# Patient Record
Sex: Male | Born: 1960 | Race: White | Hispanic: No | Marital: Single | Smoking: Former smoker
Health system: Southern US, Community
[De-identification: ages and names within clinical notes are randomized; demographics above are authoritative.]

## PROBLEM LIST (undated history)

## (undated) DIAGNOSIS — J45909 Unspecified asthma, uncomplicated: Secondary | ICD-10-CM

## (undated) DIAGNOSIS — J449 Chronic obstructive pulmonary disease, unspecified: Secondary | ICD-10-CM

---

## 2010-09-05 ENCOUNTER — Emergency Department: Payer: Self-pay | Admitting: Emergency Medicine

## 2011-08-06 DIAGNOSIS — M25579 Pain in unspecified ankle and joints of unspecified foot: Secondary | ICD-10-CM | POA: Diagnosis not present

## 2011-08-06 DIAGNOSIS — Z79899 Other long term (current) drug therapy: Secondary | ICD-10-CM | POA: Diagnosis not present

## 2011-08-06 DIAGNOSIS — J449 Chronic obstructive pulmonary disease, unspecified: Secondary | ICD-10-CM | POA: Diagnosis not present

## 2011-08-06 DIAGNOSIS — J984 Other disorders of lung: Secondary | ICD-10-CM | POA: Diagnosis not present

## 2011-08-06 DIAGNOSIS — J4489 Other specified chronic obstructive pulmonary disease: Secondary | ICD-10-CM | POA: Diagnosis not present

## 2011-08-06 DIAGNOSIS — R911 Solitary pulmonary nodule: Secondary | ICD-10-CM | POA: Diagnosis not present

## 2011-08-08 DIAGNOSIS — R918 Other nonspecific abnormal finding of lung field: Secondary | ICD-10-CM | POA: Diagnosis not present

## 2011-08-08 DIAGNOSIS — Z77090 Contact with and (suspected) exposure to asbestos: Secondary | ICD-10-CM | POA: Diagnosis not present

## 2011-08-08 DIAGNOSIS — J984 Other disorders of lung: Secondary | ICD-10-CM | POA: Diagnosis not present

## 2011-08-08 DIAGNOSIS — J449 Chronic obstructive pulmonary disease, unspecified: Secondary | ICD-10-CM | POA: Diagnosis not present

## 2011-08-08 DIAGNOSIS — F17200 Nicotine dependence, unspecified, uncomplicated: Secondary | ICD-10-CM | POA: Diagnosis not present

## 2011-08-08 DIAGNOSIS — R0989 Other specified symptoms and signs involving the circulatory and respiratory systems: Secondary | ICD-10-CM | POA: Diagnosis not present

## 2014-01-19 ENCOUNTER — Ambulatory Visit: Payer: Self-pay | Admitting: Family Medicine

## 2014-01-19 DIAGNOSIS — K5289 Other specified noninfective gastroenteritis and colitis: Secondary | ICD-10-CM | POA: Diagnosis not present

## 2014-01-19 LAB — CBC WITH DIFFERENTIAL/PLATELET
Basophil #: 0.1 10*3/uL (ref 0.0–0.1)
Basophil %: 1.3 %
EOS ABS: 0.2 10*3/uL (ref 0.0–0.7)
Eosinophil %: 2.8 %
HCT: 42.7 % (ref 40.0–52.0)
HGB: 14.1 g/dL (ref 13.0–18.0)
LYMPHS PCT: 41.2 %
Lymphocyte #: 2.3 10*3/uL (ref 1.0–3.6)
MCH: 30 pg (ref 26.0–34.0)
MCHC: 33 g/dL (ref 32.0–36.0)
MCV: 91 fL (ref 80–100)
MONOS PCT: 8.4 %
Monocyte #: 0.5 x10 3/mm (ref 0.2–1.0)
NEUTROS ABS: 2.6 10*3/uL (ref 1.4–6.5)
NEUTROS PCT: 46.3 %
Platelet: 268 10*3/uL (ref 150–440)
RBC: 4.7 10*6/uL (ref 4.40–5.90)
RDW: 13.7 % (ref 11.5–14.5)
WBC: 5.6 10*3/uL (ref 3.8–10.6)

## 2014-01-19 LAB — BASIC METABOLIC PANEL
ANION GAP: 7 (ref 7–16)
BUN: 11 mg/dL (ref 7–18)
CALCIUM: 8.7 mg/dL (ref 8.5–10.1)
CREATININE: 0.94 mg/dL (ref 0.60–1.30)
Chloride: 106 mmol/L (ref 98–107)
Co2: 31 mmol/L (ref 21–32)
EGFR (African American): >60
EGFR (Non-African Amer.): >60
GLUCOSE: 96 mg/dL (ref 65–99)
Osmolality: 286 (ref 275–301)
Potassium: 4.1 mmol/L (ref 3.5–5.1)
SODIUM: 144 mmol/L (ref 136–145)

## 2014-01-19 LAB — OCCULT BLOOD X 1 CARD TO LAB, STOOL: Occult Blood, Feces: NEGATIVE

## 2014-07-19 ENCOUNTER — Ambulatory Visit: Payer: Self-pay

## 2014-07-19 DIAGNOSIS — M5430 Sciatica, unspecified side: Secondary | ICD-10-CM | POA: Diagnosis not present

## 2017-04-27 DIAGNOSIS — Z23 Encounter for immunization: Secondary | ICD-10-CM | POA: Diagnosis not present

## 2017-04-27 DIAGNOSIS — F17210 Nicotine dependence, cigarettes, uncomplicated: Secondary | ICD-10-CM | POA: Diagnosis not present

## 2017-04-27 DIAGNOSIS — S66321A Laceration of extensor muscle, fascia and tendon of left index finger at wrist and hand level, initial encounter: Secondary | ICD-10-CM | POA: Diagnosis not present

## 2017-04-27 DIAGNOSIS — T797XXA Traumatic subcutaneous emphysema, initial encounter: Secondary | ICD-10-CM | POA: Diagnosis not present

## 2017-04-27 DIAGNOSIS — Z96669 Presence of unspecified artificial ankle joint: Secondary | ICD-10-CM | POA: Diagnosis not present

## 2017-04-27 DIAGNOSIS — S61211A Laceration without foreign body of left index finger without damage to nail, initial encounter: Secondary | ICD-10-CM | POA: Diagnosis not present

## 2017-04-27 DIAGNOSIS — S61419A Laceration without foreign body of unspecified hand, initial encounter: Secondary | ICD-10-CM | POA: Diagnosis not present

## 2017-05-09 DIAGNOSIS — S61218A Laceration without foreign body of other finger without damage to nail, initial encounter: Secondary | ICD-10-CM | POA: Diagnosis not present

## 2017-05-09 DIAGNOSIS — S66321A Laceration of extensor muscle, fascia and tendon of left index finger at wrist and hand level, initial encounter: Secondary | ICD-10-CM | POA: Diagnosis not present

## 2017-05-09 DIAGNOSIS — F17210 Nicotine dependence, cigarettes, uncomplicated: Secondary | ICD-10-CM | POA: Diagnosis not present

## 2017-05-09 DIAGNOSIS — M6789 Other specified disorders of synovium and tendon, multiple sites: Secondary | ICD-10-CM | POA: Diagnosis not present

## 2017-05-09 DIAGNOSIS — Z96669 Presence of unspecified artificial ankle joint: Secondary | ICD-10-CM | POA: Diagnosis not present

## 2017-05-09 DIAGNOSIS — S56422A Laceration of extensor muscle, fascia and tendon of left index finger at forearm level, initial encounter: Secondary | ICD-10-CM | POA: Diagnosis not present

## 2017-06-18 DIAGNOSIS — M67843 Other specified disorders of tendon, right hand: Secondary | ICD-10-CM | POA: Diagnosis not present

## 2017-06-18 DIAGNOSIS — Z4789 Encounter for other orthopedic aftercare: Secondary | ICD-10-CM | POA: Diagnosis not present

## 2017-06-18 DIAGNOSIS — M6789 Other specified disorders of synovium and tendon, multiple sites: Secondary | ICD-10-CM | POA: Diagnosis not present

## 2017-09-04 ENCOUNTER — Encounter: Payer: Self-pay | Admitting: Emergency Medicine

## 2017-09-04 ENCOUNTER — Emergency Department
Admission: EM | Admit: 2017-09-04 | Discharge: 2017-09-04 | Disposition: A | Discharge status: Home / Self Care | Payer: Medicare Other | Attending: Emergency Medicine | Admitting: Emergency Medicine

## 2017-09-04 DIAGNOSIS — Z209 Contact with and (suspected) exposure to unspecified communicable disease: Secondary | ICD-10-CM | POA: Insufficient documentation

## 2017-09-04 DIAGNOSIS — A084 Viral intestinal infection, unspecified: Secondary | ICD-10-CM | POA: Diagnosis not present

## 2017-09-04 DIAGNOSIS — R1084 Generalized abdominal pain: Secondary | ICD-10-CM | POA: Insufficient documentation

## 2017-09-04 DIAGNOSIS — R197 Diarrhea, unspecified: Secondary | ICD-10-CM | POA: Diagnosis not present

## 2017-09-04 DIAGNOSIS — R6883 Chills (without fever): Secondary | ICD-10-CM | POA: Insufficient documentation

## 2017-09-04 DIAGNOSIS — M7918 Myalgia, other site: Secondary | ICD-10-CM | POA: Diagnosis not present

## 2017-09-04 DIAGNOSIS — R112 Nausea with vomiting, unspecified: Secondary | ICD-10-CM | POA: Diagnosis present

## 2017-09-04 LAB — COMPREHENSIVE METABOLIC PANEL
ALBUMIN: 3.7 g/dL (ref 3.5–5.0)
ALT: 23 U/L (ref 17–63)
ANION GAP: 14 (ref 5–15)
AST: 33 U/L (ref 15–41)
Alkaline Phosphatase: 136 U/L — ABNORMAL HIGH (ref 38–126)
BUN: 19 mg/dL (ref 6–20)
CO2: 22 mmol/L (ref 22–32)
Calcium: 9.1 mg/dL (ref 8.9–10.3)
Chloride: 105 mmol/L (ref 101–111)
Creatinine, Ser: 1.16 mg/dL (ref 0.61–1.24)
GFR calc Af Amer: >60 mL/min (ref >60)
GFR calc non Af Amer: >60 mL/min (ref >60)
GLUCOSE: 192 mg/dL — AB (ref 65–99)
POTASSIUM: 4.1 mmol/L (ref 3.5–5.1)
SODIUM: 141 mmol/L (ref 135–145)
TOTAL PROTEIN: 8.2 g/dL — AB (ref 6.5–8.1)
Total Bilirubin: 0.6 mg/dL (ref 0.3–1.2)

## 2017-09-04 LAB — CBC
HEMATOCRIT: 43 % (ref 40.0–52.0)
Hemoglobin: 14.4 g/dL (ref 13.0–18.0)
MCH: 30.7 pg (ref 26.0–34.0)
MCHC: 33.6 g/dL (ref 32.0–36.0)
MCV: 91.5 fL (ref 80.0–100.0)
Platelets: 465 10*3/uL — ABNORMAL HIGH (ref 150–440)
RBC: 4.7 MIL/uL (ref 4.40–5.90)
RDW: 14.1 % (ref 11.5–14.5)
WBC: 15.7 10*3/uL — AB (ref 3.8–10.6)

## 2017-09-04 LAB — URINALYSIS, COMPLETE (UACMP) WITH MICROSCOPIC
BACTERIA UA: NONE SEEN
BILIRUBIN URINE: NEGATIVE
Glucose, UA: NEGATIVE mg/dL
Hgb urine dipstick: NEGATIVE
KETONES UR: NEGATIVE mg/dL
LEUKOCYTES UA: NEGATIVE
Nitrite: NEGATIVE
PROTEIN: 30 mg/dL — AB
Specific Gravity, Urine: 1.026 (ref 1.005–1.030)
pH: 7 (ref 5.0–8.0)

## 2017-09-04 LAB — LIPASE, BLOOD: LIPASE: 23 U/L (ref 11–51)

## 2017-09-04 MED ORDER — ONDANSETRON 4 MG PO TBDP
4.0000 mg | ORAL_TABLET | Freq: Once | ORAL | Status: AC | PRN
  Administered 2017-09-04: 4 mg via ORAL
  Filled 2017-09-04 (×2): qty 1

## 2017-09-04 MED ORDER — LOPERAMIDE HCL 2 MG PO TABS: 4.0000 mg | ORAL_TABLET | Freq: Four times a day (QID) | ORAL | 0 refills | Status: DC | PRN

## 2017-09-04 MED ORDER — ONDANSETRON 4 MG PO TBDP: 4.0000 mg | ORAL_TABLET | Freq: Three times a day (TID) | ORAL | 0 refills | Status: DC | PRN

## 2017-09-04 MED ORDER — FAMOTIDINE 20 MG PO TABS: 20.0000 mg | ORAL_TABLET | Freq: Two times a day (BID) | ORAL | 0 refills | Status: AC

## 2017-09-04 MED ORDER — FAMOTIDINE 20 MG PO TABS
40.0000 mg | ORAL_TABLET | Freq: Once | ORAL | Status: AC
  Administered 2017-09-04: 40 mg via ORAL
  Filled 2017-09-04: qty 2

## 2017-09-04 NOTE — ED Provider Notes (Addendum)
[facility] Emergency Department Provider Note  ____________________________________________  Time seen: Approximately 12:34 PM  I have reviewed the triage vital signs and the nursing notes.   HISTORY  Chief Complaint Abdominal Pain; Emesis; and Diarrhea    HPI Peter Page is a 57 y.o. male who complains of nausea vomiting and diarrhea that started at 3 AM this morning. Generalized cramping discomfort but denies pain. Nonradiating, no aggravating or alleviating factors at home. Does have sick contacts at home with children having vomiting and diarrhea 2 days ago as well. Also complains of chills and body aches. Denies any significant past medical history. Doesn't take any medicines, no allergies. Symptoms rated as moderate intensity.     History reviewed. No pertinent past medical history. GERD  There are no active problems to display for this patient.    History reviewed. No pertinent surgical history.   Prior to Admission medications   Medication Sig Start Date End Date Taking? Authorizing Provider  famotidine (PEPCID) 20 MG tablet Take 1 tablet (20 mg total) by mouth 2 (two) times daily. 09/04/17   Sharman CheekStafford, Charnell Peplinski, MD  loperamide (IMODIUM A-D) 2 MG tablet Take 2 tablets (4 mg total) by mouth 4 (four) times daily as needed for diarrhea or loose stools. 09/04/17   Sharman CheekStafford, Oshay Stranahan, MD  ondansetron (ZOFRAN ODT) 4 MG disintegrating tablet Take 1 tablet (4 mg total) by mouth every 8 (eight) hours as needed for nausea or vomiting. 09/04/17   Sharman CheekStafford, Johnni Wunschel, MD     Allergies Patient has no allergy information on record.   No family history on file.  Social History Social History   Tobacco Use  . Smoking status: Not on file  Substance Use Topics  . Alcohol use: Not on file  . Drug use: Not on file  Smokes, denies alcohol. No drug use.  Review of Systems  Constitutional:   No fever positive chills.  ENT:   No sore throat. No  rhinorrhea. Cardiovascular:   No chest pain or syncope. Respiratory:   No dyspnea or cough. Gastrointestinal:   Positive abdominal cramping, vomiting, and diarrhea  Musculoskeletal:   Negative for focal pain or swelling. Positive body aches All other systems reviewed and are negative except as documented above in ROS and HPI.  ____________________________________________   PHYSICAL EXAM:  VITAL SIGNS: ED Triage Vitals [09/04/17 0835]  Enc Vitals Group     BP (!) 156/101     Pulse Rate 65     Resp (!) 22     Temp      Temp src      SpO2 100 %     Weight 125 lb (56.7 kg)     Height 5\' 4"  (1.626 m)     Head Circumference      Peak Flow      Pain Score 7     Pain Loc      Pain Edu?      Excl. in GC?     Vital signs reviewed, nursing assessments reviewed.   Constitutional:   Alert and oriented. Well appearing and in no distress. Eyes:   No scleral icterus.  EOMI. No nystagmus. No conjunctival pallor. PERRL. ENT   Head:   Normocephalic and atraumatic.   Nose:   No congestion/rhinnorhea.    Mouth/Throat:   MMM, no pharyngeal erythema. No peritonsillar mass.    Neck:   No meningismus. Full ROM. Hematological/Lymphatic/Immunilogical:   No cervical lymphadenopathy. Cardiovascular:   RRR. Symmetric bilateral radial  and DP pulses.  No murmurs.  Respiratory:   Normal respiratory effort without tachypnea/retractions. Breath sounds are clear and equal bilaterally. No wheezes/rales/rhonchi. Gastrointestinal:   Soft without focal tenderness. Non distended. There is no CVA tenderness.  No rebound, rigidity, or guarding. Genitourinary:   deferred Musculoskeletal:   Normal range of motion in all extremities. No joint effusions.  No lower extremity tenderness.  No edema. Neurologic:   Normal speech and language.  Motor grossly intact. No acute focal neurologic deficits are appreciated.  Skin:    Skin is warm, dry and intact. No rash noted.  No petechiae, purpura, or  bullae.  ____________________________________________    LABS (pertinent positives/negatives) (all labs ordered are listed, but only abnormal results are displayed) Labs Reviewed  COMPREHENSIVE METABOLIC PANEL - Abnormal; Notable for the following components:      Result Value   Glucose, Bld 192 (*)    Total Protein 8.2 (*)    Alkaline Phosphatase 136 (*)    All other components within normal limits  CBC - Abnormal; Notable for the following components:   WBC 15.7 (*)    Platelets 465 (*)    All other components within normal limits  LIPASE, BLOOD  URINALYSIS, COMPLETE (UACMP) WITH MICROSCOPIC   ____________________________________________   EKG    ____________________________________________    RADIOLOGY  No results found.  ____________________________________________   PROCEDURES Procedures  ____________________________________________    CLINICAL IMPRESSION / ASSESSMENT AND PLAN / ED COURSE  Pertinent labs & imaging results that were available during my care of the patient were reviewed by me and considered in my medical decision making (see chart for details).   Is well-appearing no acute distress, presents with nausea vomiting diarrhea, similar to symptoms other household members have over the past few days. Consistent with acute viral gastroenteritis.Considering the patient's symptoms, medical history, and physical examination today, I have low suspicion for cholecystitis or biliary pathology, pancreatitis, perforation or bowel obstruction, hernia, intra-abdominal abscess, AAA or dissection, volvulus or intussusception, mesenteric ischemia, or appendicitis.  Patient was given Phenergan at triage, now nausea feels resolved. Feels much better. We'll by mouth trial, suitable for outpatient follow-up.  Clinical Course as of Sep 05 1455  Wed Sep 04, 2017  1244 UA ketones negative. Somewhat concentrated. No signs of significant dehydration at this time.  Suitable for DC if tolerating PO.  Specific Gravity, Urine: 1.026 [PS]    Clinical Course User Index [PS] Sharman Cheek, MD     ____________________________________________   FINAL CLINICAL IMPRESSION(S) / ED DIAGNOSES    Final diagnoses:  Viral gastroenteritis     ED Discharge Orders        Ordered    ondansetron (ZOFRAN ODT) 4 MG disintegrating tablet  Every 8 hours PRN     09/04/17 1233    famotidine (PEPCID) 20 MG tablet  2 times daily     09/04/17 1233    loperamide (IMODIUM A-D) 2 MG tablet  4 times daily PRN     09/04/17 1233      Portions of this note were generated with dragon dictation software. Dictation errors may occur despite best attempts at proofreading.    Sharman Cheek, MD 09/04/17 1237    Sharman Cheek, MD 09/04/17 [phone]

## 2017-09-04 NOTE — ED Notes (Signed)
Pt unable to urinate at this moment, given specimen cup and sent to [facility]. Pt in triage stating "I need something to drink bad." Pt advised not to drink anything but does so after being told multiple times not to.

## 2017-09-04 NOTE — ED Notes (Signed)

## 2017-09-04 NOTE — ED Triage Notes (Signed)
Pt reports NVD and abdominal cramping since this morning. Pt unable to sit still in triage. Pt asked to not drink mountain dew, states he has to.

## 2020-05-15 ENCOUNTER — Emergency Department: Payer: Medicare Other

## 2020-05-15 ENCOUNTER — Inpatient Hospital Stay
Admission: EM | Admit: 2020-05-15 | Discharge: 2020-05-19 | DRG: 065 | Disposition: A | Discharge status: Home / Self Care | Payer: Medicare Other | Attending: Internal Medicine | Admitting: Internal Medicine

## 2020-05-15 ENCOUNTER — Other Ambulatory Visit: Payer: Self-pay

## 2020-05-15 DIAGNOSIS — J449 Chronic obstructive pulmonary disease, unspecified: Secondary | ICD-10-CM | POA: Diagnosis present

## 2020-05-15 DIAGNOSIS — R519 Headache, unspecified: Secondary | ICD-10-CM

## 2020-05-15 DIAGNOSIS — I63549 Cerebral infarction due to unspecified occlusion or stenosis of unspecified cerebellar artery: Secondary | ICD-10-CM | POA: Diagnosis not present

## 2020-05-15 DIAGNOSIS — I639 Cerebral infarction, unspecified: Secondary | ICD-10-CM

## 2020-05-15 DIAGNOSIS — R29700 NIHSS score 0: Secondary | ICD-10-CM | POA: Diagnosis present

## 2020-05-15 DIAGNOSIS — R278 Other lack of coordination: Secondary | ICD-10-CM | POA: Diagnosis present

## 2020-05-15 DIAGNOSIS — E440 Moderate protein-calorie malnutrition: Secondary | ICD-10-CM | POA: Insufficient documentation

## 2020-05-15 DIAGNOSIS — I77810 Thoracic aortic ectasia: Secondary | ICD-10-CM | POA: Diagnosis present

## 2020-05-15 DIAGNOSIS — Z87891 Personal history of nicotine dependence: Secondary | ICD-10-CM

## 2020-05-15 DIAGNOSIS — E876 Hypokalemia: Secondary | ICD-10-CM | POA: Diagnosis present

## 2020-05-15 DIAGNOSIS — Z6821 Body mass index (BMI) 21.0-21.9, adult: Secondary | ICD-10-CM

## 2020-05-15 DIAGNOSIS — R110 Nausea: Secondary | ICD-10-CM

## 2020-05-15 DIAGNOSIS — R42 Dizziness and giddiness: Secondary | ICD-10-CM

## 2020-05-15 DIAGNOSIS — I771 Stricture of artery: Secondary | ICD-10-CM

## 2020-05-15 DIAGNOSIS — I959 Hypotension, unspecified: Secondary | ICD-10-CM | POA: Diagnosis present

## 2020-05-15 DIAGNOSIS — I10 Essential (primary) hypertension: Secondary | ICD-10-CM | POA: Diagnosis present

## 2020-05-15 DIAGNOSIS — Z20822 Contact with and (suspected) exposure to covid-19: Secondary | ICD-10-CM | POA: Diagnosis present

## 2020-05-15 DIAGNOSIS — Z716 Tobacco abuse counseling: Secondary | ICD-10-CM

## 2020-05-15 DIAGNOSIS — I708 Atherosclerosis of other arteries: Secondary | ICD-10-CM | POA: Diagnosis present

## 2020-05-15 DIAGNOSIS — R7301 Impaired fasting glucose: Secondary | ICD-10-CM | POA: Diagnosis present

## 2020-05-15 DIAGNOSIS — F17290 Nicotine dependence, other tobacco product, uncomplicated: Secondary | ICD-10-CM | POA: Diagnosis present

## 2020-05-15 DIAGNOSIS — R930 Abnormal findings on diagnostic imaging of skull and head, not elsewhere classified: Secondary | ICD-10-CM

## 2020-05-15 DIAGNOSIS — Z79899 Other long term (current) drug therapy: Secondary | ICD-10-CM

## 2020-05-15 HISTORY — DX: Chronic obstructive pulmonary disease, unspecified: J44.9

## 2020-05-15 HISTORY — DX: Unspecified asthma, uncomplicated: J45.909

## 2020-05-15 LAB — COMPREHENSIVE METABOLIC PANEL
ALT: 20 U/L (ref 0–44)
AST: 31 U/L (ref 15–41)
Albumin: 3.6 g/dL (ref 3.5–5.0)
Alkaline Phosphatase: 72 U/L (ref 38–126)
Anion gap: 12 (ref 5–15)
BUN: 12 mg/dL (ref 6–20)
CO2: 21 mmol/L — ABNORMAL LOW (ref 22–32)
Calcium: 8.2 mg/dL — ABNORMAL LOW (ref 8.9–10.3)
Chloride: 107 mmol/L (ref 98–111)
Creatinine, Ser: 0.91 mg/dL (ref 0.61–1.24)
GFR, Estimated: >60 mL/min (ref >60)
Glucose, Bld: 145 mg/dL — ABNORMAL HIGH (ref 70–99)
Potassium: 3.7 mmol/L (ref 3.5–5.1)
Sodium: 140 mmol/L (ref 135–145)
Total Bilirubin: 0.8 mg/dL (ref 0.3–1.2)
Total Protein: 6.6 g/dL (ref 6.5–8.1)

## 2020-05-15 LAB — RESPIRATORY PANEL BY RT PCR (FLU A&B, COVID)
Influenza A by PCR: NEGATIVE
Influenza B by PCR: NEGATIVE
SARS Coronavirus 2 by RT PCR: NEGATIVE

## 2020-05-15 LAB — CBC WITH DIFFERENTIAL/PLATELET
Abs Immature Granulocytes: 0.02 10*3/uL (ref 0.00–0.07)
Basophils Absolute: 0.1 10*3/uL (ref 0.0–0.1)
Basophils Relative: 1 %
Eosinophils Absolute: 0 10*3/uL (ref 0.0–0.5)
Eosinophils Relative: 0 %
HCT: 41.3 % (ref 39.0–52.0)
Hemoglobin: 14 g/dL (ref 13.0–17.0)
Immature Granulocytes: 0 %
Lymphocytes Relative: 22 %
Lymphs Abs: 1.9 10*3/uL (ref 0.7–4.0)
MCH: 31.3 pg (ref 26.0–34.0)
MCHC: 33.9 g/dL (ref 30.0–36.0)
MCV: 92.4 fL (ref 80.0–100.0)
Monocytes Absolute: 0.5 10*3/uL (ref 0.1–1.0)
Monocytes Relative: 5 %
Neutro Abs: 6.2 10*3/uL (ref 1.7–7.7)
Neutrophils Relative %: 72 %
Platelets: 304 10*3/uL (ref 150–400)
RBC: 4.47 MIL/uL (ref 4.22–5.81)
RDW: 13.5 % (ref 11.5–15.5)
WBC: 8.6 10*3/uL (ref 4.0–10.5)
nRBC: 0 % (ref 0.0–0.2)

## 2020-05-15 LAB — ETHANOL: Alcohol, Ethyl (B): <10 mg/dL (ref <10)

## 2020-05-15 LAB — LIPASE, BLOOD: Lipase: 21 U/L (ref 11–51)

## 2020-05-15 LAB — TROPONIN I (HIGH SENSITIVITY)
Troponin I (High Sensitivity): 5 ng/L (ref <18)
Troponin I (High Sensitivity): 6 ng/L (ref <18)

## 2020-05-15 MED ORDER — PROCHLORPERAZINE EDISYLATE 10 MG/2ML IJ SOLN
10.0000 mg | Freq: Once | INTRAMUSCULAR | Status: AC
  Administered 2020-05-15: 10 mg via INTRAVENOUS

## 2020-05-15 MED ORDER — POTASSIUM CHLORIDE 20 MEQ PO PACK
40.0000 meq | PACK | Freq: Once | ORAL | Status: AC
  Administered 2020-05-15: 40 meq via ORAL
  Filled 2020-05-15: qty 2

## 2020-05-15 MED ORDER — LORAZEPAM 2 MG/ML IJ SOLN
INTRAMUSCULAR | Status: AC
  Filled 2020-05-15: qty 1

## 2020-05-15 MED ORDER — LORAZEPAM 2 MG/ML IJ SOLN
2.0000 mg | Freq: Once | INTRAMUSCULAR | Status: AC
  Administered 2020-05-15: 2 mg via INTRAVENOUS

## 2020-05-15 MED ORDER — MECLIZINE HCL 25 MG PO TABS
25.0000 mg | ORAL_TABLET | Freq: Once | ORAL | Status: AC
  Administered 2020-05-15: 25 mg via ORAL

## 2020-05-15 MED ORDER — SODIUM CHLORIDE 0.9 % IV BOLUS
1000.0000 mL | Freq: Once | INTRAVENOUS | Status: AC
  Administered 2020-05-15: 1000 mL via INTRAVENOUS

## 2020-05-15 MED ORDER — THIAMINE HCL 100 MG/ML IJ SOLN
Freq: Once | INTRAVENOUS | Status: AC
  Filled 2020-05-15: qty 1000

## 2020-05-15 NOTE — ED Triage Notes (Addendum)
Pt from home via [facility]. C/o syncopal episode whitnessed by family. Pt on porch upon EMS arrival. Pt c/i dizziness upon standing. Pt able to move self to stretcher.  Pt states " I drank all night and wish I hadn't", pt denies Etoh use today.   Pt c/o N/V, given 4mg  zofran on scene and NS.

## 2020-05-15 NOTE — ED Notes (Signed)
Patient refusing temp at this time.

## 2020-05-15 NOTE — ED Notes (Signed)
Patient transported to MRI 

## 2020-05-15 NOTE — ED Provider Notes (Addendum)
[facility] Emergency Department Provider Note   ____________________________________________   First MD Initiated Contact with Patient 05/15/20 1540     (approximate)  I have reviewed the triage vital signs and the nursing notes.   HISTORY  Chief Complaint Loss of Consciousness    HPI Peter Page is a 59 y.o. male patient passed out today.  He tells me that he was very vertiginous when he stood up and passed out for that reason.  He had been drinking a lot but stopped 2 days ago.  He had some vertigo 2 days ago as well but it got better and then came back on today has been constant since this morning he is nauseated as well.  No other complaints just that everything is spinning and this makes him very nauseated.   pt says slater the symptoms started at 8 or 9 am.      Past Medical History:  Diagnosis Date  . Asthma   . COPD (chronic obstructive pulmonary disease) (HCC)     There are no problems to display for this patient.   History reviewed. No pertinent surgical history.  Prior to Admission medications   Medication Sig Start Date End Date Taking? Authorizing Provider  famotidine (PEPCID) 20 MG tablet Take 1 tablet (20 mg total) by mouth 2 (two) times daily. 09/04/17   Sharman CheekStafford, Phillip, MD  loperamide (IMODIUM A-D) 2 MG tablet Take 2 tablets (4 mg total) by mouth 4 (four) times daily as needed for diarrhea or loose stools. 09/04/17   Sharman CheekStafford, Phillip, MD  ondansetron (ZOFRAN ODT) 4 MG disintegrating tablet Take 1 tablet (4 mg total) by mouth every 8 (eight) hours as needed for nausea or vomiting. 09/04/17   Sharman CheekStafford, Phillip, MD    Allergies Patient has no known allergies.  History reviewed. No pertinent family history.  Social History Social History   Tobacco Use  . Smoking status: Former Smoker    Types: Cigarettes    Quit date: 2020    Years since quitting: 1.8  . Smokeless tobacco: Never Used  Vaping Use  . Vaping Use: Former   Substance Use Topics  . Alcohol use: Yes    Comment: "i did all night I wish I hadn't"  . Drug use: Yes    Types: Marijuana    Comment: daily    Review of Systems  Constitutional: No fever/chills Eyes: No visual changes. ENT: No sore throat. Cardiovascular: Denies chest pain. Respiratory: Denies shortness of breath. Gastrointestinal: No abdominal pain.  No nausea, no vomiting.  No diarrhea.  No constipation. Genitourinary: Negative for dysuria. Musculoskeletal: Negative for back pain. Skin: Negative for rash. Neurological: Negative for headaches, focal weakness   ____________________________________________   PHYSICAL EXAM:  VITAL SIGNS: ED Triage Vitals  Enc Vitals Group     BP --      Pulse Rate 05/15/20 1507 (!) 46     Resp 05/15/20 1507 16     Temp --      Temp src --      SpO2 05/15/20 1507 97 %     Weight 05/15/20 1508 127 lb 13.9 oz (58 kg)     Height 05/15/20 1508 5\' 4"  (1.626 m)     Head Circumference --      Peak Flow --      Pain Score 05/15/20 1508 0     Pain Loc --      Pain Edu? --      Excl. in  GC? --     Constitutional: Alert and oriented.  Curled up in a ball not moving Eyes: Conjunctivae are normal. PER. EOMI. he has nystagmus Head: Atraumatic. Nose: No congestion/rhinnorhea. Mouth/Throat: Mucous membranes are moist.  Oropharynx non-erythematous. Neck: No stridor.   Cardiovascular: Normal rate, regular rhythm. Grossly normal heart sounds.  Good peripheral circulation. Respiratory: Normal respiratory effort.  No retractions. Lungs CTAB. Gastrointestinal: Soft and nontender. No distention. No abdominal bruits. No CVA tenderness. Musculoskeletal: No lower extremity tenderness nor edema.   Neurologic:  Normal speech and language. No gross focal neurologic deficits are appreciated.  Cranial nerves II through XII appear to be intact although visual fields were not checked any as the nystagmus.  Motor strength is 5/5 throughout patient does not  report any numbness cerebellar rapid alternating movements and hands is normal Skin:  Skin is warm, dry and intact. No rash noted.   ____________________________________________   LABS (all labs ordered are listed, but only abnormal results are displayed)  Labs Reviewed  COMPREHENSIVE METABOLIC PANEL - Abnormal; Notable for the following components:      Result Value   CO2 21 (*)    Glucose, Bld 145 (*)    Calcium 8.2 (*)    All other components within normal limits  RESPIRATORY PANEL BY RT PCR (FLU A&B, COVID)  LIPASE, BLOOD  CBC WITH DIFFERENTIAL/PLATELET  ETHANOL  URINALYSIS, COMPLETE (UACMP) WITH MICROSCOPIC  URINE DRUG SCREEN, QUALITATIVE ([facility] ONLY)  PROTIME-INR  APTT  URINALYSIS, ROUTINE W REFLEX MICROSCOPIC  TROPONIN I (HIGH SENSITIVITY)  TROPONIN I (HIGH SENSITIVITY)   ____________________________________________  EKG  EKG read interpreted by me shows normal sinus rhythm rate of 52 normal axis no acute ST-T wave changes are seen  On the cardiac monitor patient becomes bradycardic intermittently going down as low as 44. ____________________________________________  RADIOLOGY Jill Poling, personally viewed and evaluated these images (plain radiographs) as part of my medical decision making, as well as reviewing the written report by the radiologist.  ED MD interpretation: Chest x-ray and orbital x-rays do not show any foreign bodies no acute changes or findings  Official radiology report(s): DG Orbits  Result Date: 05/15/2020 CLINICAL DATA:  Preprocedure clearance for MRI, syncope, dizziness EXAM: ORBITS - COMPLETE 4+ VIEW COMPARISON:  None. FINDINGS: There is no evidence of metallic foreign body within the orbits. No significant bone abnormality identified. IMPRESSION: No evidence of metallic foreign body within the orbits. Electronically Signed   By: Sharlet Salina M.D.   On: 05/15/2020 22:35   MR ANGIO HEAD WO CONTRAST  Result Date:  05/16/2020 CLINICAL DATA:  Dizziness, vertigo EXAM: MRI HEAD WITHOUT CONTRAST MRA HEAD WITHOUT CONTRAST TECHNIQUE: Multiplanar, multiecho pulse sequences of the brain and surrounding structures were obtained without intravenous contrast. Angiographic images of the head were obtained using MRA technique without contrast. COMPARISON:  None. FINDINGS: MRI HEAD FINDINGS Brain: Multifocal acute ischemia within both cerebellar hemispheres. Multifocal hyperintense T2-weighted signal within the white matter. Normal volume of CSF spaces. No chronic microhemorrhage. Normal midline structures. Vascular: Normal flow voids. Skull and upper cervical spine: Normal marrow signal. Sinuses/Orbits: Negative. Other: None. MRA HEAD FINDINGS POSTERIOR CIRCULATION: --Vertebral arteries: Left vertebral artery terminates in PICA. --Inferior cerebellar arteries: Intermittent loss of enhancement in the left PICA. No right PICA enhancement visualized. --Basilar artery: Normal. --Superior cerebellar arteries: Normal. --Posterior cerebral arteries: Normal. ANTERIOR CIRCULATION: --Intracranial internal carotid arteries: Normal. --Anterior cerebral arteries (ACA): Normal. --Middle cerebral arteries (MCA): Normal. ANATOMIC VARIANTS: Fetal origin of the  right PCA. IMPRESSION: 1. Multifocal acute ischemia within both cerebellar hemispheres. No hemorrhage or mass effect. 2. 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 Electronically Signed   By: Deatra Robinson M.D.   On: 05/16/2020 00:07   MR BRAIN WO CONTRAST  Result Date: 05/16/2020 CLINICAL DATA:  Dizziness, vertigo EXAM: MRI HEAD WITHOUT CONTRAST MRA HEAD WITHOUT CONTRAST TECHNIQUE: Multiplanar, multiecho pulse sequences of the brain and surrounding structures were obtained without intravenous contrast. Angiographic images of the head were obtained using MRA technique without contrast. COMPARISON:  None. FINDINGS: MRI HEAD FINDINGS Brain: Multifocal acute ischemia within both  cerebellar hemispheres. Multifocal hyperintense T2-weighted signal within the white matter. Normal volume of CSF spaces. No chronic microhemorrhage. Normal midline structures. Vascular: Normal flow voids. Skull and upper cervical spine: Normal marrow signal. Sinuses/Orbits: Negative. Other: None. MRA HEAD FINDINGS POSTERIOR CIRCULATION: --Vertebral arteries: Left vertebral artery terminates in PICA. --Inferior cerebellar arteries: Intermittent loss of enhancement in the left PICA. No right PICA enhancement visualized. --Basilar artery: Normal. --Superior cerebellar arteries: Normal. --Posterior cerebral arteries: Normal. ANTERIOR CIRCULATION: --Intracranial internal carotid arteries: Normal. --Anterior cerebral arteries (ACA): Normal. --Middle cerebral arteries (MCA): Normal. ANATOMIC VARIANTS: Fetal origin of the right PCA. IMPRESSION: 1. Multifocal acute ischemia within both cerebellar hemispheres. No hemorrhage or mass effect. 2. 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 Electronically Signed   By: Deatra Robinson M.D.   On: 05/16/2020 00:07   DG Chest Portable 1 View  Result Date: 05/15/2020 CLINICAL DATA:  Syncope, dizziness EXAM: PORTABLE CHEST 1 VIEW COMPARISON:  None. FINDINGS: 2 frontal views of the chest demonstrate an unremarkable cardiac silhouette. Diffuse interstitial prominence and scarring throughout the lungs compatible with emphysema. No airspace disease, effusion, or pneumothorax. No acute bony abnormalities. IMPRESSION: 1. Emphysema.  No acute airspace disease. Electronically Signed   By: Sharlet Salina M.D.   On: 05/15/2020 17:42    ____________________________________________   PROCEDURES  Procedure(s) performed (including Critical Care):  Procedures   ____________________________________________   INITIAL IMPRESSION / ASSESSMENT AND PLAN / ED COURSE  Patient comes in with vertigo since morning. Seem to be unwilling to converse several times. With this was  true with both me and the nurses other times he would converse without any trouble. When he would not converse he just grunted at [place]. There was some delay in clearing him for MRI but he was cleared for MRI and then I signed the patient out to oncoming provider. 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 Has been well past the TPA deadline. We will get him in the hospital. I should add that after the MRI patient reported he felt much better and wanted to go home.              ____________________________________________   FINAL CLINICAL IMPRESSION(S) / ED DIAGNOSES  Final diagnoses:  Abnormal MRI of head  Vertigo  Cerebrovascular accident (CVA), unspecified mechanism [facility])     ED Discharge Orders    None      *Please note:  Peter Page was evaluated in Emergency Department on 05/16/2020 for the symptoms described in the history of present illness. He was evaluated in the context of the global COVID-19 pandemic, which necessitated consideration that the patient might be at risk for infection with the SARS-CoV-2 virus that causes COVID-19. Institutional protocols and algorithms that pertain to the evaluation of patients at risk for COVID-19 are in a state of rapid change  based on information released by regulatory bodies including the CDC and federal and state organizations. These policies and algorithms were followed during the patient's care in the ED.  Some ED evaluations and interventions may be delayed as a result of limited staffing during and the pandemic.*   Note:  This document was prepared using Dragon voice recognition software and may include unintentional dictation errors.    Arnaldo Natal, MD 05/16/20 [id number]    Arnaldo Natal, MD 05/16/20 [phone]

## 2020-05-15 NOTE — ED Notes (Signed)
Patient states he feels better, denies nausea/vomitting, headache, or dizziness/lightheadedness.

## 2020-05-15 NOTE — ED Notes (Signed)
Called MRI for ETA- they state it will be about an hour- they were unable to do MRI screening d/t pt being too sleepy

## 2020-05-16 ENCOUNTER — Observation Stay: Payer: Medicare Other

## 2020-05-16 ENCOUNTER — Other Ambulatory Visit: Payer: Self-pay

## 2020-05-16 ENCOUNTER — Encounter: Payer: Self-pay | Admitting: Internal Medicine

## 2020-05-16 DIAGNOSIS — R519 Headache, unspecified: Secondary | ICD-10-CM | POA: Diagnosis not present

## 2020-05-16 DIAGNOSIS — J449 Chronic obstructive pulmonary disease, unspecified: Secondary | ICD-10-CM

## 2020-05-16 DIAGNOSIS — Z20822 Contact with and (suspected) exposure to covid-19: Secondary | ICD-10-CM | POA: Diagnosis present

## 2020-05-16 DIAGNOSIS — I6389 Other cerebral infarction: Secondary | ICD-10-CM | POA: Diagnosis not present

## 2020-05-16 DIAGNOSIS — G441 Vascular headache, not elsewhere classified: Secondary | ICD-10-CM | POA: Diagnosis not present

## 2020-05-16 DIAGNOSIS — Z6821 Body mass index (BMI) 21.0-21.9, adult: Secondary | ICD-10-CM | POA: Diagnosis not present

## 2020-05-16 DIAGNOSIS — I10 Essential (primary) hypertension: Secondary | ICD-10-CM | POA: Diagnosis not present

## 2020-05-16 DIAGNOSIS — Z87891 Personal history of nicotine dependence: Secondary | ICD-10-CM

## 2020-05-16 DIAGNOSIS — E440 Moderate protein-calorie malnutrition: Secondary | ICD-10-CM | POA: Diagnosis present

## 2020-05-16 DIAGNOSIS — I959 Hypotension, unspecified: Secondary | ICD-10-CM

## 2020-05-16 DIAGNOSIS — I639 Cerebral infarction, unspecified: Secondary | ICD-10-CM

## 2020-05-16 DIAGNOSIS — R110 Nausea: Secondary | ICD-10-CM | POA: Diagnosis not present

## 2020-05-16 DIAGNOSIS — I771 Stricture of artery: Secondary | ICD-10-CM

## 2020-05-16 DIAGNOSIS — R278 Other lack of coordination: Secondary | ICD-10-CM | POA: Diagnosis present

## 2020-05-16 DIAGNOSIS — F17290 Nicotine dependence, other tobacco product, uncomplicated: Secondary | ICD-10-CM | POA: Diagnosis present

## 2020-05-16 DIAGNOSIS — Z716 Tobacco abuse counseling: Secondary | ICD-10-CM | POA: Diagnosis not present

## 2020-05-16 DIAGNOSIS — R930 Abnormal findings on diagnostic imaging of skull and head, not elsewhere classified: Secondary | ICD-10-CM

## 2020-05-16 DIAGNOSIS — Z79899 Other long term (current) drug therapy: Secondary | ICD-10-CM | POA: Diagnosis not present

## 2020-05-16 DIAGNOSIS — I708 Atherosclerosis of other arteries: Secondary | ICD-10-CM | POA: Diagnosis present

## 2020-05-16 DIAGNOSIS — I63549 Cerebral infarction due to unspecified occlusion or stenosis of unspecified cerebellar artery: Secondary | ICD-10-CM | POA: Diagnosis present

## 2020-05-16 DIAGNOSIS — R7301 Impaired fasting glucose: Secondary | ICD-10-CM | POA: Diagnosis present

## 2020-05-16 DIAGNOSIS — R29700 NIHSS score 0: Secondary | ICD-10-CM | POA: Diagnosis present

## 2020-05-16 DIAGNOSIS — R42 Dizziness and giddiness: Secondary | ICD-10-CM

## 2020-05-16 DIAGNOSIS — I77810 Thoracic aortic ectasia: Secondary | ICD-10-CM | POA: Diagnosis present

## 2020-05-16 DIAGNOSIS — E876 Hypokalemia: Secondary | ICD-10-CM | POA: Diagnosis present

## 2020-05-16 LAB — CBC
HCT: 38.4 % — ABNORMAL LOW (ref 39.0–52.0)
Hemoglobin: 12.6 g/dL — ABNORMAL LOW (ref 13.0–17.0)
MCH: 30.8 pg (ref 26.0–34.0)
MCHC: 32.8 g/dL (ref 30.0–36.0)
MCV: 93.9 fL (ref 80.0–100.0)
Platelets: 242 10*3/uL (ref 150–400)
RBC: 4.09 MIL/uL — ABNORMAL LOW (ref 4.22–5.81)
RDW: 13.5 % (ref 11.5–15.5)
WBC: 7 10*3/uL (ref 4.0–10.5)
nRBC: 0 % (ref 0.0–0.2)

## 2020-05-16 LAB — URINE DRUG SCREEN, QUALITATIVE (ARMC ONLY)
Amphetamines, Ur Screen: NOT DETECTED
Barbiturates, Ur Screen: NOT DETECTED
Benzodiazepine, Ur Scrn: POSITIVE — AB
Cannabinoid 50 Ng, Ur ~~LOC~~: POSITIVE — AB
Cocaine Metabolite,Ur ~~LOC~~: NOT DETECTED
MDMA (Ecstasy)Ur Screen: NOT DETECTED
Methadone Scn, Ur: NOT DETECTED
Opiate, Ur Screen: NOT DETECTED
Phencyclidine (PCP) Ur S: NOT DETECTED
Tricyclic, Ur Screen: NOT DETECTED

## 2020-05-16 LAB — APTT: aPTT: 28 seconds (ref 24–36)

## 2020-05-16 LAB — URINALYSIS, ROUTINE W REFLEX MICROSCOPIC
Bilirubin Urine: NEGATIVE
Glucose, UA: NEGATIVE mg/dL
Hgb urine dipstick: NEGATIVE
Ketones, ur: NEGATIVE mg/dL
Leukocytes,Ua: NEGATIVE
Nitrite: NEGATIVE
Protein, ur: NEGATIVE mg/dL
Specific Gravity, Urine: 1.028 (ref 1.005–1.030)
pH: 6 (ref 5.0–8.0)

## 2020-05-16 LAB — LIPID PANEL
Cholesterol: 148 mg/dL (ref 0–200)
HDL: 40 mg/dL — ABNORMAL LOW (ref >40)
LDL Cholesterol: 84 mg/dL (ref 0–99)
Total CHOL/HDL Ratio: 3.7 RATIO
Triglycerides: 118 mg/dL (ref <150)
VLDL: 24 mg/dL (ref 0–40)

## 2020-05-16 LAB — HEMOGLOBIN A1C
Hgb A1c MFr Bld: 5.6 % (ref 4.8–5.6)
Mean Plasma Glucose: 114.02 mg/dL

## 2020-05-16 LAB — CREATININE, SERUM
Creatinine, Ser: 0.98 mg/dL (ref 0.61–1.24)
GFR, Estimated: >60 mL/min (ref >60)

## 2020-05-16 LAB — HIV ANTIBODY (ROUTINE TESTING W REFLEX): HIV Screen 4th Generation wRfx: NONREACTIVE

## 2020-05-16 LAB — PROTIME-INR
INR: 1 (ref 0.8–1.2)
Prothrombin Time: 12.3 seconds (ref 11.4–15.2)

## 2020-05-16 MED ORDER — SODIUM CHLORIDE 0.9 % IV SOLN: INTRAVENOUS | Status: DC

## 2020-05-16 MED ORDER — ACETAMINOPHEN 650 MG RE SUPP: 650.0000 mg | RECTAL | Status: DC | PRN

## 2020-05-16 MED ORDER — ATORVASTATIN CALCIUM 20 MG PO TABS
40.0000 mg | ORAL_TABLET | Freq: Every day | ORAL | Status: DC
  Administered 2020-05-16 – 2020-05-19 (×4): 40 mg via ORAL
  Filled 2020-05-16 (×4): qty 2

## 2020-05-16 MED ORDER — BACITRACIN-NEOMYCIN-POLYMYXIN OINTMENT TUBE
TOPICAL_OINTMENT | Freq: Two times a day (BID) | CUTANEOUS | Status: DC
  Filled 2020-05-16: qty 14.17

## 2020-05-16 MED ORDER — ACETAMINOPHEN 325 MG PO TABS
650.0000 mg | ORAL_TABLET | ORAL | Status: DC | PRN
  Administered 2020-05-17 – 2020-05-18 (×2): 650 mg via ORAL
  Filled 2020-05-16 (×2): qty 2

## 2020-05-16 MED ORDER — STROKE: EARLY STAGES OF RECOVERY BOOK: Freq: Once | Status: AC

## 2020-05-16 MED ORDER — ACETAMINOPHEN 325 MG PO TABS
650.0000 mg | ORAL_TABLET | ORAL | Status: DC | PRN
  Administered 2020-05-16: 650 mg via ORAL
  Filled 2020-05-16: qty 2

## 2020-05-16 MED ORDER — ACETAMINOPHEN 160 MG/5ML PO SOLN
650.0000 mg | ORAL | Status: DC | PRN
  Filled 2020-05-16: qty 20.3

## 2020-05-16 MED ORDER — CLOPIDOGREL BISULFATE 75 MG PO TABS
300.0000 mg | ORAL_TABLET | Freq: Once | ORAL | Status: AC
  Administered 2020-05-16: 13:00:00 300 mg via ORAL
  Filled 2020-05-16: qty 4

## 2020-05-16 MED ORDER — IOHEXOL 350 MG/ML SOLN
75.0000 mL | Freq: Once | INTRAVENOUS | Status: AC | PRN
  Administered 2020-05-16: 75 mL via INTRAVENOUS

## 2020-05-16 MED ORDER — ASPIRIN 81 MG PO CHEW
324.0000 mg | CHEWABLE_TABLET | Freq: Once | ORAL | Status: AC
  Administered 2020-05-16: 324 mg via ORAL
  Filled 2020-05-16: qty 4

## 2020-05-16 MED ORDER — ASPIRIN EC 81 MG PO TBEC
81.0000 mg | DELAYED_RELEASE_TABLET | Freq: Every day | ORAL | Status: DC
  Administered 2020-05-17 – 2020-05-19 (×3): 81 mg via ORAL
  Filled 2020-05-16 (×3): qty 1

## 2020-05-16 MED ORDER — ENOXAPARIN SODIUM 40 MG/0.4ML ~~LOC~~ SOLN
40.0000 mg | SUBCUTANEOUS | Status: DC
  Administered 2020-05-16 – 2020-05-18 (×3): 40 mg via SUBCUTANEOUS
  Filled 2020-05-16 (×3): qty 0.4

## 2020-05-16 MED ORDER — CLOPIDOGREL BISULFATE 75 MG PO TABS
75.0000 mg | ORAL_TABLET | Freq: Every day | ORAL | Status: DC
  Administered 2020-05-17 – 2020-05-19 (×3): 75 mg via ORAL
  Filled 2020-05-16 (×3): qty 1

## 2020-05-16 MED ORDER — ASPIRIN EC 325 MG PO TBEC
325.0000 mg | DELAYED_RELEASE_TABLET | Freq: Every day | ORAL | Status: DC
  Administered 2020-05-16: 12:00:00 325 mg via ORAL
  Filled 2020-05-16: qty 1

## 2020-05-16 MED ORDER — ONDANSETRON HCL 4 MG/2ML IJ SOLN
4.0000 mg | Freq: Four times a day (QID) | INTRAMUSCULAR | Status: DC | PRN
  Administered 2020-05-17 – 2020-05-18 (×3): 4 mg via INTRAVENOUS
  Filled 2020-05-16 (×3): qty 2

## 2020-05-16 MED ORDER — OXYCODONE HCL 5 MG PO TABS
5.0000 mg | ORAL_TABLET | Freq: Four times a day (QID) | ORAL | Status: DC | PRN
  Administered 2020-05-16 – 2020-05-18 (×6): 5 mg via ORAL
  Filled 2020-05-16 (×6): qty 1

## 2020-05-16 NOTE — Progress Notes (Signed)
Patient ID: Peter Page, male   DOB: 28-Feb-1961, 59 y.o.   MRN: [id number] [facility] PROGRESS NOTE  Peter Page WUJ:[id number] DOB: 28-Feb-1961 DOA: 05/15/2020 PCP: Patient, No Pcp Per  HPI/Subjective: Patient seen this morning.  Had dizziness with standing up.  Family found him on the ground.  Some nausea.  No vomiting.  No abdominal pain or chest pain.  Patient states he felt strong both extremities.  Patient not the best historian.  Objective: Vitals:   05/16/20 0830 05/16/20 1004  BP: 118/71 93/71  Pulse: 72 62  Resp:  18  Temp:  98.5 F (36.9 C)  SpO2: 97% 97%    Intake/Output Summary (Last 24 hours) at 05/16/2020 1148 Last data filed at 05/16/2020 0236 Gross per 24 hour  Intake 1000 ml  Output 250 ml  Net 750 ml   Filed Weights   05/15/20 1508  Weight: 58 kg    ROS: Review of Systems  Respiratory: Negative for cough and shortness of breath.   Cardiovascular: Negative for chest pain.  Gastrointestinal: Positive for nausea. Negative for abdominal pain and vomiting.  Neurological: Positive for dizziness.   Exam: Physical Exam HENT:     Head: Normocephalic.     Mouth/Throat:     Pharynx: No oropharyngeal exudate.  Eyes:     General: Lids are normal.     Conjunctiva/sclera: Conjunctivae normal.     Pupils: Pupils are equal, round, and reactive to light.  Cardiovascular:     Rate and Rhythm: Normal rate and regular rhythm.     Heart sounds: Normal heart sounds, S1 normal and S2 normal.  Pulmonary:     Breath sounds: No decreased breath sounds, wheezing, rhonchi or rales.  Abdominal:     Palpations: Abdomen is soft.     Tenderness: There is no abdominal tenderness.  Musculoskeletal:     Right lower leg: No swelling.     Left lower leg: No swelling.  Skin:    General: Skin is warm.     Findings: No rash.  Neurological:     Mental Status: He is alert.     Comments: Patient answers questions but had to be redirected quite a few times.    Gait not checked. Power 4+ out of 5 upper and lower extremity bilaterally. Finger-nose impaired       Data Reviewed: Basic Metabolic Panel: Recent Labs  Lab 05/15/20 1609 05/16/20 0235  NA 140  --   K 3.7  --   CL 107  --   CO2 21*  --   GLUCOSE 145*  --   BUN 12  --   CREATININE 0.91 0.98  CALCIUM 8.2*  --    Liver Function Tests: Recent Labs  Lab 05/15/20 1609  AST 31  ALT 20  ALKPHOS 72  BILITOT 0.8  PROT 6.6  ALBUMIN 3.6   Recent Labs  Lab 05/15/20 1609  LIPASE 21   CBC: Recent Labs  Lab 05/15/20 1609 05/16/20 0235  WBC 8.6 7.0  NEUTROABS 6.2  --   HGB 14.0 12.6*  HCT 41.3 38.4*  MCV 92.4 93.9  PLT 304 242     Recent Results (from the past 240 hour(s))  Respiratory Panel by RT PCR (Flu A&B, Covid) - Nasopharyngeal Swab     Status: None   Collection Time: 05/15/20  9:02 PM   Specimen: Nasopharyngeal Swab  Result Value Ref Range Status   SARS Coronavirus 2 by RT PCR NEGATIVE NEGATIVE Final  Comment: (NOTE) SARS-CoV-2 target nucleic acids are NOT DETECTED.  The SARS-CoV-2 RNA is generally detectable in upper respiratoy specimens during the acute phase of infection. The lowest concentration of SARS-CoV-2 viral copies this assay can detect is 131 copies/mL. A negative result does not preclude SARS-Cov-2 infection and should not be used as the sole basis for treatment or other patient management decisions. A negative result may occur with  improper specimen collection/handling, submission of specimen other than nasopharyngeal swab, presence of viral mutation(s) within the areas targeted by this assay, and inadequate number of viral copies (<131 copies/mL). A negative result must be combined with clinical observations, patient history, and epidemiological information. The expected result is Negative.  Fact Sheet for Patients:  https://www.moore.com/  Fact Sheet for Healthcare Providers:   https://www.young.biz/  This test is no t yet approved or cleared by the [place] FDA and  has been authorized for detection and/or diagnosis of SARS-CoV-2 by FDA under an Emergency Use Authorization (EUA). This EUA will remain  in effect (meaning this test can be used) for the duration of the COVID-19 declaration under Section 564(b)(1) of the Act, 21 U.S.C. section 360bbb-3(b)(1), unless the authorization is terminated or revoked sooner.     Influenza A by PCR NEGATIVE NEGATIVE Final   Influenza B by PCR NEGATIVE NEGATIVE Final    Comment: (NOTE) The Xpert Xpress SARS-CoV-2/FLU/RSV assay is intended as an aid in  the diagnosis of influenza from Nasopharyngeal swab specimens and  should not be used as a sole basis for treatment. Nasal washings and  aspirates are unacceptable for Xpert Xpress SARS-CoV-2/FLU/RSV  testing.  Fact Sheet for Patients: https://www.moore.com/  Fact Sheet for Healthcare Providers: https://www.young.biz/  This test is not yet approved or cleared by the [place] FDA and  has been authorized for detection and/or diagnosis of SARS-CoV-2 by  FDA under an Emergency Use Authorization (EUA). This EUA will remain  in effect (meaning this test can be used) for the duration of the  Covid-19 declaration under Section 564(b)(1) of the Act, 21  U.S.C. section 360bbb-3(b)(1), unless the authorization is  terminated or revoked. Performed at [facility], [address] [place], [place] [address]      Studies: CT Code Stroke CTA Head W/WO contrast  Result Date: 05/16/2020 CLINICAL DATA:  Dizziness and vertigo. 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 EXAM: CT ANGIOGRAPHY HEAD AND NECK TECHNIQUE: Multidetector CT imaging of the head and neck was performed using the standard protocol during bolus administration of intravenous contrast. Multiplanar CT image reconstructions  and MIPs were obtained to evaluate the vascular anatomy. Carotid stenosis measurements (when applicable) are obtained utilizing NASCET criteria, using the distal internal carotid diameter as the denominator. CONTRAST:  76mL OMNIPAQUE IOHEXOL 350 MG/ML SOLN COMPARISON:  05/15/2020 FINDINGS: CT HEAD FINDINGS Brain: Small areas of low-density visible within the cerebellum, particularly in the region of the vermis. No evidence of mass effect or hemorrhage. Cerebral hemispheres appear normal by CT. No evidence of stroke, mass, hemorrhage, hydrocephalus or extra-axial collection. Vascular: There is atherosclerotic calcification of the major vessels at the base of the brain. Skull: Negative Sinuses: Clear Orbits: Normal Review of the MIP images confirms the above findings CTA NECK FINDINGS Aortic arch: Aortic atherosclerotic calcification. Branching pattern is normal without origin stenosis. Right carotid system: Common carotid artery widely patent to the bifurcation. Soft and calcified plaque at the ICA bulb but without stenosis when compared to the more distal cervical ICA diameter. Left carotid system: Common  carotid artery widely patent to the bifurcation. No soft or calcified plaque at the bifurcation or bulb. Cervical ICA widely patent. Vertebral arteries: Right subclavian artery is normal. Right vertebral origin is widely patent. 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 This appears to be due to soft plaque. Left vertebral artery origin is widely patent. Left vertebral artery is normal through the cervical region to the foramen magnum. Skeleton: Degenerative cervical spondylosis and facet osteoarthritis. Other neck: No soft tissue mass or lymphadenopathy. Upper chest: Severe emphysema in the upper lungs. Review of the MIP images confirms the above findings CTA HEAD FINDINGS Anterior circulation: Both internal carotid arteries patent through the skull base and siphon regions.  Ordinary siphon atherosclerotic calcification but no stenosis greater than 30%. The anterior and middle cerebral vessels are patent without proximal stenosis, aneurysm or vascular malformation. No large or medium vessel occlusion. Posterior circulation: Right vertebral artery is a large vessel widely patent through the foramen magnum. There is calcified plaque affecting the right vertebral V4 segment but no stenosis. I think there is a very small right PICA. 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 No antegrade flow through the foramen magnum. No left PICA demonstrated. No basilar stenosis. Small bilateral anterior inferior cerebellar arteries show flow. Superior cerebellar and posterior cerebral arteries are patent. Venous sinuses: Patent and normal. Anatomic variants: None significant. Review of the MIP images confirms the above findings IMPRESSION: 1. 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 2. No significant carotid bifurcation disease. 3. No intracranial anterior circulation large or medium vessel occlusion. 4. 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 No antegrade flow through the foramen magnum. Right vertebral artery widely patent. No basilar stenosis. Superior cerebellar and posterior cerebral arteries are patent. No left PICA flow demonstrated. 5. Severe emphysema. Aortic Atherosclerosis (ICD10-I70.0) and Emphysema (ICD10-J43.9). Electronically Signed   By: Paulina Fusi M.D.   On: 05/16/2020 11:03   DG Orbits  Result Date: 05/15/2020 CLINICAL DATA:  Preprocedure clearance for MRI, syncope, dizziness EXAM: ORBITS - COMPLETE 4+ VIEW COMPARISON:  None. FINDINGS: There is no evidence of metallic foreign body within the orbits. No significant bone abnormality identified.  IMPRESSION: No evidence of metallic foreign body within the orbits. Electronically Signed   By: Sharlet Salina M.D.   On: 05/15/2020 22:35   CT Code Stroke CTA Neck W/WO contrast  Result Date: 05/16/2020 CLINICAL DATA:  Dizziness and vertigo. 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 EXAM: CT ANGIOGRAPHY HEAD AND NECK TECHNIQUE: Multidetector CT imaging of the head and neck was performed using the standard protocol during bolus administration of intravenous contrast. Multiplanar CT image reconstructions and MIPs were obtained to evaluate the vascular anatomy. Carotid stenosis measurements (when applicable) are obtained utilizing NASCET criteria, using the distal internal carotid diameter as the denominator. CONTRAST:  66mL OMNIPAQUE IOHEXOL 350 MG/ML SOLN COMPARISON:  05/15/2020 FINDINGS: CT HEAD FINDINGS Brain: Small areas of low-density visible within the cerebellum, particularly in the region of the vermis. No evidence of mass effect or hemorrhage. Cerebral hemispheres appear normal by CT. No evidence of stroke, mass, hemorrhage, hydrocephalus or extra-axial collection. Vascular: There is atherosclerotic calcification of the major vessels at the base of the brain. Skull: Negative Sinuses: Clear Orbits: Normal Review of the MIP images confirms the above  findings CTA NECK FINDINGS Aortic arch: Aortic atherosclerotic calcification. Branching pattern is normal without origin stenosis. Right carotid system: Common carotid artery widely patent to the bifurcation. Soft and calcified plaque at the ICA bulb but without stenosis when compared to the more distal cervical ICA diameter. Left carotid system: Common carotid artery widely patent to the bifurcation. No soft or calcified plaque at the bifurcation or bulb. Cervical ICA widely patent. Vertebral arteries: Right subclavian artery is normal. Right vertebral origin is widely patent. 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 This appears to be due to soft plaque. Left vertebral artery origin is widely patent. Left vertebral artery is normal through the cervical region to the foramen magnum. Skeleton: Degenerative cervical spondylosis and facet osteoarthritis. Other neck: No soft tissue mass or lymphadenopathy. Upper chest: Severe emphysema in the upper lungs. Review of the MIP images confirms the above findings CTA HEAD FINDINGS Anterior circulation: Both internal carotid arteries patent through the skull base and siphon regions. Ordinary siphon atherosclerotic calcification but no stenosis greater than 30%. The anterior and middle cerebral vessels are patent without proximal stenosis, aneurysm or vascular malformation. No large or medium vessel occlusion. Posterior circulation: Right vertebral artery is a large vessel widely patent through the foramen magnum. There is calcified plaque affecting the right vertebral V4 segment but no stenosis. I think there is a very small right PICA. 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 No antegrade flow through the foramen magnum. No left PICA demonstrated. No basilar stenosis. Small bilateral anterior inferior cerebellar arteries show flow. Superior cerebellar and posterior cerebral arteries are patent. Venous sinuses: Patent and normal. Anatomic variants: None significant. Review of the MIP images confirms the above findings IMPRESSION: 1. 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 2. No significant carotid bifurcation disease. 3. No intracranial anterior circulation large or medium vessel occlusion. 4. 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 No antegrade flow through the foramen magnum. Right vertebral artery widely patent. No basilar stenosis. Superior  cerebellar and posterior cerebral arteries are patent. No left PICA flow demonstrated. 5. Severe emphysema. Aortic Atherosclerosis (ICD10-I70.0) and Emphysema (ICD10-J43.9). Electronically Signed   By: Paulina Fusi M.D.   On: 05/16/2020 11:03   MR ANGIO HEAD WO CONTRAST  Result Date: 05/16/2020 CLINICAL DATA:  Dizziness, vertigo EXAM: MRI HEAD WITHOUT CONTRAST MRA HEAD WITHOUT CONTRAST TECHNIQUE: Multiplanar, multiecho pulse sequences of the brain and surrounding structures were obtained without intravenous contrast. Angiographic images of the head were obtained using MRA technique without contrast. COMPARISON:  None. FINDINGS: MRI HEAD FINDINGS Brain: Multifocal acute ischemia within both cerebellar hemispheres. Multifocal hyperintense T2-weighted signal within the white matter. Normal volume of CSF spaces. No chronic microhemorrhage. Normal midline structures. Vascular: Normal flow voids. Skull and upper cervical spine: Normal marrow signal. Sinuses/Orbits: Negative. Other: None. MRA HEAD FINDINGS POSTERIOR CIRCULATION: --Vertebral arteries: Left vertebral artery terminates in PICA. --Inferior cerebellar arteries: Intermittent loss of enhancement in the left PICA. No right PICA enhancement visualized. --Basilar artery: Normal. --Superior cerebellar arteries: Normal. --Posterior cerebral arteries: Normal. ANTERIOR CIRCULATION: --Intracranial internal carotid arteries: Normal. --Anterior cerebral arteries (ACA): Normal. --Middle cerebral arteries (MCA): Normal. ANATOMIC VARIANTS: Fetal origin of the right PCA. IMPRESSION: 1. Multifocal acute ischemia within both cerebellar hemispheres. No hemorrhage or mass effect. 2. 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 Electronically Signed   By: Deatra Robinson M.D.   On: 05/16/2020 00:07   MR BRAIN WO CONTRAST  Result Date: 05/16/2020 CLINICAL DATA:  Dizziness, vertigo EXAM: MRI HEAD WITHOUT CONTRAST MRA HEAD WITHOUT CONTRAST TECHNIQUE: Multiplanar,  multiecho pulse sequences of the brain and surrounding structures were obtained without intravenous contrast. Angiographic images of the head were obtained using MRA technique without contrast. COMPARISON:  None. FINDINGS: MRI HEAD FINDINGS Brain: Multifocal acute ischemia within both cerebellar hemispheres. Multifocal hyperintense T2-weighted signal within the white matter. Normal volume of CSF spaces. No chronic microhemorrhage. Normal midline structures. Vascular: Normal flow voids. Skull and upper cervical spine: Normal marrow signal. Sinuses/Orbits: Negative. Other: None. MRA HEAD FINDINGS POSTERIOR CIRCULATION: --Vertebral arteries: Left vertebral artery terminates in PICA. --Inferior cerebellar arteries: Intermittent loss of enhancement in the left PICA. No right PICA enhancement visualized. --Basilar artery: Normal. --Superior cerebellar arteries: Normal. --Posterior cerebral arteries: Normal. ANTERIOR CIRCULATION: --Intracranial internal carotid arteries: Normal. --Anterior cerebral arteries (ACA): Normal. --Middle cerebral arteries (MCA): Normal. ANATOMIC VARIANTS: Fetal origin of the right PCA. IMPRESSION: 1. Multifocal acute ischemia within both cerebellar hemispheres. No hemorrhage or mass effect. 2. 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 Electronically Signed   By: Deatra Robinson M.D.   On: 05/16/2020 00:07   DG Chest Portable 1 View  Result Date: 05/15/2020 CLINICAL DATA:  Syncope, dizziness EXAM: PORTABLE CHEST 1 VIEW COMPARISON:  None. FINDINGS: 2 frontal views of the chest demonstrate an unremarkable cardiac silhouette. Diffuse interstitial prominence and scarring throughout the lungs compatible with emphysema. No airspace disease, effusion, or pneumothorax. No acute bony abnormalities. IMPRESSION: 1. Emphysema.  No acute airspace disease. Electronically Signed   By: Sharlet Salina M.D.   On: 05/15/2020 17:42    Scheduled Meds: . aspirin EC  325 mg Oral Daily  .  atorvastatin  40 mg Oral Daily  . enoxaparin (LOVENOX) injection  40 mg Subcutaneous Q24H   Continuous Infusions: . sodium chloride 75 mL/hr at 05/16/20 0232    Assessment/Plan:  1. Acute bilateral cerebellar strokes.  Seen by physical therapy and they recommended rehab.  Patient given aspirin.  Neurology added Plavix load and Plavix daily.  Patient on atorvastatin.  Continue physical therapy, Occupational Therapy evaluations.  Patient passed swallow evaluation.  LDL 84.  Echocardiogram and telemetry monitoring. 2. Initial hypertension now hypotension.  We will have them check blood pressures and right arm since he does have some subclavian stenosis on the left.  Continue IV fluid hydration. 3. Left subclavian stenosis.  Will discuss with neurology on whether or not vascular needs to be consulted or not. 4. COPD seen on chest x-ray 5. Impaired fasting glucose check a hemoglobin A1c    Code Status:     Code Status Orders  (From admission, onward)         Start     Ordered   05/16/20 0212  Full code  Continuous        05/16/20 0213        Code Status History    This patient has a current code status but no historical code status.   Advance Care Planning Activity     Family Communication: Left message for daughter on the phone Disposition Plan: Status is: Observation.  We will contact utilization management about possibly changing to inpatient status.  Dispo: The patient is from: Home  Anticipated d/c is to: Physical therapy recommended rehab              Anticipated d/c date is: Likely a couple days here in the hospital              Patient currently being treated for acute bilateral cerebellar strokes.  Patient's balance is off and likely will need rehab.  Consultants:  Neurology  Time spent: 32 minutes  Alizeh Mad[employer]

## 2020-05-16 NOTE — Consult Note (Signed)
[facility] Vascular Consult Note  MRN : [id number]  Peter Page is a 59 y.o. (1961-03-31) male who presents with chief complaint of  Chief Complaint  Patient presents with  . Loss of Consciousness   History of Present Illness:  Peter Page is a 59 year old male with a medical history significant for tobacco abuse until 2 years ago (he does vape) who was brought in by EMS after syncopal episode witnessed by his family.    He reports some general fatigue for the past week as well as his shortness of breath that started 4 weeks ago which prompted him quitting vaping.  He does feel that the shortness of breath improved with stopping vaping.  He denies any cardiac complaints, any new bowel or bladder issues, any numbness or focal weakness, any changes in his hearing, double vision, trouble with his speech or swallow.  He denies any infectious symptoms including fevers and denies any significant recent weight loss reporting a chronic poor appetite for many years.  He does not feel that he has had any change in his chronic cough lately.  He currently has a headache in the posterior part of his head, but denies any nausea, vomiting, light or sound sensitivity.  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  CTA neck: 1. 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 2. No significant carotid bifurcation disease. 3. No intracranial anterior circulation large or medium vessel occlusion. 4. 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 No antegrade flow through the foramen magnum. Right vertebral artery widely patent. No basilar stenosis. Superior cerebellar and posterior cerebral arteries are patent. No left PICA flow demonstrated. 5. Severe emphysema.  Vascular surgery was consulted by Dr. Renae Gloss for left  subclavian artery stenosis.  Current Facility-Administered Medications  Medication Dose Route Frequency Provider Last Rate Last Admin  . 0.9 %  sodium chloride infusion   Intravenous Continuous Andris Baumann, MD 75 mL/hr at 05/16/20 1152 New Bag at 05/16/20 1152  . acetaminophen (TYLENOL) tablet 650 mg  650 mg Oral Q4H PRN Andris Baumann, MD   650 mg at 05/16/20 1147   Or  . acetaminophen (TYLENOL) 160 MG/5ML solution 650 mg  650 mg Per Tube Q4H PRN Andris Baumann, MD       Or  . acetaminophen (TYLENOL) suppository 650 mg  650 mg Rectal Q4H PRN Andris Baumann, MD      . Melene Muller ON 05/17/2020] aspirin EC tablet 81 mg  81 mg Oral Daily Wieting, Richard, MD      . atorvastatin (LIPITOR) tablet 40 mg  40 mg Oral Daily Andris Baumann, MD   40 mg at 05/16/20 1147  . clopidogrel (PLAVIX) tablet 300 mg  300 mg Oral Once Bhagat, Srishti L, MD      . Melene Muller ON 05/17/2020] clopidogrel (PLAVIX) tablet 75 mg  75 mg Oral Daily Bhagat, Srishti L, MD      . enoxaparin (LOVENOX) injection 40 mg  40 mg Subcutaneous Q24H Lindajo Royal V, MD   40 mg at 05/16/20 1149  . ondansetron (ZOFRAN) injection 4 mg  4 mg Intravenous Q6H PRN Alford Highland, MD       Past Medical History:  Diagnosis Date  . Asthma   . COPD (chronic obstructive pulmonary disease) (HCC)    History reviewed. No  pertinent surgical history.  Social History Social History   Tobacco Use  . Smoking status: Former Smoker    Types: Cigarettes    Quit date: 2020    Years since quitting: 1.8  . Smokeless tobacco: Never Used  Vaping Use  . Vaping Use: Former  Substance Use Topics  . Alcohol use: Yes    Comment: "i did all night I wish I hadn't"  . Drug use: Yes    Types: Marijuana    Comment: daily   Family History History reviewed. No pertinent family history.  Denies family history of peripheral artery disease, venous disease or renal disease.  No Known Allergies  REVIEW OF SYSTEMS (Negative unless  checked)  Constitutional: [] Weight loss  [] Fever  [] Chills Cardiac: [] Chest pain   [] Chest pressure   [] Palpitations   [] Shortness of breath when laying flat   [] Shortness of breath at rest   [] Shortness of breath with exertion. Vascular:  [] Pain in legs with walking   [] Pain in legs at rest   [] Pain in legs when laying flat   [] Claudication   [] Pain in feet when walking  [] Pain in feet at rest  [] Pain in feet when laying flat   [] History of DVT   [] Phlebitis   [] Swelling in legs   [] Varicose veins   [] Non-healing ulcers Pulmonary:   [] Uses home oxygen   [] Productive cough   [] Hemoptysis   [] Wheeze  [] COPD   [] Asthma Neurologic:  [x] Dizziness  [] Blackouts   [] Seizures   [] History of stroke   [] History of TIA  [] Aphasia   [] Temporary blindness   [] Dysphagia   [] Weakness or numbness in arms   [] Weakness or numbness in legs Musculoskeletal:  [] Arthritis   [] Joint swelling   [] Joint pain   [] Low back pain Hematologic:  [] Easy bruising  [] Easy bleeding   [] Hypercoagulable state   [] Anemic  [] Hepatitis Gastrointestinal:  [] Blood in stool   [] Vomiting blood  [] Gastroesophageal reflux/heartburn   [] Difficulty swallowing. Genitourinary:  [] Chronic kidney disease   [] Difficult urination  [] Frequent urination  [] Burning with urination   [] Blood in urine Skin:  [] Rashes   [] Ulcers   [] Wounds Psychological:  [] History of anxiety   []  History of major depression.  Positive for syncope and nausea.  Physical Examination  Vitals:   05/16/20 0730 05/16/20 0800 05/16/20 0830 05/16/20 1004  BP: 111/89 (!) 145/97 118/71 93/71  Pulse: 79 (!) 56 72 62  Resp: 18   18  Temp:    98.5 F (36.9 C)  TempSrc:    Oral  SpO2: 94% 93% 97% 97%  Weight:      Height:       Body mass index is 21.95 kg/m. Gen:  WD/WN, NAD Head: [place]/AT, No temporalis wasting. Prominent temp pulse not noted. Ear/Nose/Throat: Hearing grossly intact, nares w/o erythema or drainage, oropharynx w/o Erythema/Exudate Eyes: Sclera non-icteric,  conjunctiva clear Neck: Trachea midline.  No JVD.  Pulmonary:  Good air movement, respirations not labored, equal bilaterally.  Cardiac: RRR, normal S1, S2. Vascular:  Vessel Right Left  Radial Palpable Palpable  Ulnar Palpable Palpable  Brachial Palpable Palpable  Carotid Palpable, without bruit Palpable, without bruit  Aorta Not palpable N/A  Femoral Palpable Palpable  Popliteal Palpable Palpable  PT Palpable Palpable  DP Palpable Palpable   Gastrointestinal: soft, non-tender/non-distended. No guarding/reflex.  Musculoskeletal: M/S 5/5 throughout.  Extremities without ischemic changes.  No deformity or atrophy. No edema. Neurologic: Sensation grossly intact in extremities.  Symmetrical.  Speech is fluent. Motor exam  as listed above. Psychiatric: Judgment intact, Mood & affect appropriate for pt's clinical situation. Dermatologic: No rashes or ulcers noted.  No cellulitis or open wounds. Lymph : No Cervical, Axillary, or Inguinal lymphadenopathy.  CBC Lab Results  Component Value Date   WBC 7.0 05/16/2020   HGB 12.6 (L) 05/16/2020   HCT 38.4 (L) 05/16/2020   MCV 93.9 05/16/2020   PLT 242 05/16/2020   BMET    Component Value Date/Time   NA 140 05/15/2020 1609   NA 144 01/19/2014 1639   K 3.7 05/15/2020 1609   K 4.1 01/19/2014 1639   CL 107 05/15/2020 1609   CL 106 01/19/2014 1639   CO2 21 (L) 05/15/2020 1609   CO2 31 01/19/2014 1639   GLUCOSE 145 (H) 05/15/2020 1609   GLUCOSE 96 01/19/2014 1639   BUN 12 05/15/2020 1609   BUN 11 01/19/2014 1639   CREATININE 0.98 05/16/2020 0235   CREATININE 0.94 01/19/2014 1639   CALCIUM 8.2 (L) 05/15/2020 1609   CALCIUM 8.7 01/19/2014 1639   GFRNONAA >60 05/16/2020 0235   GFRNONAA >60 01/19/2014 1639   GFRAA >60 09/04/2017 0851   GFRAA >60 01/19/2014 1639   Estimated Creatinine Clearance: 65.8 mL/min (by C-G formula based on SCr of 0.98 mg/dL).  COAG Lab Results  Component Value Date   INR 1.0 05/16/2020   Radiology CT  Code Stroke CTA Head W/WO contrast  Result Date: 05/16/2020 CLINICAL DATA:  Dizziness and vertigo. 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 EXAM: CT ANGIOGRAPHY HEAD AND NECK TECHNIQUE: Multidetector CT imaging of the head and neck was performed using the standard protocol during bolus administration of intravenous contrast. Multiplanar CT image reconstructions and MIPs were obtained to evaluate the vascular anatomy. Carotid stenosis measurements (when applicable) are obtained utilizing NASCET criteria, using the distal internal carotid diameter as the denominator. CONTRAST:  75mL OMNIPAQUE IOHEXOL 350 MG/ML SOLN COMPARISON:  05/15/2020 FINDINGS: CT HEAD FINDINGS Brain: Small areas of low-density visible within the cerebellum, particularly in the region of the vermis. No evidence of mass effect or hemorrhage. Cerebral hemispheres appear normal by CT. No evidence of stroke, mass, hemorrhage, hydrocephalus or extra-axial collection. Vascular: There is atherosclerotic calcification of the major vessels at the base of the brain. Skull: Negative Sinuses: Clear Orbits: Normal Review of the MIP images confirms the above findings CTA NECK FINDINGS Aortic arch: Aortic atherosclerotic calcification. Branching pattern is normal without origin stenosis. Right carotid system: Common carotid artery widely patent to the bifurcation. Soft and calcified plaque at the ICA bulb but without stenosis when compared to the more distal cervical ICA diameter. Left carotid system: Common carotid artery widely patent to the bifurcation. No soft or calcified plaque at the bifurcation or bulb. Cervical ICA widely patent. Vertebral arteries: Right subclavian artery is normal. Right vertebral origin is widely patent. 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 This appears to be due to soft plaque. Left vertebral artery origin is widely patent. Left vertebral artery is normal through the  cervical region to the foramen magnum. Skeleton: Degenerative cervical spondylosis and facet osteoarthritis. Other neck: No soft tissue mass or lymphadenopathy. Upper chest: Severe emphysema in the upper lungs. Review of the MIP images confirms the above findings CTA HEAD FINDINGS Anterior circulation: Both internal carotid arteries patent through the skull base and siphon regions. Ordinary siphon atherosclerotic calcification but no stenosis greater than 30%. The anterior and middle cerebral vessels are patent without proximal stenosis, aneurysm or vascular  malformation. No large or medium vessel occlusion. Posterior circulation: Right vertebral artery is a large vessel widely patent through the foramen magnum. There is calcified plaque affecting the right vertebral V4 segment but no stenosis. I think there is a very small right PICA. 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 No antegrade flow through the foramen magnum. No left PICA demonstrated. No basilar stenosis. Small bilateral anterior inferior cerebellar arteries show flow. Superior cerebellar and posterior cerebral arteries are patent. Venous sinuses: Patent and normal. Anatomic variants: None significant. Review of the MIP images confirms the above findings IMPRESSION: 1. 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 2. No significant carotid bifurcation disease. 3. No intracranial anterior circulation large or medium vessel occlusion. 4. 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 No antegrade flow through the foramen magnum. Right vertebral artery widely patent. No basilar stenosis. Superior cerebellar and posterior cerebral arteries are patent. No left PICA flow demonstrated. 5. Severe emphysema. Aortic Atherosclerosis (ICD10-I70.0)  and Emphysema (ICD10-J43.9). Electronically Signed   By: Paulina FusiMark  Shogry M.D.   On: 05/16/2020 11:03   DG Orbits  Result Date: 05/15/2020 CLINICAL DATA:  Preprocedure clearance for MRI, syncope, dizziness EXAM: ORBITS - COMPLETE 4+ VIEW COMPARISON:  None. FINDINGS: There is no evidence of metallic foreign body within the orbits. No significant bone abnormality identified. IMPRESSION: No evidence of metallic foreign body within the orbits. Electronically Signed   By: Sharlet SalinaMichael  Bussiere M.D.   On: 05/15/2020 22:35   CT Code Stroke CTA Neck W/WO contrast  Result Date: 05/16/2020 CLINICAL DATA:  Dizziness and vertigo. 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 EXAM: CT ANGIOGRAPHY HEAD AND NECK TECHNIQUE: Multidetector CT imaging of the head and neck was performed using the standard protocol during bolus administration of intravenous contrast. Multiplanar CT image reconstructions and MIPs were obtained to evaluate the vascular anatomy. Carotid stenosis measurements (when applicable) are obtained utilizing NASCET criteria, using the distal internal carotid diameter as the denominator. CONTRAST:  75mL OMNIPAQUE IOHEXOL 350 MG/ML SOLN COMPARISON:  05/15/2020 FINDINGS: CT HEAD FINDINGS Brain: Small areas of low-density visible within the cerebellum, particularly in the region of the vermis. No evidence of mass effect or hemorrhage. Cerebral hemispheres appear normal by CT. No evidence of stroke, mass, hemorrhage, hydrocephalus or extra-axial collection. Vascular: There is atherosclerotic calcification of the major vessels at the base of the brain. Skull: Negative Sinuses: Clear Orbits: Normal Review of the MIP images confirms the above findings CTA NECK FINDINGS Aortic arch: Aortic atherosclerotic calcification. Branching pattern is normal without origin stenosis. Right carotid system: Common carotid artery widely patent to the bifurcation. Soft and calcified plaque at the ICA bulb but without stenosis when  compared to the more distal cervical ICA diameter. Left carotid system: Common carotid artery widely patent to the bifurcation. No soft or calcified plaque at the bifurcation or bulb. Cervical ICA widely patent. Vertebral arteries: Right subclavian artery is normal. Right vertebral origin is widely patent. 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 This appears to be due to soft plaque. Left vertebral artery origin is widely patent. Left vertebral artery is normal through the cervical region to the foramen magnum. Skeleton: Degenerative cervical spondylosis and facet osteoarthritis. Other neck: No soft tissue mass or lymphadenopathy. Upper chest: Severe emphysema in the upper  lungs. Review of the MIP images confirms the above findings CTA HEAD FINDINGS Anterior circulation: Both internal carotid arteries patent through the skull base and siphon regions. Ordinary siphon atherosclerotic calcification but no stenosis greater than 30%. The anterior and middle cerebral vessels are patent without proximal stenosis, aneurysm or vascular malformation. No large or medium vessel occlusion. Posterior circulation: Right vertebral artery is a large vessel widely patent through the foramen magnum. There is calcified plaque affecting the right vertebral V4 segment but no stenosis. I think there is a very small right PICA. 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 No antegrade flow through the foramen magnum. No left PICA demonstrated. No basilar stenosis. Small bilateral anterior inferior cerebellar arteries show flow. Superior cerebellar and posterior cerebral arteries are patent. Venous sinuses: Patent and normal. Anatomic variants: None significant. Review of the MIP images confirms the above findings IMPRESSION: 1. 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 2. No significant carotid bifurcation disease. 3. No intracranial anterior circulation large or medium vessel occlusion. 4. 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 No antegrade flow through the foramen magnum. Right vertebral artery widely patent. No basilar stenosis. Superior cerebellar and posterior cerebral arteries are patent. No left PICA flow demonstrated. 5. Severe emphysema. Aortic Atherosclerosis (ICD10-I70.0) and Emphysema (ICD10-J43.9). Electronically Signed   By: Paulina Fusi M.D.   On: 05/16/2020 11:03   MR ANGIO HEAD WO CONTRAST  Result Date: 05/16/2020 CLINICAL DATA:  Dizziness, vertigo EXAM: MRI HEAD WITHOUT CONTRAST MRA HEAD WITHOUT CONTRAST TECHNIQUE: Multiplanar, multiecho pulse sequences of the brain and surrounding structures were obtained without intravenous contrast. Angiographic images of the head were obtained using MRA technique without contrast. COMPARISON:  None. FINDINGS: MRI HEAD FINDINGS Brain: Multifocal acute ischemia within both cerebellar hemispheres. Multifocal hyperintense T2-weighted signal within the white matter. Normal volume of CSF spaces. No chronic microhemorrhage. Normal midline structures. Vascular: Normal flow voids. Skull and upper cervical spine: Normal marrow signal. Sinuses/Orbits: Negative. Other: None. MRA HEAD FINDINGS POSTERIOR CIRCULATION: --Vertebral arteries: Left vertebral artery terminates in PICA. --Inferior cerebellar arteries: Intermittent loss of enhancement in the left PICA. No right PICA enhancement visualized. --Basilar artery: Normal. --Superior cerebellar arteries: Normal. --Posterior cerebral arteries: Normal. ANTERIOR CIRCULATION: --Intracranial internal carotid arteries: Normal. --Anterior cerebral arteries (ACA): Normal. --Middle cerebral arteries (MCA): Normal. ANATOMIC VARIANTS: Fetal origin of the right PCA. IMPRESSION: 1. Multifocal  acute ischemia within both cerebellar hemispheres. No hemorrhage or mass effect. 2. 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 Electronically Signed   By: Deatra Robinson M.D.   On: 05/16/2020 00:07   MR BRAIN WO CONTRAST  Result Date: 05/16/2020 CLINICAL DATA:  Dizziness, vertigo EXAM: MRI HEAD WITHOUT CONTRAST MRA HEAD WITHOUT CONTRAST TECHNIQUE: Multiplanar, multiecho pulse sequences of the brain and surrounding structures were obtained without intravenous contrast. Angiographic images of the head were obtained using MRA technique without contrast. COMPARISON:  None. FINDINGS: MRI HEAD FINDINGS Brain: Multifocal acute ischemia within both cerebellar hemispheres. Multifocal hyperintense T2-weighted signal within the white matter. Normal volume of CSF spaces. No chronic microhemorrhage. Normal midline structures. Vascular: Normal flow voids. Skull and upper cervical spine: Normal marrow signal. Sinuses/Orbits: Negative. Other: None. MRA HEAD FINDINGS POSTERIOR CIRCULATION: --Vertebral arteries: Left vertebral artery terminates in PICA. --Inferior cerebellar arteries: Intermittent loss of enhancement in the left PICA.  No right PICA enhancement visualized. --Basilar artery: Normal. --Superior cerebellar arteries: Normal. --Posterior cerebral arteries: Normal. ANTERIOR CIRCULATION: --Intracranial internal carotid arteries: Normal. --Anterior cerebral arteries (ACA): Normal. --Middle cerebral arteries (MCA): Normal. ANATOMIC VARIANTS: Fetal origin of the right PCA. IMPRESSION: 1. Multifocal acute ischemia within both cerebellar hemispheres. No hemorrhage or mass effect. 2. 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 Electronically Signed   By: Deatra Robinson M.D.   On: 05/16/2020 00:07   US Carotid Bilateral (at [facility] and [facility] only)  Result Date: 05/16/2020 CLINICAL DATA:  Stroke. EXAM: BILATERAL CAROTID DUPLEX ULTRASOUND TECHNIQUE: Wallace Cullens scale imaging, color Doppler and duplex  ultrasound were performed of bilateral carotid and vertebral arteries in the neck. COMPARISON:  CTA neck 05/16/2020 FINDINGS: Criteria: Quantification of carotid stenosis is based on velocity parameters that correlate the residual internal carotid diameter with NASCET-based stenosis levels, using the diameter of the distal internal carotid lumen as the denominator for stenosis measurement. The following velocity measurements were obtained: RIGHT ICA: 87/29 cm/sec CCA: 69/18 cm/sec SYSTOLIC ICA/CCA RATIO:  1.3 ECA: 89 cm/sec LEFT ICA: 111/38 cm/sec CCA: 84/21 cm/sec SYSTOLIC ICA/CCA RATIO:  1.3 ECA: 106 cm/sec RIGHT CAROTID ARTERY: Small amount of plaque in the distal common carotid artery and carotid bulb. External carotid artery is patent with normal waveform. Small amount of plaque in the proximal internal carotid artery. Normal waveforms and velocities in the internal carotid artery. RIGHT VERTEBRAL ARTERY: Antegrade flow and normal waveform in the right vertebral artery. LEFT CAROTID ARTERY: Intimal thickening at the left carotid bulb. External carotid artery is patent with normal waveform. Normal waveforms and velocities in the internal carotid artery. LEFT VERTEBRAL ARTERY: Antegrade flow and normal waveform in the left vertebral artery. IMPRESSION: 1. Small amount of atherosclerotic plaque involving the carotid arteries, most prominent at the right carotid bulb region. Estimated degree of stenosis in the internal carotid arteries is less than 50% bilaterally. 2. Patent vertebral arteries with antegrade flow. Electronically Signed   By: Richarda Overlie M.D.   On: 05/16/2020 12:04   DG Chest Portable 1 View  Result Date: 05/15/2020 CLINICAL DATA:  Syncope, dizziness EXAM: PORTABLE CHEST 1 VIEW COMPARISON:  None. FINDINGS: 2 frontal views of the chest demonstrate an unremarkable cardiac silhouette. Diffuse interstitial prominence and scarring throughout the lungs compatible with emphysema. No airspace disease,  effusion, or pneumothorax. No acute bony abnormalities. IMPRESSION: 1. Emphysema.  No acute airspace disease. Electronically Signed   By: Sharlet Salina M.D.   On: 05/15/2020 17:42   Assessment/Plan The patient is a 59 year old male with a past medical history of history of tobacco use, current vaping use, COPD, asthma, with recent diagnosis of acute CVA in setting of left subclavian stenosis  1. Left Subclavian Artery Stenosis: Physical exam is relatively unremarkable.  Patient denies any left upper extremity symptoms.  Recent CVA.  No residual neurological deficits.  Agree with the initiation of aspirin, Plavix and Lipitor for management.  Reviewed images with Dr. Wyn Quaker.  Almost near occlusion of left subclavian artery.  Would recommend repair however would would wait at least 2 weeks in setting of acute CVA. Procedure, risks and benefits were explained to the patient.  All questions were answered.  Patient like to proceed.  We will reach out to our office scheduler and have patient added to Dr. Driscilla Grammes schedule.  Patient is aware and in agreement with the plan.  2. Past Tobacco Abuse / Current Vaping Use: We had a discussion for approximately three minutes  regarding the absolute need for smoking / vaping cessation due to the deleterious nature of tobacco / nicotine on the vascular system. We discussed continued tobacco / nicotine use would diminish patency of any intervention, and likely significantly worsen progression of disease.   3.  Acute cerebellar stroke: Neurology is following Plavix, aspirin and statin for medical management  Discussed with Dr. Weldon Inches, PA-C  05/16/2020 12:39 PM  This note was created with Dragon medical transcription system.  Any error is purely unintentional

## 2020-05-16 NOTE — Evaluation (Signed)
Occupational Therapy Evaluation Patient Details Name: Peter Page MRN: [id number] DOB: 07/11/1960 Today's Date: 05/16/2020    History of Present Illness Ketan Renz is a 59yoM who comes to [facility] ED after fall, found on porch by family, severe dizziness. 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   Clinical Impression   Mr Piacentini was seen for OT evaluation this date. Prior to hospital admission, pt was Independent for mobility and I/ADLs. Pt lives c 8 family members in mobile home (including sister and her 4 children ages 11-14). Pt presents to acute OT demonstrating impaired ADL performance and functional mobility 2/2 decreased activity tolerance, functional balance deficits, poor insight into deficits, and decreased safety awareness. Upon arrival pt bed alarm going off, found up in bathroom - requires MOD A for balance for toilet t/f - multiple LOBs and ataxic gait. MOD A for standing balance to don/doff jeans and hand wash sink side. SBA toileting at commode including perihygiene. Pt left in bed c alarm set to moving to EOB - RN aware. Pt would benefit from skilled OT to address noted impairments and functional limitations (see below for any additional details) in order to maximize safety and independence while minimizing falls risk and caregiver burden. Upon hospital discharge, recommend STR to maximize pt safety and return to PLOF.     Follow Up Recommendations  SNF (supervision for mobility)    Equipment Recommendations  Other (comment) (TBD)    Recommendations for Other Services       Precautions / Restrictions Precautions Precautions: Fall Restrictions Weight Bearing Restrictions: No      Mobility Bed Mobility Overal bed mobility: Needs Assistance Bed Mobility: Sit to Supine    Sit to supine: Supervision   General bed mobility comments: poor safety awareness, poor awareness of lines/leads; impulsive; otherwise moves well without obvious weakness     Transfers Overall transfer level: Needs assistance Equipment used: None Transfers: Sit to/from Stand Sit to Stand: Min assist    General transfer comment: assist for balalnce 2/2 ataxia and LOBs     Balance Overall balance assessment: Needs assistance Sitting-balance support: Feet unsupported;Single extremity supported Sitting balance-Leahy Scale: Fair     Standing balance support: No upper extremity supported;During functional activity Standing balance-Leahy Scale: Zero              ADL either performed or assessed with clinical judgement   ADL Overall ADL's : Needs assistance/impaired        General ADL Comments: MOD A for balalnce LBD and hand washing in standing. SBA toileting at commode including perihygiene. MIN A for toilet t/f - assist for balalnce, many LOBs noted and ataxic gait.                   Pertinent Vitals/Pain Pain Assessment: No/denies pain     Hand Dominance Right   Extremity/Trunk Assessment Upper Extremity Assessment Upper Extremity Assessment: Generalized weakness (ataxic)   Lower Extremity Assessment Lower Extremity Assessment: Generalized weakness       Communication Communication Communication: No difficulties   Cognition Arousal/Alertness: Awake/alert Behavior During Therapy: Agitated;Impulsive Overall Cognitive Status: No family/caregiver present to determine baseline cognitive functioning           General Comments       Exercises Exercises: Other exercises Other Exercises Other Exercises: Pt educated re: OT role, DME recs, d/c recs, falls prevention, ECS, safety c mobility/ADLs Other Exercises: Toileting, hand washing, LBD, sit>sup, sit,>stand, sitting/standing balance/tolerance, ~10 ft in room mobility  Shoulder Instructions      Home Living Family/patient expects to be discharged to:: Private residence Living Arrangements: Other relatives (2 sisters and their 4 children (ages 27-14)) Available Help at  Discharge: Family Type of Home: Mobile home Home Access: Stairs to enter Secretary/administrator of Steps: 6 Entrance Stairs-Rails: Left;Right Home Layout: One level        Prior Functioning/Environment Level of Independence: Independent        Comments: on disability        OT Problem List: Decreased activity tolerance;Impaired balance (sitting and/or standing);Impaired vision/perception;Decreased coordination;Impaired UE functional use      OT Treatment/Interventions: Self-care/ADL training;Therapeutic exercise;Energy conservation;DME and/or AE instruction;Therapeutic activities;Balance training;Patient/family education;Cognitive remediation/compensation    OT Goals(Current goals can be found in the care plan section) Acute Rehab OT Goals Patient Stated Goal: to return home OT Goal Formulation: With patient Time For Goal Achievement: 05/30/20 Potential to Achieve Goals: Fair ADL Goals Pt Will Perform Grooming: with modified independence;standing (c LRAD PRN) Pt Will Perform Lower Body Dressing: sit to/from stand;Independently Pt Will Transfer to Toilet: Independently;ambulating;regular height toilet  OT Frequency: Min 1X/week   Barriers to D/C: Inaccessible home environment;Decreased caregiver support             AM-PAC OT "6 Clicks" Daily Activity     Outcome Measure Help from another person eating meals?: None Help from another person taking care of personal grooming?: A Lot Help from another person toileting, which includes using toliet, bedpan, or urinal?: A Little Help from another person bathing (including washing, rinsing, drying)?: A Lot Help from another person to put on and taking off regular upper body clothing?: A Little Help from another person to put on and taking off regular lower body clothing?: A Little 6 Click Score: 17   End of Session Nurse Communication: Mobility status;Other (comment) (bed alarm "moving EOB" )  Activity Tolerance: Patient  tolerated treatment well Patient left: in bed;with call bell/phone within reach;with bed alarm set  OT Visit Diagnosis: Other abnormalities of gait and mobility (R26.89);Hemiplegia and hemiparesis Hemiplegia - caused by: Cerebral infarction                Time: 1445-1455 OT Time Calculation (min): 10 min Charges:  OT General Charges $OT Visit: 1 Visit OT Evaluation $OT Eval Low Complexity: 1 Low  Kathie Dike, M.S. OTR/L  05/16/20, 3:22 PM  ascom [phone]

## 2020-05-16 NOTE — ED Notes (Signed)
Pt noted to have O2 saturation of 88% on room air, placed on 2L nasal cannula with improvement to 94%. MD Katrinka Blazing made aware.

## 2020-05-16 NOTE — ED Notes (Signed)
Resumed care from [place], [place]. Pt resting; awakened to apply pulse oximeter. No needs expressed at this time; will continue to monitor.

## 2020-05-16 NOTE — Progress Notes (Signed)
Chart reviewed. Attempted to visit but Pt is out of room for testing. No ST needs identified in chart.

## 2020-05-16 NOTE — TOC Initial Note (Signed)
Transition of Care [facility]) - Initial/Assessment Note    Patient Details  Name: Peter Page MRN: [id number] Date of Birth: 1960/10/03  [facility] CM/SW Contact:    Liliana Cline, LCSW Phone Number: 05/16/2020, 1:20 PM  Clinical Narrative:                CSW spoke with patient. Patient reported he lives at home with "family" and drives himself to appointments. Confirmed home address in chart is correct. No PCP, patient said he does not need one. Patient reported he does not have any medications, but would [facility] [place] if he needed medicines. No HH or SNF history. Patient has a RW at home. CSW informed patient of SNF recommendation from PT. Patient refuses SNF. Discussed Home Health. Patient refuses HH, reported he will let CSW know if he changes his mind. He denied needs prior to discharge.   Expected Discharge Plan: Home/Self Care Barriers to Discharge: Continued Medical Work up   Patient Goals and CMS Choice Patient states their goals for this hospitalization and ongoing recovery are:: to return home- refuses SNF and [facility] CMS Medicare.gov Compare Post Acute Care list provided to:: Patient Choice offered to / list presented to : Patient  Expected Discharge Plan and Services Expected Discharge Plan: Home/Self Care       Living arrangements for the past 2 months: Single Family Home                                      Prior Living Arrangements/Services Living arrangements for the past 2 months: Single Family Home Lives with:: Relatives Patient language and need for interpreter reviewed:: Yes Do you feel safe going back to the place where you live?: Yes      Need for Family Participation in Patient Care: Yes (Comment) Care giver support system in place?: Yes (comment) Current home services: DME Criminal Activity/Legal Involvement Pertinent to Current Situation/Hospitalization: No - Comment as needed  Activities of Daily Living Home Assistive  Devices/Equipment: Dentures (specify type), Cane (specify quad or straight) ADL Screening (condition at time of admission) Patient's cognitive ability adequate to safely complete daily activities?: No Is the patient deaf or have difficulty hearing?: No Does the patient have difficulty seeing, even when wearing glasses/contacts?: No Does the patient have difficulty concentrating, remembering, or making decisions?: No Patient able to express need for assistance with ADLs?: Yes Does the patient have difficulty dressing or bathing?: No Independently performs ADLs?: Yes (appropriate for developmental age) Does the patient have difficulty walking or climbing stairs?: No Weakness of Legs: Both Weakness of Arms/Hands: Both  Permission Sought/Granted                  Emotional Assessment       Orientation: : Oriented to Situation, Oriented to  Time, Oriented to Place, Oriented to Self Alcohol / Substance Use: Not Applicable Psych Involvement: No (comment)  Admission diagnosis:  Vertigo [R42] Stroke (cerebrum) (HCC) [I63.9] Abnormal MRI of head [R93.0] Acute CVA (cerebrovascular accident) (HCC) [I63.9] Cerebrovascular accident (CVA), unspecified mechanism (HCC) [I63.9] Cerebellar stroke, acute (HCC) [I63.9] Patient Active Problem List   Diagnosis Date Noted  . Cerebellar stroke, acute (HCC) 05/16/2020  . History of tobacco use 05/16/2020  . Acute CVA (cerebrovascular accident) (HCC) 05/16/2020  . Hypotension   . Dizziness   . Chronic obstructive pulmonary disease (HCC)   . Subclavian artery  stenosis, left (HCC)    PCP:  Patient, No Pcp Per Pharmacy:   [id number] [place], [place] - [address] [place] [address] Phone: [phone] Fax: [phone]     Social Determinants of Health (SDOH) Interventions    Readmission Risk Interventions No flowsheet data found.

## 2020-05-16 NOTE — Progress Notes (Signed)
CH visited pt. per OR for prayer; pt. lying on his side in bed when [facility] arrived; pt. shared he suffered a stroke approx. 2 days ago and was brought to [facility] via EMS; pt. did not request prayer at this time but did share 'me and Jesus are gonna ride this one out together.'  CH informed pt. of chaplains' availability if needed.

## 2020-05-16 NOTE — H&P (Signed)
History and Physical    Gussie Towson LEX:[id number] DOB: 1961-03-16 DOA: 05/15/2020  PCP: Patient, No Pcp Per   Patient coming from: Home  I have personally briefly reviewed patient's old medical records in [facility]  Chief Complaint: Passed out zziness and nausea  HPI: Keenen Roessner is a 59 y.o. male with medical history significant for tobacco abuse until 2 years ago who was brought in by EMS after syncopal episode witnessed by his family.  Following the episode he complained of dizziness on standing and nausea.  Patient received Zofran with EMS but after several hours in the emergency room continues to have a sensation of spinning of the room associated with nausea.  ED Course: On arrival, vitals were within normal limits.  His blood work was unremarkable.  EtOH level less than 10, troponin normal at 5 then 6, lipase 21.  CMP and CBC mostly within normal limits.  Covid and flu test negative. EKG as reviewed by me : Normal sinus rhythm with no acute ST-T wave changes Chest x-ray clear MRI brain  Multifocal acute ischemia within both cerebellar hemispheres. No hemorrhage or mass effect. 2. 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 MRI angio .Multifocal acute ischemia within both cerebellar hemispheres. No hemorrhage or mass effect. 2. 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  Patient arrived outside of TPA window.  Was treated with aspirin in the emergency room.  Review of Systems: As per HPI otherwise all other systems on review of systems negative.    Past Medical History:  Diagnosis Date  . Asthma   . COPD (chronic obstructive pulmonary disease) (HCC)     History reviewed. No pertinent surgical history.   reports that he quit smoking about 22 months ago. His smoking use included cigarettes. He has never used smokeless tobacco. He reports current alcohol use. He reports current drug use. Drug: Marijuana.  No Known  Allergies  History reviewed. No pertinent family history.    Prior to Admission medications   Medication Sig Start Date End Date Taking? Authorizing Provider  famotidine (PEPCID) 20 MG tablet Take 1 tablet (20 mg total) by mouth 2 (two) times daily. 09/04/17   Sharman Cheek, MD  loperamide (IMODIUM A-D) 2 MG tablet Take 2 tablets (4 mg total) by mouth 4 (four) times daily as needed for diarrhea or loose stools. 09/04/17   Sharman Cheek, MD  ondansetron (ZOFRAN ODT) 4 MG disintegrating tablet Take 1 tablet (4 mg total) by mouth every 8 (eight) hours as needed for nausea or vomiting. 09/04/17   Sharman Cheek, MD    Physical Exam: Vitals:   05/16/20 0115 05/16/20 0130 05/16/20 0145 05/16/20 0200  BP:      Pulse:  71 78 78  Resp: (!) 21 15 16 16   Temp:      TempSrc:      SpO2:  97% 97% 97%  Weight:      Height:         Vitals:   05/16/20 0115 05/16/20 0130 05/16/20 0145 05/16/20 0200  BP:      Pulse:  71 78 78  Resp: (!) 21 15 16 16   Temp:      TempSrc:      SpO2:  97% 97% 97%  Weight:      Height:          Constitutional: Alert and oriented x 3 . Not in any apparent distress HEENT:      Head:  Normocephalic and atraumatic.         Eyes: PERLA, EOMI, Conjunctivae are normal. Sclera is non-icteric.       Mouth/Throat: Mucous membranes are moist.       Neck:  [place] Kimberly were negative.  ER alcohol with no signs of meningismus. Cardiovascular: Regular rate and rhythm. No murmurs, gallops, or rubs. 2+ symmetrical distal pulses are present . No JVD. No LE edema Respiratory: Respiratory effort normal .Lungs sounds clear bilaterally. No wheezes, crackles, or rhonchi.  Gastrointestinal: Soft, non tender, and non distended with positive bowel sounds. No rebound or guarding. Genitourinary: No CVA tenderness. Musculoskeletal: Nontender with normal range of motion in all extremities. No cyanosis, or erythema of extremities. Neurologic:  Face is symmetric. Moving all  extremities. No gross focal neurologic deficits.  Limited exam.  Rapid alternating movements of hand appear intact.. Skin: Skin is warm, dry.  No rash or ulcers Psychiatric: Mood and affect are normal    Labs on Admission: I have personally reviewed following labs and imaging studies  CBC: Recent Labs  Lab 05/15/20 1609  WBC 8.6  NEUTROABS 6.2  HGB 14.0  HCT 41.3  MCV 92.4  PLT 304   Basic Metabolic Panel: Recent Labs  Lab 05/15/20 1609  NA 140  K 3.7  CL 107  CO2 21*  GLUCOSE 145*  BUN 12  CREATININE 0.91  CALCIUM 8.2*   GFR: Estimated Creatinine Clearance: 70.8 mL/min (by C-G formula based on SCr of 0.91 mg/dL). Liver Function Tests: Recent Labs  Lab 05/15/20 1609  AST 31  ALT 20  ALKPHOS 72  BILITOT 0.8  PROT 6.6  ALBUMIN 3.6   Recent Labs  Lab 05/15/20 1609  LIPASE 21   No results for input(s): AMMONIA in the last 168 hours. Coagulation Profile: Recent Labs  Lab 05/16/20 0058  INR 1.0   Cardiac Enzymes: No results for input(s): CKTOTAL, CKMB, CKMBINDEX, TROPONINI in the last 168 hours. BNP (last 3 results) No results for input(s): PROBNP in the last 8760 hours. HbA1C: No results for input(s): HGBA1C in the last 72 hours. CBG: No results for input(s): GLUCAP in the last 168 hours. Lipid Profile: No results for input(s): CHOL, HDL, LDLCALC, TRIG, CHOLHDL, LDLDIRECT in the last 72 hours. Thyroid Function Tests: No results for input(s): TSH, T4TOTAL, FREET4, T3FREE, THYROIDAB in the last 72 hours. Anemia Panel: No results for input(s): VITAMINB12, FOLATE, FERRITIN, TIBC, IRON, RETICCTPCT in the last 72 hours. Urine analysis:    Component Value Date/Time   COLORURINE YELLOW (A) 09/04/2017 1216   APPEARANCEUR HAZY (A) 09/04/2017 1216   LABSPEC 1.026 09/04/2017 1216   PHURINE 7.0 09/04/2017 1216   GLUCOSEU NEGATIVE 09/04/2017 1216   HGBUR NEGATIVE 09/04/2017 1216   BILIRUBINUR NEGATIVE 09/04/2017 1216   KETONESUR NEGATIVE 09/04/2017 1216    PROTEINUR 30 (A) 09/04/2017 1216   NITRITE NEGATIVE 09/04/2017 1216   LEUKOCYTESUR NEGATIVE 09/04/2017 1216    Radiological Exams on Admission: DG Orbits  Result Date: 05/15/2020 CLINICAL DATA:  Preprocedure clearance for MRI, syncope, dizziness EXAM: ORBITS - COMPLETE 4+ VIEW COMPARISON:  None. FINDINGS: There is no evidence of metallic foreign body within the orbits. No significant bone abnormality identified. IMPRESSION: No evidence of metallic foreign body within the orbits. Electronically Signed   By: Sharlet Salina M.D.   On: 05/15/2020 22:35   MR ANGIO HEAD WO CONTRAST  Result Date: 05/16/2020 CLINICAL DATA:  Dizziness, vertigo EXAM: MRI HEAD WITHOUT CONTRAST MRA HEAD WITHOUT CONTRAST TECHNIQUE: Multiplanar, multiecho pulse  sequences of the brain and surrounding structures were obtained without intravenous contrast. Angiographic images of the head were obtained using MRA technique without contrast. COMPARISON:  None. FINDINGS: MRI HEAD FINDINGS Brain: Multifocal acute ischemia within both cerebellar hemispheres. Multifocal hyperintense T2-weighted signal within the white matter. Normal volume of CSF spaces. No chronic microhemorrhage. Normal midline structures. Vascular: Normal flow voids. Skull and upper cervical spine: Normal marrow signal. Sinuses/Orbits: Negative. Other: None. MRA HEAD FINDINGS POSTERIOR CIRCULATION: --Vertebral arteries: Left vertebral artery terminates in PICA. --Inferior cerebellar arteries: Intermittent loss of enhancement in the left PICA. No right PICA enhancement visualized. --Basilar artery: Normal. --Superior cerebellar arteries: Normal. --Posterior cerebral arteries: Normal. ANTERIOR CIRCULATION: --Intracranial internal carotid arteries: Normal. --Anterior cerebral arteries (ACA): Normal. --Middle cerebral arteries (MCA): Normal. ANATOMIC VARIANTS: Fetal origin of the right PCA. IMPRESSION: 1. Multifocal acute ischemia within both cerebellar hemispheres. No  hemorrhage or mass effect. 2. 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 Electronically Signed   By: Deatra Robinson M.D.   On: 05/16/2020 00:07   MR BRAIN WO CONTRAST  Result Date: 05/16/2020 CLINICAL DATA:  Dizziness, vertigo EXAM: MRI HEAD WITHOUT CONTRAST MRA HEAD WITHOUT CONTRAST TECHNIQUE: Multiplanar, multiecho pulse sequences of the brain and surrounding structures were obtained without intravenous contrast. Angiographic images of the head were obtained using MRA technique without contrast. COMPARISON:  None. FINDINGS: MRI HEAD FINDINGS Brain: Multifocal acute ischemia within both cerebellar hemispheres. Multifocal hyperintense T2-weighted signal within the white matter. Normal volume of CSF spaces. No chronic microhemorrhage. Normal midline structures. Vascular: Normal flow voids. Skull and upper cervical spine: Normal marrow signal. Sinuses/Orbits: Negative. Other: None. MRA HEAD FINDINGS POSTERIOR CIRCULATION: --Vertebral arteries: Left vertebral artery terminates in PICA. --Inferior cerebellar arteries: Intermittent loss of enhancement in the left PICA. No right PICA enhancement visualized. --Basilar artery: Normal. --Superior cerebellar arteries: Normal. --Posterior cerebral arteries: Normal. ANTERIOR CIRCULATION: --Intracranial internal carotid arteries: Normal. --Anterior cerebral arteries (ACA): Normal. --Middle cerebral arteries (MCA): Normal. ANATOMIC VARIANTS: Fetal origin of the right PCA. IMPRESSION: 1. Multifocal acute ischemia within both cerebellar hemispheres. No hemorrhage or mass effect. 2. 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 Electronically Signed   By: Deatra Robinson M.D.   On: 05/16/2020 00:07   DG Chest Portable 1 View  Result Date: 05/15/2020 CLINICAL DATA:  Syncope, dizziness EXAM: PORTABLE CHEST 1 VIEW COMPARISON:  None. FINDINGS: 2 frontal views of the chest demonstrate an unremarkable cardiac silhouette. Diffuse interstitial prominence  and scarring throughout the lungs compatible with emphysema. No airspace disease, effusion, or pneumothorax. No acute bony abnormalities. IMPRESSION: 1. Emphysema.  No acute airspace disease. Electronically Signed   By: Sharlet Salina M.D.   On: 05/15/2020 17:42     Assessment/Plan 59 year old male presenting with syncopal episode followed by persistent dizziness    Cerebellar stroke, acute (HCC) -Patient received stroke diagnosis several hours outside TPA window -MRI brain Multifocal acute ischemia within both cerebellar hemispheres. Nohemorrhage or mass effect. -MRI angio .Multifocal acute ischemia within both cerebellar hemispheres. No hemorrhage or mass effect. 2. 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 -Patient received aspirin in the emergency room -Lipid profile and A1c -Atorvastatin -Permissive hypertension -Stroke work-up to include continuous cardiac monitoring, echocardiogram -Neurology consult -Speech PT consults      DVT prophylaxis: Lovenox  Code Status: full code  Family Communication:  none  Disposition Plan: Back to previous home environment Consults called: Neurology Status:obs     Andris Baumann MD [facility]  05/16/2020, 2:17 AM

## 2020-05-16 NOTE — Evaluation (Signed)
Physical Therapy Evaluation Patient Details Name: Peter Page MRN: [id number] DOB: 18-Dec-1960 Today's Date: 05/16/2020   History of Present Illness  Peter Page is a 59yoM who comes to [facility] ED after fall, found on porch by family, severe dizziness. 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  Clinical Impression  Pt admitted with above diagnosis. Pt currently with functional limitations due to the deficits listed below (see "PT Problem List"). Upon entry, pt in asleep, tangled in lines, O2 donned but not in nares. Pt easily awakened and agreeable to participate, but remains somewhat incoherent at times, minimally interactive, impulsive, and poorly aware of his safety issues and multiple near-falls in session. The pt is alert and oriented to self, provides some basic history, but does not elaborate. Supervision required for bed mobility, minA for transfers due to ataxia and LOB, minA intermittently for AMB in room due to frequent LOB and gait ataxia. Pt reports worsening dizziness and returns to bed. Eye tracking exam WNL. Pt has dysmetria with finger to distal target. Functional mobility assessment demonstrates increased effort/time requirements, poor tolerance, and need for physical assistance, whereas the patient performed these at a higher level of independence PTA. Pt will benefit from skilled PT intervention to increase independence and safety with basic mobility in preparation for discharge to the venue listed below.       Follow Up Recommendations SNF;Supervision for mobility/OOB    Equipment Recommendations  Other (comment)    Recommendations for Other Services       Precautions / Restrictions Precautions Precautions: Fall Restrictions Weight Bearing Restrictions: No      Mobility  Bed Mobility Overal bed mobility: Needs Assistance Bed Mobility: Supine to Sit;Sit to Supine     Supine to sit: Supervision Sit to supine: Supervision   General bed mobility  comments: poor safety awareness, poor awareness of lines/leads; impulsive; otherwise moves well without obvious weakness    Transfers Overall transfer level: Needs assistance Equipment used: None Transfers: Sit to/from Stand Sit to Stand: Min guard;Min assist         General transfer comment: Able to STS without weakness, has severe gait ataxia and LOB.  Ambulation/Gait Ambulation/Gait assistance: Min assist Gait Distance (Feet): 50 Feet Assistive device: None Gait Pattern/deviations: Ataxic     General Gait Details: poor speed control, im,pulsive, poor righting responses to several LOB over 15ft, requires minA for prevent LOB anterior.  Stairs            Wheelchair Mobility    Modified Rankin (Stroke Patients Only)       Balance Overall balance assessment: Needs assistance Sitting-balance support: Feet unsupported;Single extremity supported Sitting balance-Leahy Scale: Fair     Standing balance support: No upper extremity supported;During functional activity Standing balance-Leahy Scale: Zero                               Pertinent Vitals/Pain Pain Assessment: No/denies pain    Home Living Family/patient expects to be discharged to:: Private residence Living Arrangements:  (extended family at home (8 people)) Available Help at Discharge: Family Type of Home: Mobile home Home Access: Stairs to enter Entrance Stairs-Rails: Lawyer of Steps: 6 Home Layout: One level        Prior Function Level of Independence: Independent         Comments: on disability     Hand Dominance        Extremity/Trunk Assessment  Upper Extremity Assessment Upper Extremity Assessment: Generalized weakness;Overall WFL for tasks assessed (dysmetria bilat)    Lower Extremity Assessment Lower Extremity Assessment: Generalized weakness       Communication      Cognition Arousal/Alertness: Lethargic Behavior During  Therapy: Anxious;Agitated;Impulsive Overall Cognitive Status: No family/caregiver present to determine baseline cognitive functioning                                        General Comments      Exercises     Assessment/Plan    PT Assessment Patient needs continued PT services  PT Problem List Decreased activity tolerance;Decreased balance;Decreased mobility;Decreased coordination;Decreased knowledge of use of DME;Decreased safety awareness;Decreased knowledge of precautions       PT Treatment Interventions DME instruction;Balance training;Functional mobility training;Therapeutic activities;Therapeutic exercise;Gait training;Stair training;Neuromuscular re-education;Patient/family education    PT Goals (Current goals can be found in the Care Plan section)  Acute Rehab PT Goals PT Goal Formulation: Patient unable to participate in goal setting Time For Goal Achievement: 05/30/20 Potential to Achieve Goals: Good    Frequency Min 2X/week   Barriers to discharge   unclear what assist is available from family.    Co-evaluation               AM-PAC PT "6 Clicks" Mobility  Outcome Measure Help needed turning from your back to your side while in a flat bed without using bedrails?: None Help needed moving from lying on your back to sitting on the side of a flat bed without using bedrails?: A Little Help needed moving to and from a bed to a chair (including a wheelchair)?: A Little Help needed standing up from a chair using your arms (e.g., wheelchair or bedside chair)?: A Lot Help needed to walk in hospital room?: A Lot Help needed climbing 3-5 steps with a railing? : A Lot 6 Click Score: 16    End of Session Equipment Utilized During Treatment: Gait belt;Oxygen Activity Tolerance: Patient limited by fatigue;Patient limited by lethargy;Treatment limited secondary to agitation Patient left: in bed;with call bell/phone within reach Nurse Communication:  Mobility status PT Visit Diagnosis: Unsteadiness on feet (R26.81);Difficulty in walking, not elsewhere classified (R26.2);Ataxic gait (R26.0);Other symptoms and signs involving the nervous system (R29.898)    Time: 1191-4782 PT Time Calculation (min) (ACUTE ONLY): 16 min   Charges:   PT Evaluation $PT Eval Moderate Complexity: 1 Mod         11:03 AM, 05/16/20 Rosamaria Lints, PT, DPT Physical Therapist - [facility]  [phone] (ASCOM)   Claritza July C 05/16/2020, 11:00 AM

## 2020-05-16 NOTE — Consult Note (Signed)
Neurology Consultation Reason for Consult: Cerebellar strokes on MRI Requesting Physician: Loletha Grayer   CC: Dizziness   History is obtained from: patient and chart review   HPI: Peter Page is a 59 y.o. male with a past medical history significant for smoking (120-pack-year history of tobacco, followed by vape pen use until 3 weeks ago).   He reports that he was in his usual state of health until the morning of 11/14.  He was watching TV and all of a sudden felt dizzy.  When he tried to stand up he was so unsteady that he fell and injured his right eye.  He reports some general fatigue for the past week as well as his shortness of breath that started 4 weeks ago which prompted him quitting vaping.  He does feel that the shortness of breath improved with stopping vaping.  He denies any cardiac complaints, any new bowel or bladder issues, any numbness or focal weakness, any changes in his hearing, double vision, trouble with his speech or swallow.  He denies any infectious symptoms including fevers and denies any significant recent weight loss reporting a chronic poor appetite for many years.  He does not feel that he has had any change in his chronic cough lately.  He currently has a headache in the posterior part of his head, but denies any nausea, vomiting, light or sound sensitivity.  He reports he has been disabled since 2002 when he had a back accident leading to severe neck and leg pain.  He does note that he had a lesion on his nose that showed up without any clear etiology about 1 week prior to his fall.  He feels that it looks like a bug bite but denies any known bug bites or trauma, and reports nobody in his family has any similar skin lesions.  He reports he has never seen a physician and that Jesus is his doctor.  He is willing to take medications.  LKW:  tPA given?: No, due to out of the window   ROS: A 14 point ROS was performed and is negative except as noted in the HPI.    Past Medical History:  Diagnosis Date  . Asthma   . COPD (chronic obstructive pulmonary disease) ([place])    History reviewed. No pertinent surgical history.  History reviewed. No pertinent family history.  Social History:  reports that he quit smoking about 22 months ago. His smoking use included cigarettes. He has never used smokeless tobacco. He reports current alcohol use. He reports current drug use. Drug: Marijuana.   Exam: Current vital signs: BP 93/71 (BP Location: Left Arm)   Pulse 62   Temp 98.5 F (36.9 C) (Oral)   Resp 18   Ht 5' 4"  (7.341 m)   Wt 58 kg   SpO2 97%   BMI 21.95 kg/m  Vital signs in last 24 hours: Temp:  [98 F (36.7 C)-98.5 F (36.9 C)] 98.5 F (36.9 C) (11/15 1004) Pulse Rate:  [46-118] 62 (11/15 1004) Resp:  [14-25] 18 (11/15 1004) BP: (93-168)/(71-111) 93/71 (11/15 1004) SpO2:  [92 %-100 %] 97 % (11/15 1004) Weight:  [58 kg] 58 kg (11/14 1508)   Physical Exam  Constitutional: Appears well-developed and well-nourished.  Psych: Affect appropriate to situation Eyes: No scleral injection HENT: No OP obstrucion MSK: no joint deformities.  Cardiovascular: Normal rate and regular rhythm.  Respiratory: Effort normal, non-labored breathing GI: Soft.  No distension. There is no tenderness.  Skin: WDI  Neuro: Mental Status: Patient is awake, alert, oriented to person, and knows it is the 15th but cannot tell me the month or the year, nor is he able to tell me that he is here because he had a stroke until I asked him if anyone has told him that yet. No signs of aphasia or neglect, able to name, repeat, and follow multistep commands Cranial Nerves: II: Visual Fields are full. Pupils are equal, round, and reactive to light.  2 to 1 mm bilaterally III,IV, VI: EOMI without ptosis or diploplia.  V: Facial sensation is symmetric to temperature VII: Facial movement is symmetric.  VIII: hearing is intact to voice X: Uvula elevates symmetrically XI:  Shoulder shrug is symmetric. XII: tongue is midline without atrophy or fasciculations.  Motor: 5/5 strength was present in all four extremities.  Sensory: Sensation is symmetric to light touch and temperature in the arms and legs. Deep Tendon Reflexes: 3+ and symmetric in the biceps and patellae.  Plantars: Briskly withdraws bilaterally, likely downgoing but not fully clear Cerebellar: Finger-to-nose and heel-to-shin are dysmetric bilaterally, left worse than right.  On sitting on the side of the bed he reports feeling very dizzy and has some mild truncal ataxia.  For this reason gait was not attempted.  I have reviewed labs in epic and the results pertinent to this consultation are: No results found for: HGBA1C Lab Results  Component Value Date   CHOL 148 05/16/2020   HDL 40 (L) 05/16/2020   LDLCALC 84 05/16/2020   TRIG 118 05/16/2020   CHOLHDL 3.7 05/16/2020  Creatinine 0.98 CBC unremarkable   I have personally reviewed the images obtained: MRI brain with bilateral cerebellar strokes including cerebellar vermis stroke. 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  Recommendations:  # Cerebellar stroke - Stroke labs TSH, ESR, RPR, HgbA1c, fasting lipid panel - MRI brain completed and reviewed as above  - CTA completed on my order and reviewed as above  - Frequent neuro checks - Echocardiogram - Prophylactic therapy-Antiplatelet med: Aspirin - dose 384m PO or 3073mPR, followed by 81 mg daily - Plavix 300 mg load with 75 mg daily for a minimum of a 21 day course  - Agree with Atorvastatin 40 mg given  LDL just above goal of < 70  - Risk factor modification - Telemetry monitoring; 30 day event monitor on discharge if no arrythmias captured  - Blood pressure goal   - Permissive hypertension to 220/120 due to left vert occlusion for the next 24 hours  - Please lay flat, bolus IVF and activate code stroke for any acute decompensation  - PT consult, OT consult, Speech consult - Vascular surgery consult  - Neurology to follow  [phone]

## 2020-05-17 ENCOUNTER — Inpatient Hospital Stay (HOSPITAL_COMMUNITY)
Admit: 2020-05-17 | Discharge: 2020-05-17 | Disposition: A | Discharge status: Home / Self Care | Payer: Medicare Other | Attending: Internal Medicine | Admitting: Internal Medicine

## 2020-05-17 DIAGNOSIS — I6389 Other cerebral infarction: Secondary | ICD-10-CM | POA: Diagnosis not present

## 2020-05-17 DIAGNOSIS — E440 Moderate protein-calorie malnutrition: Secondary | ICD-10-CM | POA: Insufficient documentation

## 2020-05-17 DIAGNOSIS — R110 Nausea: Secondary | ICD-10-CM

## 2020-05-17 DIAGNOSIS — R42 Dizziness and giddiness: Secondary | ICD-10-CM | POA: Diagnosis not present

## 2020-05-17 DIAGNOSIS — R519 Headache, unspecified: Secondary | ICD-10-CM | POA: Diagnosis not present

## 2020-05-17 DIAGNOSIS — I639 Cerebral infarction, unspecified: Secondary | ICD-10-CM | POA: Diagnosis not present

## 2020-05-17 DIAGNOSIS — I10 Essential (primary) hypertension: Secondary | ICD-10-CM | POA: Diagnosis not present

## 2020-05-17 DIAGNOSIS — E876 Hypokalemia: Secondary | ICD-10-CM

## 2020-05-17 LAB — ECHOCARDIOGRAM COMPLETE
AR max vel: 2.61 cm2
AV Area VTI: 3 cm2
AV Area mean vel: 2.83 cm2
AV Mean grad: 3 mmHg
AV Peak grad: 5.7 mmHg
Ao pk vel: 1.19 m/s
Area-P 1/2: 4.19 cm2
Height: 64 in
S' Lateral: 2.03 cm
Weight: 2045.87 oz

## 2020-05-17 LAB — CBC WITH DIFFERENTIAL/PLATELET
Abs Immature Granulocytes: 0.02 10*3/uL (ref 0.00–0.07)
Basophils Absolute: 0.1 10*3/uL (ref 0.0–0.1)
Basophils Relative: 1 %
Eosinophils Absolute: 0 10*3/uL (ref 0.0–0.5)
Eosinophils Relative: 1 %
HCT: 41.8 % (ref 39.0–52.0)
Hemoglobin: 14.3 g/dL (ref 13.0–17.0)
Immature Granulocytes: 0 %
Lymphocytes Relative: 28 %
Lymphs Abs: 1.8 10*3/uL (ref 0.7–4.0)
MCH: 31.4 pg (ref 26.0–34.0)
MCHC: 34.2 g/dL (ref 30.0–36.0)
MCV: 91.7 fL (ref 80.0–100.0)
Monocytes Absolute: 0.4 10*3/uL (ref 0.1–1.0)
Monocytes Relative: 6 %
Neutro Abs: 4.1 10*3/uL (ref 1.7–7.7)
Neutrophils Relative %: 64 %
Platelets: 256 10*3/uL (ref 150–400)
RBC: 4.56 MIL/uL (ref 4.22–5.81)
RDW: 13.2 % (ref 11.5–15.5)
WBC: 6.4 10*3/uL (ref 4.0–10.5)
nRBC: 0 % (ref 0.0–0.2)

## 2020-05-17 LAB — APTT: aPTT: 30 seconds (ref 24–36)

## 2020-05-17 LAB — SEDIMENTATION RATE: Sed Rate: 9 mm/hr (ref 0–20)

## 2020-05-17 LAB — AST: AST: 24 U/L (ref 15–41)

## 2020-05-17 LAB — RETIC PANEL
Immature Retic Fract: 5.8 % (ref 2.3–15.9)
RBC.: 4.58 MIL/uL (ref 4.22–5.81)
Retic Count, Absolute: 55.4 10*3/uL (ref 19.0–186.0)
Retic Ct Pct: 1.2 % (ref 0.4–3.1)
Reticulocyte Hemoglobin: 36.4 pg (ref >27.9)

## 2020-05-17 LAB — CK: Total CK: 287 U/L (ref 49–397)

## 2020-05-17 LAB — BILIRUBIN, FRACTIONATED(TOT/DIR/INDIR)
Bilirubin, Direct: <0.1 mg/dL (ref 0.0–0.2)
Total Bilirubin: 0.9 mg/dL (ref 0.3–1.2)

## 2020-05-17 LAB — BASIC METABOLIC PANEL
Anion gap: 11 (ref 5–15)
BUN: 8 mg/dL (ref 6–20)
CO2: 21 mmol/L — ABNORMAL LOW (ref 22–32)
Calcium: 8.3 mg/dL — ABNORMAL LOW (ref 8.9–10.3)
Chloride: 105 mmol/L (ref 98–111)
Creatinine, Ser: 0.9 mg/dL (ref 0.61–1.24)
GFR, Estimated: >60 mL/min (ref >60)
Glucose, Bld: 95 mg/dL (ref 70–99)
Potassium: 3.4 mmol/L — ABNORMAL LOW (ref 3.5–5.1)
Sodium: 137 mmol/L (ref 135–145)

## 2020-05-17 LAB — PROTIME-INR
INR: 1 (ref 0.8–1.2)
Prothrombin Time: 12.4 seconds (ref 11.4–15.2)

## 2020-05-17 LAB — LACTATE DEHYDROGENASE: LDH: 178 U/L (ref 98–192)

## 2020-05-17 LAB — ALT: ALT: 17 U/L (ref 0–44)

## 2020-05-17 LAB — TSH: TSH: 0.872 u[IU]/mL (ref 0.350–4.500)

## 2020-05-17 MED ORDER — ENSURE ENLIVE PO LIQD
237.0000 mL | Freq: Two times a day (BID) | ORAL | Status: DC
  Administered 2020-05-18 (×2): 237 mL via ORAL

## 2020-05-17 MED ORDER — METOCLOPRAMIDE HCL 5 MG/ML IJ SOLN
5.0000 mg | Freq: Four times a day (QID) | INTRAMUSCULAR | Status: DC
  Administered 2020-05-17 – 2020-05-19 (×8): 5 mg via INTRAVENOUS
  Filled 2020-05-17 (×7): qty 2

## 2020-05-17 MED ORDER — MAGNESIUM SULFATE 2 GM/50ML IV SOLN
2.0000 g | Freq: Once | INTRAVENOUS | Status: AC
  Administered 2020-05-17: 11:00:00 2 g via INTRAVENOUS
  Filled 2020-05-17: qty 50

## 2020-05-17 MED ORDER — POTASSIUM CHLORIDE CRYS ER 20 MEQ PO TBCR
40.0000 meq | EXTENDED_RELEASE_TABLET | Freq: Once | ORAL | Status: AC
  Administered 2020-05-17: 15:00:00 40 meq via ORAL
  Filled 2020-05-17: qty 2

## 2020-05-17 NOTE — Progress Notes (Signed)
*  PRELIMINARY RESULTS* Echocardiogram 2D Echocardiogram has been performed.  Cristela Blue 05/17/2020, 8:39 AM

## 2020-05-17 NOTE — Progress Notes (Signed)
Physical Therapy Treatment Patient Details Name: Peter Page MRN: [id number] DOB: 1960/09/27 Today's Date: 05/17/2020    History of Present Illness Zalmen Wrightsman is a 59yoM who comes to [facility] ED after fall, found on porch by family, severe dizziness. 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    PT Comments    Pt in bed upon entyr, more aware and alert than previous day, still reports to feel poorly. Pt has little recollection of session from previous day. Pt able to AMB twice around unit with intermittent minA for righting assist and avoidance of fall to floor. Gait remains severely ataxic with crossover, pt remains somewhat impulsive at times. Session ended after pt becomes distressed in hallway, asks to sit, subsequent BP elevated 200/29mmHg, later 170s SBP after return to supine. MD made aware. Did not have a chance to attempt gait with RW.     Follow Up Recommendations  SNF;Supervision for mobility/OOB     Equipment Recommendations  Other (comment)    Recommendations for Other Services       Precautions / Restrictions Precautions Precautions: Fall Restrictions Weight Bearing Restrictions: No    Mobility  Bed Mobility Overal bed mobility: Needs Assistance Bed Mobility: Sit to Supine;Supine to Sit     Supine to sit: Supervision Sit to supine: Supervision   General bed mobility comments: limited awareness of lines/leads; impulsive; otherwise moves well without obvious weakness  Transfers Overall transfer level: Needs assistance   Transfers: Sit to/from Stand Sit to Stand: Min guard         General transfer comment: impulsive, stands prior to author being ready despite cues  Ambulation/Gait Ambulation/Gait assistance: Min Chemical engineer (Feet): 350 Feet Assistive device: None Gait Pattern/deviations: Ataxic;Scissoring     General Gait Details: impulsive, does best when AMB slow, but needs continuous cues; becomes dizzy and HA, then BP  assessed at 200/91 mmHg.   Stairs             Wheelchair Mobility    Modified Rankin (Stroke Patients Only)       Balance Overall balance assessment: Needs assistance Sitting-balance support: Feet unsupported;Single extremity supported Sitting balance-Leahy Scale: Fair     Standing balance support: No upper extremity supported;During functional activity Standing balance-Leahy Scale: Poor                              Cognition Arousal/Alertness: Awake/alert Behavior During Therapy: Agitated;Impulsive Overall Cognitive Status: No family/caregiver present to determine baseline cognitive functioning                                        Exercises      General Comments        Pertinent Vitals/Pain Pain Assessment: No/denies pain    Home Living                      Prior Function            PT Goals (current goals can now be found in the care plan section) Acute Rehab PT Goals Patient Stated Goal: to return home PT Goal Formulation: With patient Time For Goal Achievement: 05/30/20 Potential to Achieve Goals: Good Progress towards PT goals: Progressing toward goals    Frequency    Min 2X/week      PT Plan Current plan remains appropriate  Co-evaluation              AM-PAC PT "6 Clicks" Mobility   Outcome Measure  Help needed turning from your back to your side while in a flat bed without using bedrails?: None Help needed moving from lying on your back to sitting on the side of a flat bed without using bedrails?: A Little Help needed moving to and from a bed to a chair (including a wheelchair)?: A Little Help needed standing up from a chair using your arms (e.g., wheelchair or bedside chair)?: A Little Help needed to walk in hospital room?: A Little Help needed climbing 3-5 steps with a railing? : A Little 6 Click Score: 19    End of Session Equipment Utilized During Treatment: Gait belt Activity  Tolerance: Patient limited by fatigue;Patient limited by lethargy;Treatment limited secondary to agitation Patient left: in bed;with call bell/phone within reach Nurse Communication: Mobility status PT Visit Diagnosis: Unsteadiness on feet (R26.81);Difficulty in walking, not elsewhere classified (R26.2);Ataxic gait (R26.0);Other symptoms and signs involving the nervous system (O35.009)     Time: 3818-2993 PT Time Calculation (min) (ACUTE ONLY): 30 min  Charges:  $Gait Training: 23-37 mins                     12:41 PM, 05/17/20 Rosamaria Lints, PT, DPT Physical Therapist - [facility]  [phone] (ASCOM)   Nate Perri C 05/17/2020, 12:37 PM

## 2020-05-17 NOTE — Progress Notes (Addendum)
SLP Cancellation Note  Patient Details Name: Peter Page MRN: [id number] DOB: August 26, 1960   Cancelled treatment:       Reason Eval/Treat Not Completed: PT screened, no needs identified, will sign off.   Pt seen bedside for informal screening for any cognitive-linguistic needs; swallowing needs addressed as well. Pt was observed during informal conversation w/ family member Immunologist) via telephone-- no overt deficits in pt's speech or language engagement; conversation appeared WNL. During pt interview, pt reported no noted changes in his speech, language, or communication abilities. Pt is Missing most Dentition and wear partials which are at home -- lacking Dentition can impact precision of speech articulation. Per pt, he does not use text on his phone but rather calls his loved ones. Pt indicated no issues w/ writing at this time. When asked about swallowing abilities, pt indicated he is having some nausea which makes tolerating PO's difficult-- however, he denied any deficits w/ swallowing. No dysphagia reported. Pt was provided milk per his request.  No needs identified. ST services to sign off at this time. NSG to reconsult for further needs.     Oliver Pila  Graduate Clinician 05/17/2020, 11:13 AM    The information in this patient note, response to treatment, and overall treatment plan developed has been reviewed and agreed upon after reviewing documentation. This session was performed under the supervision of this licensed clinician.    Jerilynn Som, MS, CCC-SLP Speech Language Pathologist Rehab Services [phone]

## 2020-05-17 NOTE — Hospital Course (Addendum)
Admitted with bilateral acute cerebellar infarcts.  Seen by neurology.  Patient on aspirin Plavix and Lipitor.  Physical therapy recommending rehab.  Patient with nausea dizziness and headache.  Had to add Reglan and I gave IV magnesium.  Patient also on as needed Zofran.  11/17 @ 6:45 > Patient with worsening headaches this morning, describes as the "worst" headache of his life since he presented with his symptoms. CT head without contrast being obtained to evaluate if has developed acute intracranial abnormality. Will give, headache cocktail in an attempt to abort the pain with IV Toradol, steroid, Benadryl and IV magnesium.

## 2020-05-17 NOTE — Progress Notes (Signed)
Neurology Progress Note  Patient ID: Peter Page is a 59 y.o. with PMHx of  has a past medical history of Asthma and COPD (chronic obstructive pulmonary disease) ([place]).  Initially consulted for: Cerebellar stroke  Major interval events:  - Vascular surgery eval complete, planned for intervention on stenosis in 2 weeks   Subjective: - Headache improving - Balance improving  Exam: Vitals:   05/17/20 0405 05/17/20 0754  BP: (!) 154/107 (!) 155/89  Pulse: 73 (!) 46  Resp: 16 18  Temp: 98.6 F (37 C) 98.6 F (37 C)  SpO2: 98% 100%   Gen: In bed, comfortable  Resp: non-labored breathing, no grossly audible wheezing Cardiac: Perfusing extremities well  Abd: soft, nt  Neuro: MS: Sleeping but awoken easily, maintains alertness and is oriented to person/place/situation CN: External ocular movements more smooth than previously, no nystagmus, face symmetric, tongue midline Motor: Moving all 4 extremities equally spontaneously and to command Gait: Stands up much more readily, casual gait is steady  Pertinent Labs: No results found for: RPR  Lab Results  Component Value Date   CHOL 148 05/16/2020   HDL 40 (L) 05/16/2020   LDLCALC 84 05/16/2020   TRIG 118 05/16/2020   CHOLHDL 3.7 05/16/2020   Lab Results  Component Value Date   HGBA1C 5.6 05/16/2020   Lab Results  Component Value Date   TSH 0.872 05/17/2020    Lab Results  Component Value Date   ESRSEDRATE 9 05/17/2020    ECHO with mild dilatation of the aortic root, measuring 41 mm; otherwise normal  Impression: This is a 59 year old gentleman with limited prior healthcare with vascular risk factors of smoking presenting with a posterior fossa stroke likely atheroembolic from severe left subclavian stenosis, cannot exclude cardioembolic source fully, therefore recommend 30-day event monitor on discharge  Recommendations: # Cerebellar stroke likely secondary to left subclavian stenosis  - Stroke labs TSH   0.872, RPR pending - HgbA1c 5.6%, fasting lipid panel LDL 84, ESR 9  - Frequent neuro checks - Echocardiogram with mild dilatation of the aortic root, measuring 41 mm; otherwise normal - Continue lifelong : Aspirin - 81 mg daily - Plavix s/p 300 mg load with 75 mg daily for a minimum of a 90 day course (likely longer after stent placement) - Agree with Atorvastatin 40 mg given LDL just above goal of < 70  - Risk factor modification - Telemetry monitoring; 30 day event monitor on discharge - Blood pressure goal: gradual reduction by  ~10% daily until normotensive unless more liberal goal per vascular surgery  - PT consult, OT consult, Speech consult - Appreciate vascular surgery consult and plans for future intervention  - Needs referral for outpatient neurology follow-up  Lesleigh Noe MD-PhD [facility] [phone]

## 2020-05-17 NOTE — Progress Notes (Signed)
Initial Nutrition Assessment  DOCUMENTATION CODES:   Non-severe (moderate) malnutrition in context of chronic illness  INTERVENTION:  Provide Ensure Enlive po BID, each supplement provides 350 kcal and 20 grams of protein. Patient prefers chocolate.  NUTRITION DIAGNOSIS:   Moderate Malnutrition related to chronic illness (COPD) as evidenced by moderate fat depletion, moderate muscle depletion.  GOAL:   Patient will meet greater than or equal to 90% of their needs  MONITOR:   PO intake, Supplement acceptance, Labs, Weight trends, I & O's  REASON FOR ASSESSMENT:   Malnutrition Screening Tool    ASSESSMENT:   59 year old male with PMHx of COPD, asthma admitted with acute bilateral cerebellar strokes, left subclavian stenosis.   Met with patient at bedside with his nephew. He reports he has had anorexia lately (absence of hunger) but is typically still able to eat fairly well. He reports he eats 2 meals per day. Breakfast is usually a chicken biscuit and dinner is usually a meat with sides. Intake has been variable here. Patient eating 0-100% of meals. Discussed importance of adequate calorie and protein intake. Patient amenable to drinking oral nutrition supplements to help meet calorie/protein needs.  Patient reports his UBW is 125-130 lbs and that he is weight-stable. Patient currently 58 kg (127.87 lbs) per chart.  Medications reviewed and include: Reglan 5 mg Q6hrs IV, NS at 50 mL/hr.  Labs reviewed: Potassium 3.4, CO2 21.  NUTRITION - FOCUSED PHYSICAL EXAM:    Most Recent Value  Orbital Region Mild depletion  Upper Arm Region Moderate depletion  Thoracic and Lumbar Region Mild depletion  Buccal Region Moderate depletion  Temple Region Moderate depletion  Clavicle Bone Region Mild depletion  Clavicle and Acromion Bone Region Mild depletion  Scapular Bone Region No depletion  Dorsal Hand Moderate depletion  Patellar Region Unable to assess  [patient wearing jeans]    Anterior Thigh Region Unable to assess  [patient wearing jeans]  Posterior Calf Region Moderate depletion  Edema (RD Assessment) None  Hair Reviewed  Eyes Reviewed  Mouth Reviewed  Skin Reviewed  Nails Reviewed     Diet Order:   Diet Order            Diet Heart Room service appropriate? Yes; Fluid consistency: Thin  Diet effective now                EDUCATION NEEDS:   No education needs have been identified at this time  Skin:  Skin Assessment: Reviewed RN Assessment  Last BM:  05/16/2020 per chart  Height:   Ht Readings from Last 1 Encounters:  05/15/20 _0  (1.626 m)   Weight:   Wt Readings from Last 1 Encounters:  05/15/20 58 kg   BMI:  Body mass index is 21.95 kg/m.  Estimated Nutritional Needs:   Kcal:  1700-1900  Protein:  85-95 grams  Fluid:  1.7-1.9 L/day  Jacklynn Barnacle, MS, RD, LDN Pager number available on Amion

## 2020-05-17 NOTE — Progress Notes (Signed)
Patient ID: Peter Page, male   DOB: October 25, 1960, 59 y.o.   MRN: [id number] [facility] PROGRESS NOTE  Jaevin Medearis WUJ:[id number] DOB: 1961-03-19 DOA: 05/15/2020 PCP: Patient, No Pcp Per  HPI/Subjective: Patient this morning had an episode where he sat up and needed to lie back down because he became dizzy and nauseous.  Also complains of headache.  The patient later was seen walking around with physical therapy around the nursing station a few times.  Patient states that he had a bite on his nose that started off as a spot and now a larger area of blackish scab.  Objective: Vitals:   05/17/20 0929 05/17/20 1124  BP: (!) 200/91 140/84  Pulse:  63  Resp:  19  Temp:  98.7 F (37.1 C)  SpO2:  95%    Intake/Output Summary (Last 24 hours) at 05/17/2020 1500 Last data filed at 05/17/2020 1343 Gross per 24 hour  Intake 974.39 ml  Output 650 ml  Net 324.39 ml   Filed Weights   05/15/20 1508  Weight: 58 kg    ROS: Review of Systems  Respiratory: Negative for shortness of breath.   Cardiovascular: Negative for chest pain.  Gastrointestinal: Positive for nausea. Negative for abdominal pain and vomiting.  Musculoskeletal: Negative for joint pain.  Neurological: Positive for dizziness and headaches.   Exam: Physical Exam HENT:     Head: Normocephalic.     Mouth/Throat:     Pharynx: No oropharyngeal exudate.  Eyes:     General: Lids are normal.     Extraocular Movements: Extraocular movements intact.     Conjunctiva/sclera: Conjunctivae normal.     Pupils: Pupils are equal, round, and reactive to light.  Cardiovascular:     Rate and Rhythm: Normal rate and regular rhythm.     Heart sounds: Normal heart sounds, S1 normal and S2 normal.  Pulmonary:     Breath sounds: No decreased breath sounds, wheezing, rhonchi or rales.  Abdominal:     Palpations: Abdomen is soft.     Tenderness: There is no abdominal tenderness.  Musculoskeletal:     Right ankle: No  swelling.     Left ankle: No swelling.  Skin:    General: Skin is warm.     Comments: Blackish scab seen on the nose.  Patient stated it started as a bite but does not know what bit him.  Neurological:     Mental Status: He is alert and oriented to person, place, and time.     Comments: Finger-nose bilaterally intact today. Heel-shin rapid movements slightly off both legs. Babinski positive bilaterally. Power today 5 out of 5 bilaterally.       Data Reviewed: Basic Metabolic Panel: Recent Labs  Lab 05/15/20 1609 05/16/20 0235 05/17/20 0725  NA 140  --  137  K 3.7  --  3.4*  CL 107  --  105  CO2 21*  --  21*  GLUCOSE 145*  --  95  BUN 12  --  8  CREATININE 0.91 0.98 0.90  CALCIUM 8.2*  --  8.3*   Liver Function Tests: Recent Labs  Lab 05/15/20 1609 05/17/20 0725  AST 31 24  ALT 20 17  ALKPHOS 72  --   BILITOT 0.8 0.9  PROT 6.6  --   ALBUMIN 3.6  --    Recent Labs  Lab 05/15/20 1609  LIPASE 21   CBC: Recent Labs  Lab 05/15/20 1609 05/16/20 0235 05/17/20 0725  WBC 8.6  7.0 6.4  NEUTROABS 6.2  --  4.1  HGB 14.0 12.6* 14.3  HCT 41.3 38.4* 41.8  MCV 92.4 93.9 91.7  PLT 304 242 256   Cardiac Enzymes: Recent Labs  Lab 05/17/20 0725  CKTOTAL 287     Recent Results (from the past 240 hour(s))  Respiratory Panel by RT PCR (Flu A&B, Covid) - Nasopharyngeal Swab     Status: None   Collection Time: 05/15/20  9:02 PM   Specimen: Nasopharyngeal Swab  Result Value Ref Range Status   SARS Coronavirus 2 by RT PCR NEGATIVE NEGATIVE Final    Comment: (NOTE) SARS-CoV-2 target nucleic acids are NOT DETECTED.  The SARS-CoV-2 RNA is generally detectable in upper respiratoy specimens during the acute phase of infection. The lowest concentration of SARS-CoV-2 viral copies this assay can detect is 131 copies/mL. A negative result does not preclude SARS-Cov-2 infection and should not be used as the sole basis for treatment or other patient management decisions. A  negative result may occur with  improper specimen collection/handling, submission of specimen other than nasopharyngeal swab, presence of viral mutation(s) within the areas targeted by this assay, and inadequate number of viral copies (<131 copies/mL). A negative result must be combined with clinical observations, patient history, and epidemiological information. The expected result is Negative.  Fact Sheet for Patients:  https://www.moore.com/  Fact Sheet for Healthcare Providers:  https://www.young.biz/  This test is no t yet approved or cleared by the [place] FDA and  has been authorized for detection and/or diagnosis of SARS-CoV-2 by FDA under an Emergency Use Authorization (EUA). This EUA will remain  in effect (meaning this test can be used) for the duration of the COVID-19 declaration under Section 564(b)(1) of the Act, 21 U.S.C. section 360bbb-3(b)(1), unless the authorization is terminated or revoked sooner.     Influenza A by PCR NEGATIVE NEGATIVE Final   Influenza B by PCR NEGATIVE NEGATIVE Final    Comment: (NOTE) The Xpert Xpress SARS-CoV-2/FLU/RSV assay is intended as an aid in  the diagnosis of influenza from Nasopharyngeal swab specimens and  should not be used as a sole basis for treatment. Nasal washings and  aspirates are unacceptable for Xpert Xpress SARS-CoV-2/FLU/RSV  testing.  Fact Sheet for Patients: https://www.moore.com/  Fact Sheet for Healthcare Providers: https://www.young.biz/  This test is not yet approved or cleared by the [place] FDA and  has been authorized for detection and/or diagnosis of SARS-CoV-2 by  FDA under an Emergency Use Authorization (EUA). This EUA will remain  in effect (meaning this test can be used) for the duration of the  Covid-19 declaration under Section 564(b)(1) of the Act, 21  U.S.C. section 360bbb-3(b)(1), unless the authorization  is  terminated or revoked. Performed at [facility], [address], [place], [place] [address]      Studies: CT Code Stroke CTA Head W/WO contrast  Result Date: 05/16/2020 CLINICAL DATA:  Dizziness and vertigo. 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 EXAM: CT ANGIOGRAPHY HEAD AND NECK TECHNIQUE: Multidetector CT imaging of the head and neck was performed using the standard protocol during bolus administration of intravenous contrast. Multiplanar CT image reconstructions and MIPs were obtained to evaluate the vascular anatomy. Carotid stenosis measurements (when applicable) are obtained utilizing NASCET criteria, using the distal internal carotid diameter as the denominator. CONTRAST:  75mL OMNIPAQUE IOHEXOL 350 MG/ML SOLN COMPARISON:  05/15/2020 FINDINGS: CT HEAD FINDINGS Brain: Small areas of low-density visible within the cerebellum, particularly in the region of the  vermis. No evidence of mass effect or hemorrhage. Cerebral hemispheres appear normal by CT. No evidence of stroke, mass, hemorrhage, hydrocephalus or extra-axial collection. Vascular: There is atherosclerotic calcification of the major vessels at the base of the brain. Skull: Negative Sinuses: Clear Orbits: Normal Review of the MIP images confirms the above findings CTA NECK FINDINGS Aortic arch: Aortic atherosclerotic calcification. Branching pattern is normal without origin stenosis. Right carotid system: Common carotid artery widely patent to the bifurcation. Soft and calcified plaque at the ICA bulb but without stenosis when compared to the more distal cervical ICA diameter. Left carotid system: Common carotid artery widely patent to the bifurcation. No soft or calcified plaque at the bifurcation or bulb. Cervical ICA widely patent. Vertebral arteries: Right subclavian artery is normal. Right vertebral origin is widely patent. 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  This appears to be due to soft plaque. Left vertebral artery origin is widely patent. Left vertebral artery is normal through the cervical region to the foramen magnum. Skeleton: Degenerative cervical spondylosis and facet osteoarthritis. Other neck: No soft tissue mass or lymphadenopathy. Upper chest: Severe emphysema in the upper lungs. Review of the MIP images confirms the above findings CTA HEAD FINDINGS Anterior circulation: Both internal carotid arteries patent through the skull base and siphon regions. Ordinary siphon atherosclerotic calcification but no stenosis greater than 30%. The anterior and middle cerebral vessels are patent without proximal stenosis, aneurysm or vascular malformation. No large or medium vessel occlusion. Posterior circulation: Right vertebral artery is a large vessel widely patent through the foramen magnum. There is calcified plaque affecting the right vertebral V4 segment but no stenosis. I think there is a very small right PICA. 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 No antegrade flow through the foramen magnum. No left PICA demonstrated. No basilar stenosis. Small bilateral anterior inferior cerebellar arteries show flow. Superior cerebellar and posterior cerebral arteries are patent. Venous sinuses: Patent and normal. Anatomic variants: None significant. Review of the MIP images confirms the above findings IMPRESSION: 1. 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 2. No significant carotid bifurcation disease. 3. No intracranial anterior circulation large or medium vessel occlusion. 4. 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 No antegrade flow through the foramen magnum. Right vertebral artery widely patent. No basilar stenosis. Superior cerebellar and  posterior cerebral arteries are patent. No left PICA flow demonstrated. 5. Severe emphysema. Aortic Atherosclerosis (ICD10-I70.0) and Emphysema (ICD10-J43.9). Electronically Signed   By: Paulina Fusi M.D.   On: 05/16/2020 11:03   DG Orbits  Result Date: 05/15/2020 CLINICAL DATA:  Preprocedure clearance for MRI, syncope, dizziness EXAM: ORBITS - COMPLETE 4+ VIEW COMPARISON:  None. FINDINGS: There is no evidence of metallic foreign body within the orbits. No significant bone abnormality identified. IMPRESSION: No evidence of metallic foreign body within the orbits. Electronically Signed   By: Sharlet Salina M.D.   On: 05/15/2020 22:35   CT Code Stroke CTA Neck W/WO contrast  Result Date: 05/16/2020 CLINICAL DATA:  Dizziness and vertigo. 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 EXAM: CT ANGIOGRAPHY HEAD AND NECK TECHNIQUE: Multidetector CT imaging of the head and neck was performed using the standard protocol during bolus administration of intravenous contrast. Multiplanar CT image reconstructions and MIPs were obtained to evaluate the  vascular anatomy. Carotid stenosis measurements (when applicable) are obtained utilizing NASCET criteria, using the distal internal carotid diameter as the denominator. CONTRAST:  60mL OMNIPAQUE IOHEXOL 350 MG/ML SOLN COMPARISON:  05/15/2020 FINDINGS: CT HEAD FINDINGS Brain: Small areas of low-density visible within the cerebellum, particularly in the region of the vermis. No evidence of mass effect or hemorrhage. Cerebral hemispheres appear normal by CT. No evidence of stroke, mass, hemorrhage, hydrocephalus or extra-axial collection. Vascular: There is atherosclerotic calcification of the major vessels at the base of the brain. Skull: Negative Sinuses: Clear Orbits: Normal Review of the MIP images confirms the above findings CTA NECK FINDINGS Aortic arch: Aortic atherosclerotic calcification. Branching pattern is normal without origin stenosis. Right carotid  system: Common carotid artery widely patent to the bifurcation. Soft and calcified plaque at the ICA bulb but without stenosis when compared to the more distal cervical ICA diameter. Left carotid system: Common carotid artery widely patent to the bifurcation. No soft or calcified plaque at the bifurcation or bulb. Cervical ICA widely patent. Vertebral arteries: Right subclavian artery is normal. Right vertebral origin is widely patent. 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 This appears to be due to soft plaque. Left vertebral artery origin is widely patent. Left vertebral artery is normal through the cervical region to the foramen magnum. Skeleton: Degenerative cervical spondylosis and facet osteoarthritis. Other neck: No soft tissue mass or lymphadenopathy. Upper chest: Severe emphysema in the upper lungs. Review of the MIP images confirms the above findings CTA HEAD FINDINGS Anterior circulation: Both internal carotid arteries patent through the skull base and siphon regions. Ordinary siphon atherosclerotic calcification but no stenosis greater than 30%. The anterior and middle cerebral vessels are patent without proximal stenosis, aneurysm or vascular malformation. No large or medium vessel occlusion. Posterior circulation: Right vertebral artery is a large vessel widely patent through the foramen magnum. There is calcified plaque affecting the right vertebral V4 segment but no stenosis. I think there is a very small right PICA. 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 No antegrade flow through the foramen magnum. No left PICA demonstrated. No basilar stenosis. Small bilateral anterior inferior cerebellar arteries show flow. Superior cerebellar and posterior cerebral arteries are patent. Venous sinuses: Patent and normal. Anatomic variants: None significant. Review of the MIP images confirms the above  findings IMPRESSION: 1. 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 2. No significant carotid bifurcation disease. 3. No intracranial anterior circulation large or medium vessel occlusion. 4. 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 No antegrade flow through the foramen magnum. Right vertebral artery widely patent. No basilar stenosis. Superior cerebellar and posterior cerebral arteries are patent. No left PICA flow demonstrated. 5. Severe emphysema. Aortic Atherosclerosis (ICD10-I70.0) and Emphysema (ICD10-J43.9). Electronically Signed   By: Paulina Fusi M.D.   On: 05/16/2020 11:03   MR ANGIO HEAD WO CONTRAST  Result Date: 05/16/2020 CLINICAL DATA:  Dizziness, vertigo EXAM: MRI HEAD WITHOUT CONTRAST MRA HEAD WITHOUT CONTRAST TECHNIQUE: Multiplanar, multiecho pulse sequences of the brain and surrounding structures were obtained without intravenous contrast. Angiographic images of the head were obtained using MRA technique without contrast. COMPARISON:  None. FINDINGS: MRI HEAD FINDINGS Brain: Multifocal acute ischemia within both cerebellar hemispheres. Multifocal hyperintense T2-weighted signal within the white matter. Normal volume of CSF  spaces. No chronic microhemorrhage. Normal midline structures. Vascular: Normal flow voids. Skull and upper cervical spine: Normal marrow signal. Sinuses/Orbits: Negative. Other: None. MRA HEAD FINDINGS POSTERIOR CIRCULATION: --Vertebral arteries: Left vertebral artery terminates in PICA. --Inferior cerebellar arteries: Intermittent loss of enhancement in the left PICA. No right PICA enhancement visualized. --Basilar artery: Normal. --Superior cerebellar arteries: Normal. --Posterior cerebral arteries: Normal. ANTERIOR CIRCULATION: --Intracranial internal carotid arteries: Normal. --Anterior cerebral arteries  (ACA): Normal. --Middle cerebral arteries (MCA): Normal. ANATOMIC VARIANTS: Fetal origin of the right PCA. IMPRESSION: 1. Multifocal acute ischemia within both cerebellar hemispheres. No hemorrhage or mass effect. 2. 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 Electronically Signed   By: Deatra Robinson M.D.   On: 05/16/2020 00:07   MR BRAIN WO CONTRAST  Result Date: 05/16/2020 CLINICAL DATA:  Dizziness, vertigo EXAM: MRI HEAD WITHOUT CONTRAST MRA HEAD WITHOUT CONTRAST TECHNIQUE: Multiplanar, multiecho pulse sequences of the brain and surrounding structures were obtained without intravenous contrast. Angiographic images of the head were obtained using MRA technique without contrast. COMPARISON:  None. FINDINGS: MRI HEAD FINDINGS Brain: Multifocal acute ischemia within both cerebellar hemispheres. Multifocal hyperintense T2-weighted signal within the white matter. Normal volume of CSF spaces. No chronic microhemorrhage. Normal midline structures. Vascular: Normal flow voids. Skull and upper cervical spine: Normal marrow signal. Sinuses/Orbits: Negative. Other: None. MRA HEAD FINDINGS POSTERIOR CIRCULATION: --Vertebral arteries: Left vertebral artery terminates in PICA. --Inferior cerebellar arteries: Intermittent loss of enhancement in the left PICA. No right PICA enhancement visualized. --Basilar artery: Normal. --Superior cerebellar arteries: Normal. --Posterior cerebral arteries: Normal. ANTERIOR CIRCULATION: --Intracranial internal carotid arteries: Normal. --Anterior cerebral arteries (ACA): Normal. --Middle cerebral arteries (MCA): Normal. ANATOMIC VARIANTS: Fetal origin of the right PCA. IMPRESSION: 1. Multifocal acute ischemia within both cerebellar hemispheres. No hemorrhage or mass effect. 2. 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 Electronically Signed   By: Deatra Robinson M.D.   On: 05/16/2020 00:07   US Carotid Bilateral (at [facility] and [facility] only)  Result Date:  05/16/2020 CLINICAL DATA:  Stroke. EXAM: BILATERAL CAROTID DUPLEX ULTRASOUND TECHNIQUE: Wallace Cullens scale imaging, color Doppler and duplex ultrasound were performed of bilateral carotid and vertebral arteries in the neck. COMPARISON:  CTA neck 05/16/2020 FINDINGS: Criteria: Quantification of carotid stenosis is based on velocity parameters that correlate the residual internal carotid diameter with NASCET-based stenosis levels, using the diameter of the distal internal carotid lumen as the denominator for stenosis measurement. The following velocity measurements were obtained: RIGHT ICA: 87/29 cm/sec CCA: 69/18 cm/sec SYSTOLIC ICA/CCA RATIO:  1.3 ECA: 89 cm/sec LEFT ICA: 111/38 cm/sec CCA: 84/21 cm/sec SYSTOLIC ICA/CCA RATIO:  1.3 ECA: 106 cm/sec RIGHT CAROTID ARTERY: Small amount of plaque in the distal common carotid artery and carotid bulb. External carotid artery is patent with normal waveform. Small amount of plaque in the proximal internal carotid artery. Normal waveforms and velocities in the internal carotid artery. RIGHT VERTEBRAL ARTERY: Antegrade flow and normal waveform in the right vertebral artery. LEFT CAROTID ARTERY: Intimal thickening at the left carotid bulb. External carotid artery is patent with normal waveform. Normal waveforms and velocities in the internal carotid artery. LEFT VERTEBRAL ARTERY: Antegrade flow and normal waveform in the left vertebral artery. IMPRESSION: 1. Small amount of atherosclerotic plaque involving the carotid arteries, most prominent at the right carotid bulb region. Estimated degree of stenosis in the internal carotid arteries is less than 50% bilaterally. 2. Patent vertebral arteries with antegrade flow. Electronically Signed   By: Meriel Pica.D.  On: 05/16/2020 12:04   DG Chest Portable 1 View  Result Date: 05/15/2020 CLINICAL DATA:  Syncope, dizziness EXAM: PORTABLE CHEST 1 VIEW COMPARISON:  None. FINDINGS: 2 frontal views of the chest demonstrate an unremarkable  cardiac silhouette. Diffuse interstitial prominence and scarring throughout the lungs compatible with emphysema. No airspace disease, effusion, or pneumothorax. No acute bony abnormalities. IMPRESSION: 1. Emphysema.  No acute airspace disease. Electronically Signed   By: Sharlet Salina M.D.   On: 05/15/2020 17:42   ECHOCARDIOGRAM COMPLETE  Result Date: 05/17/2020    ECHOCARDIOGRAM REPORT   Patient Name:   Tramond RAY Harshfield Date of Exam: 05/17/2020 Medical Rec #:  [id number]         Height:       64.0 in Accession #:    [id number]        Weight:       127.9 lb Date of Birth:  03-15-1961         BSA:          1.618 m Patient Age:    59 years          BP:           155/89 mmHg Patient Gender: M                 HR:           46 bpm. Exam Location:  [facility] Procedure: 2D Echo, Cardiac Doppler and Color Doppler Indications:    Stroke 434.91  History:        Patient has no prior history of Echocardiogram examinations.                 COPD.  Sonographer:    Cristela Blue RDCS (AE) Referring Phys: [id number] Andris Baumann Diagnosing      Yvonne Kendall MD Phys:  Sonographer Comments: Global longitudinal strain was attempted. IMPRESSIONS  1. Left ventricular ejection fraction, by estimation, is 60 to 65%. The left ventricle has normal function. Left ventricular endocardial border not optimally defined to evaluate regional wall motion. Left ventricular diastolic parameters were normal.  2. Right ventricular systolic function is normal. The right ventricular size is normal. There is normal pulmonary artery systolic pressure.  3. The mitral valve is normal in structure. Trivial mitral valve regurgitation. No evidence of mitral stenosis.  4. The aortic valve was not well visualized. Aortic valve regurgitation is not visualized. No aortic stenosis is present.  5. Aortic dilatation noted. There is mild dilatation of the aortic root, measuring 41 mm.  6. The inferior vena cava is normal in size with greater than 50% respiratory  variability, suggesting right atrial pressure of 3 mmHg. FINDINGS  Left Ventricle: Left ventricular ejection fraction, by estimation, is 60 to 65%. The left ventricle has normal function. Left ventricular endocardial border not optimally defined to evaluate regional wall motion. The left ventricular internal cavity size was normal in size. There is borderline left ventricular hypertrophy. Left ventricular diastolic parameters were normal. Right Ventricle: The right ventricular size is normal. No increase in right ventricular wall thickness. Right ventricular systolic function is normal. There is normal pulmonary artery systolic pressure. The tricuspid regurgitant velocity is 2.09 m/s, and  with an assumed right atrial pressure of 3 mmHg, the estimated right ventricular systolic pressure is 20.5 mmHg. Left Atrium: Left atrial size was normal in size. Right Atrium: Right atrial size was normal in size. Pericardium: There is no evidence of pericardial effusion. Mitral Valve: The mitral valve  is normal in structure. Trivial mitral valve regurgitation. No evidence of mitral valve stenosis. Tricuspid Valve: The tricuspid valve is normal in structure. Tricuspid valve regurgitation is trivial. Aortic Valve: The aortic valve was not well visualized. Aortic valve regurgitation is not visualized. No aortic stenosis is present. Aortic valve mean gradient measures 3.0 mmHg. Aortic valve peak gradient measures 5.7 mmHg. Aortic valve area, by VTI measures 3.00 cm. Pulmonic Valve: The pulmonic valve was not well visualized. Pulmonic valve regurgitation is not visualized. No evidence of pulmonic stenosis. Aorta: Aortic dilatation noted. There is mild dilatation of the aortic root, measuring 41 mm. Pulmonary Artery: The pulmonary artery is not well seen. Venous: The inferior vena cava is normal in size with greater than 50% respiratory variability, suggesting right atrial pressure of 3 mmHg. IAS/Shunts: The interatrial septum was  not well visualized.  LEFT VENTRICLE PLAX 2D LVIDd:         4.03 cm  Diastology LVIDs:         2.03 cm  LV e' medial:    7.72 cm/s LV PW:         1.16 cm  LV E/e' medial:  10.9 LV IVS:        0.84 cm  LV e' lateral:   10.30 cm/s LVOT diam:     2.20 cm  LV E/e' lateral: 8.2 LV SV:         68 LV SV Index:   42 LVOT Area:     3.80 cm                          3D Volume EF:                         3D EF:        60 %                         LV EDV:       119 ml                         LV ESV:       48 ml                         LV SV:        72 ml RIGHT VENTRICLE RV Basal diam:  2.78 cm RV S prime:     13.80 cm/s TAPSE (M-mode): 2.0 cm LEFT ATRIUM             Index       RIGHT ATRIUM           Index LA diam:        3.80 cm 2.35 cm/m  RA Area:     15.50 cm LA Vol (A2C):   39.5 ml 24.42 ml/m RA Volume:   42.10 ml  26.03 ml/m LA Vol (A4C):   25.9 ml 16.01 ml/m LA Biplane Vol: 33.5 ml 20.71 ml/m  AORTIC VALVE                   PULMONIC VALVE AV Area (Vmax):    2.61 cm    PV Vmax:        0.79 m/s AV Area (Vmean):   2.83 cm    PV Peak grad:   2.5 mmHg AV Area (VTI):  3.00 cm    RVOT Peak grad: 3 mmHg AV Vmax:           119.00 cm/s AV Vmean:          70.900 cm/s AV VTI:            0.228 m AV Peak Grad:      5.7 mmHg AV Mean Grad:      3.0 mmHg LVOT Vmax:         81.60 cm/s LVOT Vmean:        52.800 cm/s LVOT VTI:          0.180 m LVOT/AV VTI ratio: 0.79  AORTA Ao Root diam: 4.00 cm MITRAL VALVE               TRICUSPID VALVE MV Area (PHT): 4.19 cm    TR Peak grad:   17.5 mmHg MV Decel Time: 181 msec    TR Vmax:        209.00 cm/s MV E velocity: 84.00 cm/s MV A velocity: 59.60 cm/s  SHUNTS MV E/A ratio:  1.41        Systemic VTI:  0.18 m                            Systemic Diam: 2.20 cm Yvonne Kendall MD Electronically signed by Yvonne Kendall MD Signature Date/Time: 05/17/2020/2:10:37 PM    Final     Scheduled Meds: . aspirin EC  81 mg Oral Daily  . atorvastatin  40 mg Oral Daily  . clopidogrel  75 mg Oral  Daily  . enoxaparin (LOVENOX) injection  40 mg Subcutaneous Q24H  . metoCLOPramide (REGLAN) injection  5 mg Intravenous Q6H  . neomycin-bacitracin-polymyxin   Topical BID   Continuous Infusions: . sodium chloride 50 mL/hr at 05/17/20 1028    Assessment/Plan:  1. Acute bilateral cerebellar strokes.  Patient on aspirin, Plavix and atorvastatin.  Appreciate neurology consultation.  Physical therapy recommending rehab.  As transitional care team to look into acute rehab. 2. Nausea, dizziness and headache.  I gave a dose of IV magnesium this morning added Reglan to the patient's Zofran.  All these likely secondary to the strokes. 3. Essential hypertension.  Allow permissive hypertension.  Blood pressure in left arm a lot lower secondary to subclavian stenosis.  Asked nursing staff to take blood pressures in right arm.  Blood pressure variable today holding off on antihypertensives today but may have to start tomorrow. 4. Left subclavian stenosis.  Appreciate vascular surgery consultation.  Likely will need intervention in a few weeks. 5. COPD seen on chest x-ray. 6. Bite on nose with blackish scab.  Neosporin ointment.  White blood cell count in the normal range, no signs of hemolysis, hold off on any antibiotics at this point and continue to monitor. 7. Impaired fasting glucose.  Patient not a diabetic with hemoglobin A1c being 5.6. 8. Hypokalemia replace potassium today.     Code Status:     Code Status Orders  (From admission, onward)         Start     Ordered   05/16/20 0212  Full code  Continuous        05/16/20 0213        Code Status History    This patient has a current code status but no historical code status.   Advance Care Planning Activity     Family Communication: Left message for patient's daughter Disposition  Plan: Status is: Inpatient  Dispo: The patient is from: Home              Anticipated d/c is to: Rehab              Anticipated d/c date is: Likely a  couple days              Patient currently this morning had nausea headache and dizziness just with lifting his head up.  I did start some IV Reglan.  Continue to monitor.  Treating for acute bilateral cerebellar infarcts with aspirin Plavix and atorvastatin.  Consultants:  Neurology  Time spent: 28 minutes  [employer]  [facility]

## 2020-05-18 ENCOUNTER — Inpatient Hospital Stay: Payer: Medicare Other

## 2020-05-18 DIAGNOSIS — I639 Cerebral infarction, unspecified: Secondary | ICD-10-CM | POA: Diagnosis not present

## 2020-05-18 DIAGNOSIS — G441 Vascular headache, not elsewhere classified: Secondary | ICD-10-CM | POA: Diagnosis not present

## 2020-05-18 LAB — BASIC METABOLIC PANEL
Anion gap: 9 (ref 5–15)
BUN: 10 mg/dL (ref 6–20)
CO2: 23 mmol/L (ref 22–32)
Calcium: 8.5 mg/dL — ABNORMAL LOW (ref 8.9–10.3)
Chloride: 105 mmol/L (ref 98–111)
Creatinine, Ser: 0.83 mg/dL (ref 0.61–1.24)
GFR, Estimated: >60 mL/min (ref >60)
Glucose, Bld: 101 mg/dL — ABNORMAL HIGH (ref 70–99)
Potassium: 4 mmol/L (ref 3.5–5.1)
Sodium: 137 mmol/L (ref 135–145)

## 2020-05-18 LAB — MAGNESIUM: Magnesium: 2.2 mg/dL (ref 1.7–2.4)

## 2020-05-18 LAB — RPR: RPR Ser Ql: NONREACTIVE

## 2020-05-18 MED ORDER — KETOROLAC TROMETHAMINE 30 MG/ML IJ SOLN
30.0000 mg | Freq: Once | INTRAMUSCULAR | Status: AC
  Administered 2020-05-18: 07:00:00 30 mg via INTRAVENOUS
  Filled 2020-05-18: qty 1

## 2020-05-18 MED ORDER — FAMOTIDINE 20 MG PO TABS
20.0000 mg | ORAL_TABLET | Freq: Two times a day (BID) | ORAL | Status: DC
  Administered 2020-05-18 – 2020-05-19 (×3): 20 mg via ORAL
  Filled 2020-05-18 (×2): qty 1

## 2020-05-18 MED ORDER — DIPHENHYDRAMINE HCL 50 MG/ML IJ SOLN
12.5000 mg | Freq: Once | INTRAMUSCULAR | Status: AC
  Administered 2020-05-18: 07:00:00 12.5 mg via INTRAVENOUS
  Filled 2020-05-18: qty 1

## 2020-05-18 MED ORDER — METHYLPREDNISOLONE SODIUM SUCC 40 MG IJ SOLR
40.0000 mg | Freq: Once | INTRAMUSCULAR | Status: AC
  Administered 2020-05-18: 40 mg via INTRAVENOUS
  Filled 2020-05-18: qty 1

## 2020-05-18 MED ORDER — KETOROLAC TROMETHAMINE 30 MG/ML IJ SOLN
15.0000 mg | Freq: Once | INTRAMUSCULAR | Status: AC
  Administered 2020-05-18: 14:00:00 15 mg via INTRAVENOUS
  Filled 2020-05-18: qty 1

## 2020-05-18 MED ORDER — OXYCODONE HCL 5 MG PO TABS
2.5000 mg | ORAL_TABLET | Freq: Four times a day (QID) | ORAL | Status: DC | PRN
  Administered 2020-05-18: 22:00:00 2.5 mg via ORAL
  Filled 2020-05-18: qty 1

## 2020-05-18 MED ORDER — DIPHENHYDRAMINE HCL 50 MG/ML IJ SOLN
12.5000 mg | Freq: Once | INTRAMUSCULAR | Status: DC
  Filled 2020-05-18: qty 1

## 2020-05-18 MED ORDER — ACETAMINOPHEN 500 MG PO TABS
1000.0000 mg | ORAL_TABLET | Freq: Four times a day (QID) | ORAL | Status: DC
  Administered 2020-05-18 – 2020-05-19 (×4): 1000 mg via ORAL
  Filled 2020-05-18 (×4): qty 2

## 2020-05-18 MED ORDER — AMLODIPINE BESYLATE 5 MG PO TABS
5.0000 mg | ORAL_TABLET | Freq: Every day | ORAL | Status: DC
  Administered 2020-05-18 – 2020-05-19 (×2): 5 mg via ORAL
  Filled 2020-05-18: qty 1

## 2020-05-18 MED ORDER — MAGNESIUM SULFATE 2 GM/50ML IV SOLN
2.0000 g | Freq: Once | INTRAVENOUS | Status: AC
  Administered 2020-05-18: 07:00:00 2 g via INTRAVENOUS
  Filled 2020-05-18: qty 50

## 2020-05-18 MED ORDER — METHYLPREDNISOLONE SODIUM SUCC 40 MG IJ SOLR
40.0000 mg | Freq: Once | INTRAMUSCULAR | Status: AC
  Administered 2020-05-18: 07:00:00 40 mg via INTRAVENOUS
  Filled 2020-05-18: qty 1

## 2020-05-18 MED ORDER — DIPHENHYDRAMINE HCL 12.5 MG/5ML PO ELIX
12.5000 mg | ORAL_SOLUTION | Freq: Four times a day (QID) | ORAL | Status: DC
  Administered 2020-05-18 – 2020-05-19 (×4): 12.5 mg via ORAL
  Filled 2020-05-18 (×6): qty 5

## 2020-05-18 MED ORDER — MAGNESIUM SULFATE 2 GM/50ML IV SOLN
2.0000 g | Freq: Once | INTRAVENOUS | Status: AC
  Administered 2020-05-18: 2 g via INTRAVENOUS
  Filled 2020-05-18: qty 50

## 2020-05-18 NOTE — TOC Progression Note (Addendum)
Transition of Care [facility]) - Progression Note    Patient Details  Name: Peter Page MRN: [id number] Date of Birth: 11-25-1960  [facility] CM/SW Contact  Liliana Cline, LCSW Phone Number: 05/18/2020, 1:40 PM  Clinical Narrative:   PT changed recommendation to Outpatient PT. CSW spoke with patient. Patient says he still does not want HH or SNF. He reported he "wouldn't want to bother his family for transportation" to and from Outpatient PT. Patient says he does have family who could transport him if needed. Patient declining Outpatient PT, HH, and SNF.    Expected Discharge Plan: Home/Self Care Barriers to Discharge: Continued Medical Work up  Expected Discharge Plan and Services Expected Discharge Plan: Home/Self Care       Living arrangements for the past 2 months: Single Family Home                                       Social Determinants of Health (SDOH) Interventions    Readmission Risk Interventions No flowsheet data found.

## 2020-05-18 NOTE — Progress Notes (Signed)
Neurology Progress Note  Patient ID: Peter Page is a 59 y.o. with PMHx of  has a past medical history of Asthma and COPD (chronic obstructive pulmonary disease) ([place]). 05/15/2020   Initially consulted for: Cerebellar stroke  Major interval events:  - CT head overnight for worsening HA - Received benadryl 12.5 mg, ketorolac 30 mg, methylpred 40 mg at 7 AM - Scheduled reglan Q6hr - IV mag 2 g at 10:30 AM 11/16 and 7 AM today - Oxycodone 5 mg q6hr PRN (one dose 11/15, three doses 11/16, one dose at 2 AM today) - Tylenol 650 mg at 21:34 and 06:11   Subjective: - Headache 0/10 now,   Exam: Vitals:   05/17/20 2357 05/18/20 0700  BP: (!) 124/98 (!) 151/93  Pulse: 63 60  Resp: 18 18  Temp: 97.8 F (36.6 C) 97.8 F (36.6 C)  SpO2: 96% 97%   Gen: Sitting up in chair, comfortable  Resp: non-labored breathing, no grossly audible wheezing Cardiac: Perfusing extremities well  Abd: soft, nt  Neuro: MS: Alertness and is oriented to person/place/situation CN: External ocular movements more smooth than previously, no nystagmus, face symmetric, tongue midline Motor: Moving all 4 extremities equally spontaneously and to command Coordination: Mild overshooting on mirror testing, finger to nose minimally dysmetric however  Gait: not formally tested, patient reports continuing to improve  Pertinent Labs: No results found for: RPR  Lab Results  Component Value Date   CHOL 148 05/16/2020   HDL 40 (L) 05/16/2020   LDLCALC 84 05/16/2020   TRIG 118 05/16/2020   CHOLHDL 3.7 05/16/2020   Lab Results  Component Value Date   HGBA1C 5.6 05/16/2020   Lab Results  Component Value Date   TSH 0.872 05/17/2020    Lab Results  Component Value Date   ESRSEDRATE 9 05/17/2020   Na 137  ECHO with mild dilatation of the aortic root, measuring 41 mm; otherwise normal  Impression: This is a 59 year old gentleman with limited prior healthcare with vascular risk factors of smoking  presenting with a posterior fossa stroke likely atheroembolic from severe left subclavian stenosis, cannot exclude cardioembolic source fully, therefore recommend 30-day event monitor on discharge. Recommend additional day of monitoring given worsening headache and posterior fossa stroke  Recommendations: # Cerebellar stroke likely secondary to left subclavian stenosis  - Stroke labs TSH  0.872, RPR pending - HgbA1c 5.6%, fasting lipid panel LDL 84, ESR 9  - Frequent neuro checks - Echocardiogram with mild dilatation of the aortic root, measuring 41 mm; otherwise normal - Continue lifelong : Aspirin - 81 mg daily - Plavix s/p 300 mg load with 75 mg daily for a minimum of a 90 day course (likely longer after stent placement) - Agree with Atorvastatin 40 mg given LDL just above goal of < 70  - Risk factor modification - Telemetry monitoring; 30 day event monitor on discharge - Blood pressure goal: gradual reduction by  ~10% daily until normotensive unless more liberal goal per vascular surgery  - PT consult, OT consult, Speech consult - Appreciate vascular surgery consult and plans for future intervention  - Needs referral for outpatient neurology follow-up - Please monitor inpatient until past peak swelling period (3-5 days from event, so could consider discharge Friday afternoon if patient remains stable)  #  Headaches, likely secondary to posterior fossa stroke - tylenol 650 mg q6hr scheduled - benadryl 12.5 mg q6hr scheduled  - reduced oxycodone to 2.5 mg and plan to stop - re-eval patient for  agitation or severely worsening HA or change in neurological status -- repeat STAT HCT in this situation and emergently page neurology   [place] [phone]

## 2020-05-18 NOTE — Progress Notes (Signed)
Physical Therapy Treatment Patient Details Name: Peter Page MRN: [id number] DOB: [birth year] Today's Date: 05/18/2020    History of Present Illness Peter Page is a 59yoM who comes [facility] ED after fall, found on porch by family, severe dizziness. 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 Pt also noted to have Left subclavian stenosis, MD asking for BPs on RUE only.    PT Comments    Pt in bed upon entry, easily awakened by voice, pleasant, agreeable to participate. Pt still moving generally well, low effort, high velocity bed mobility and transfers, however continues to be impulsive, requires multiple cues to wait until Thereasa Parkin is ready, not always successful as following cues. Trial of AMB with RW this date to establish capacity to be safe at modI, pt much less obvious ataxia when using RW, no need for minA to recovery LOB as in previous sessions. Pt tolerates increased AMB to >577ft, no adverse effects this date, no dizziness, no headache. Pt took part in 4-square stepping for coordination training and balance in hallway, struggles in a big way to quell impulsivity, follow simple commands, or learn the simple pattern of the exercise. This date impulsivity and cognitive impairment are more limiting to safety at DC than actual ataxia. Pt is appropriate for OPPT (or HHPT if transportation is a limitation), but will still need supervision with mobility at DC for safety.    Follow Up Recommendations  Supervision for mobility/OOB;Outpatient PT;Other (comment) (AMB at supervision level with RW; impulsivity is biggest safety factor)     Equipment Recommendations  Rolling walker with 5" wheels    Recommendations for Other Services       Precautions / Restrictions Precautions Precautions: Fall;Other (comment) Precaution Comments: No LUE pressures due to subclavian stenosis Restrictions Weight Bearing Restrictions: No    Mobility  Bed Mobility Overal bed mobility: Needs  Assistance Bed Mobility: Sit to Supine;Supine to Sit     Supine to sit: Supervision Sit to supine: Supervision   General bed mobility comments: limited awareness of lines/leads; impulsive; otherwise moves well without obvious weakness  Transfers Overall transfer level: Needs assistance Equipment used: None Transfers: Sit to/from Stand Sit to Stand: Supervision         General transfer comment: impulsive, stands several times without warning despite asking pt to remain seated.  Ambulation/Gait   Gait Distance (Feet): 520 Feet Assistive device: Rolling walker (2 wheeled) Gait Pattern/deviations: WFL(Within Functional Limits)     General Gait Details: mild gait ataxia, more safe with RW; needs fewer cues to maintain velocity within a controllable range.   Stairs             Wheelchair Mobility    Modified Rankin (Stroke Patients Only)       Balance                                            Cognition Arousal/Alertness: Awake/alert Behavior During Therapy: WFL for tasks assessed/performed;Impulsive Overall Cognitive Status: No family/caregiver present to determine baseline cognitive functioning                                        Exercises Other Exercises Other Exercises: 4-square stepping training: struggles with learning this simple pattern, struggles with accuracy with cues more than actualy balance  impairment.    General Comments        Pertinent Vitals/Pain Pain Assessment: No/denies pain    Home Living                      Prior Function            PT Goals (current goals can now be found in the care plan section) Acute Rehab PT Goals Patient Stated Goal: to return home PT Goal Formulation: With patient Time For Goal Achievement: 05/30/20 Potential to Achieve Goals: Good Progress towards PT goals: Progressing toward goals    Frequency    Min 2X/week      PT Plan Current plan  remains appropriate    Co-evaluation              AM-PAC PT "6 Clicks" Mobility   Outcome Measure  Help needed turning from your back to your side while in a flat bed without using bedrails?: A Little Help needed moving from lying on your back to sitting on the side of a flat bed without using bedrails?: A Little Help needed moving to and from a bed to a chair (including a wheelchair)?: A Little Help needed standing up from a chair using your arms (e.g., wheelchair or bedside chair)?: A Little Help needed to walk in hospital room?: A Little Help needed climbing 3-5 steps with a railing? : A Little 6 Click Score: 18    End of Session Equipment Utilized During Treatment: Gait belt Activity Tolerance: Patient tolerated treatment well;No increased pain Patient left: with call bell/phone within reach;in chair;with chair alarm set Nurse Communication: Mobility status PT Visit Diagnosis: Unsteadiness on feet (R26.81);Difficulty in walking, not elsewhere classified (R26.2);Ataxic gait (R26.0);Other symptoms and signs involving the nervous system (R29.898)     Time: 2683-4196 PT Time Calculation (min) (ACUTE ONLY): 23 min  Charges:  $Neuromuscular Re-education: 23-37 mins                     10:33 AM, 05/18/20 Rosamaria Lints, PT, DPT Physical Therapist - [facility]  [phone] (ASCOM)     Meko Bellanger C 05/18/2020, 10:29 AM

## 2020-05-18 NOTE — Plan of Care (Signed)
  Problem: Education: Goal: Knowledge of General Education information will improve Description: Including pain rating scale, medication(s)/side effects and non-pharmacologic comfort measures Outcome: Progressing   Problem: Clinical Measurements: Goal: Will remain free from infection Outcome: Progressing Goal: Diagnostic test results will improve Outcome: Progressing Goal: Respiratory complications will improve Outcome: Progressing   Problem: Activity: Goal: Risk for activity intolerance will decrease Outcome: Progressing   Problem: Nutrition: Goal: Adequate nutrition will be maintained Outcome: Progressing   Problem: Coping: Goal: Level of anxiety will decrease Outcome: Progressing   Problem: Elimination: Goal: Will not experience complications related to bowel motility Outcome: Progressing   Problem: Pain Managment: Goal: General experience of comfort will improve Outcome: Progressing   Problem: Safety: Goal: Ability to remain free from injury will improve Outcome: Progressing   Problem: Skin Integrity: Goal: Risk for impaired skin integrity will decrease Outcome: Progressing   Problem: Education: Goal: Knowledge of disease or condition will improve Outcome: Progressing Goal: Knowledge of secondary prevention will improve Outcome: Progressing Goal: Knowledge of patient specific risk factors addressed and post discharge goals established will improve Outcome: Progressing

## 2020-05-18 NOTE — Progress Notes (Addendum)
Pt is A&O, continues to experience acute headache's. He has been medicated with tylenol on 11/16 at 9.34pm,and on 11/17 at 6.11am; he also received oxy at 2.18am. Pt states that tylenol is not as effective and during an encounter with him this morning, states the pain is everywhere on his head, not just the posterior portion as earlier stated during pain assessments.   NP notified, CT of head with contrast ordered  Toradol, benadryl, methylprednisone and magnesium sulphate ordered and administered.  Report endorsed to incoming nurse.

## 2020-05-18 NOTE — Progress Notes (Signed)
PROGRESS NOTE    Peter Page  DJM:[id number] DOB: 06/23/1961 DOA: 05/15/2020 PCP: Patient, No Pcp Per   Brief Narrative: Peter Page is a 59 y.o. male with medical history significant for tobacco abuse until 2 years ago who was brought in by EMS after syncopal episode witnessed by his family.  Following the episode he complained of dizziness on standing and nausea.  Found on imaging to have bilateral cerebellar infarcts.  Neurology consulted.  Hospital course has demonstrated improvement.  Patient unable to ambulate with minimal assistance.  Physical therapy recommending outpatient physical therapy versus home health.  Patient continues to experience headaches.  Communicated with neurology.  Minimize narcotics and NSAID administration while on DAPT.   Assessment & Plan:   Active Problems:   Cerebellar stroke, acute (HCC)   History of tobacco use   Acute CVA (cerebrovascular accident) (HCC)   Hypotension   Dizziness   Chronic obstructive pulmonary disease (HCC)   Subclavian artery stenosis, left (HCC)   Nausea   Nonintractable headache   Essential hypertension   Hypokalemia   Malnutrition of moderate degree  Acute bilateral cerebellar strokes Patient was not a TPA candidate Neurology following since admission Physical therapy initially recommending rehab.  Now adjusting recommendations to outpatient versus home health TOC aware Plan: Continue DAPT aspirin and Plavix Continue statin  Intractable headache Repeat CT demonstrating evolution of cerebellar infarct Communicated with neurology Plan to monitor 1 additional day in house Avoid NSAIDs while on DAPT Minimize narcotics Monitor exam  Essential hypertension Allow some permissive hypertension Blood pressures in right arm Start amlodipine 5 mg a day Slow correction of blood pressures  Left subclavian stenosis Vascular surgery consulted during this admission No acute management Outpatient  follow-up Patient will follow up with Dr. Wyn Page 2 weeks post discharge  COPD Suspected from chest imaging findings Patient on room air without evidence of exacerbation Outpatient follow-up   DVT prophylaxis: Lovenox Code Status: Full Family Communication:  Disposition Plan: Status is: Inpatient  Remains inpatient appropriate because:Ongoing active pain requiring inpatient pain management and Inpatient level of care appropriate due to severity of illness   Dispo: The patient is from: Home              Anticipated d/c is to: Home              Anticipated d/c date is: 1 day              Patient currently is not medically stable to d/c.  Given evolving infarct seen on CT and persistent headache we will plan to monitor in house for today with tentative plan to discharge home on May 19, 2020.       Consultants:   Neurology  Procedures:   None  Antimicrobials:   None   Subjective: Seen and examined.  Endorses severe headache yesterday however resolved on my evaluation.  Did have some recurrent severe headache treated with ketorolac, magnesium, oxycodone with good result.  Objective: Vitals:   05/17/20 2043 05/17/20 2357 05/18/20 0700 05/18/20 1000  BP: (!) 151/104 (!) 124/98 (!) 151/93 (!) 159/109  Pulse: 78 63 60   Resp: 18 18 18    Temp: 97.9 F (36.6 C) 97.8 F (36.6 C) 97.8 F (36.6 C)   TempSrc:      SpO2: 97% 96% 97%   Weight:      Height:        Intake/Output Summary (Last 24 hours) at 05/18/2020 1043 Last data filed at 05/18/2020  0630 Gross per 24 hour  Intake 2047.6 ml  Output 2125 ml  Net -77.4 ml   Filed Weights   05/15/20 1508  Weight: 58 kg    Examination:  General exam: Appears calm and comfortable  Respiratory system: Clear to auscultation. Respiratory effort normal. Cardiovascular system: S1 & S2 heard, RRR. No JVD, murmurs, rubs, gallops or clicks. No pedal edema. Gastrointestinal system: Abdomen is nondistended, soft and  nontender. No organomegaly or masses felt. Normal bowel sounds heard. Central nervous system: Alert and oriented. No focal neurological deficits. Extremities: Symmetric 5 x 5 power. Skin: Black eschar on tip of nose Psychiatry: Judgement and insight appear normal. Mood & affect appropriate.     Data Reviewed: I have personally reviewed following labs and imaging studies  CBC: Recent Labs  Lab 05/15/20 1609 05/16/20 0235 05/17/20 0725  WBC 8.6 7.0 6.4  NEUTROABS 6.2  --  4.1  HGB 14.0 12.6* 14.3  HCT 41.3 38.4* 41.8  MCV 92.4 93.9 91.7  PLT 304 242 256   Basic Metabolic Panel: Recent Labs  Lab 05/15/20 1609 05/16/20 0235 05/17/20 0725 05/18/20 0419  NA 140  --  137 137  K 3.7  --  3.4* 4.0  CL 107  --  105 105  CO2 21*  --  21* 23  GLUCOSE 145*  --  95 101*  BUN 12  --  8 10  CREATININE 0.91 0.98 0.90 0.83  CALCIUM 8.2*  --  8.3* 8.5*  MG  --   --   --  2.2   GFR: Estimated Creatinine Clearance: 77.6 mL/min (by C-G formula based on SCr of 0.83 mg/dL). Liver Function Tests: Recent Labs  Lab 05/15/20 1609 05/17/20 0725  AST 31 24  ALT 20 17  ALKPHOS 72  --   BILITOT 0.8 0.9  PROT 6.6  --   ALBUMIN 3.6  --    Recent Labs  Lab 05/15/20 1609  LIPASE 21   No results for input(s): AMMONIA in the last 168 hours. Coagulation Profile: Recent Labs  Lab 05/16/20 0058 05/17/20 0725  INR 1.0 1.0   Cardiac Enzymes: Recent Labs  Lab 05/17/20 0725  CKTOTAL 287   BNP (last 3 results) No results for input(s): PROBNP in the last 8760 hours. HbA1C: Recent Labs    05/16/20 0604  HGBA1C 5.6   CBG: No results for input(s): GLUCAP in the last 168 hours. Lipid Profile: Recent Labs    05/16/20 0604  CHOL 148  HDL 40*  LDLCALC 84  TRIG 409  CHOLHDL 3.7   Thyroid Function Tests: Recent Labs    05/17/20 1439  TSH 0.872   Anemia Panel: Recent Labs    05/17/20 0726  RETICCTPCT 1.2   Sepsis Labs: No results for input(s): PROCALCITON,  LATICACIDVEN in the last 168 hours.  Recent Results (from the past 240 hour(s))  Respiratory Panel by RT PCR (Flu A&B, Covid) - Nasopharyngeal Swab     Status: None   Collection Time: 05/15/20  9:02 PM   Specimen: Nasopharyngeal Swab  Result Value Ref Range Status   SARS Coronavirus 2 by RT PCR NEGATIVE NEGATIVE Final    Comment: (NOTE) SARS-CoV-2 target nucleic acids are NOT DETECTED.  The SARS-CoV-2 RNA is generally detectable in upper respiratoy specimens during the acute phase of infection. The lowest concentration of SARS-CoV-2 viral copies this assay can detect is 131 copies/mL. A negative result does not preclude SARS-Cov-2 infection and should not be used as the sole basis  for treatment or other patient management decisions. A negative result may occur with  improper specimen collection/handling, submission of specimen other than nasopharyngeal swab, presence of viral mutation(s) within the areas targeted by this assay, and inadequate number of viral copies (<131 copies/mL). A negative result must be combined with clinical observations, patient history, and epidemiological information. The expected result is Negative.  Fact Sheet for Patients:  https://www.moore.com/  Fact Sheet for Healthcare Providers:  https://www.young.biz/  This test is no t yet approved or cleared by the [place] FDA and  has been authorized for detection and/or diagnosis of SARS-CoV-2 by FDA under an Emergency Use Authorization (EUA). This EUA will remain  in effect (meaning this test can be used) for the duration of the COVID-19 declaration under Section 564(b)(1) of the Act, 21 U.S.C. section 360bbb-3(b)(1), unless the authorization is terminated or revoked sooner.     Influenza A by PCR NEGATIVE NEGATIVE Final   Influenza B by PCR NEGATIVE NEGATIVE Final    Comment: (NOTE) The Xpert Xpress SARS-CoV-2/FLU/RSV assay is intended as an aid in  the  diagnosis of influenza from Nasopharyngeal swab specimens and  should not be used as a sole basis for treatment. Nasal washings and  aspirates are unacceptable for Xpert Xpress SARS-CoV-2/FLU/RSV  testing.  Fact Sheet for Patients: https://www.moore.com/  Fact Sheet for Healthcare Providers: https://www.young.biz/  This test is not yet approved or cleared by the [place] FDA and  has been authorized for detection and/or diagnosis of SARS-CoV-2 by  FDA under an Emergency Use Authorization (EUA). This EUA will remain  in effect (meaning this test can be used) for the duration of the  Covid-19 declaration under Section 564(b)(1) of the Act, 21  U.S.C. section 360bbb-3(b)(1), unless the authorization is  terminated or revoked. Performed at [facility], [address] [place], [place] [address]          Radiology Studies: CT Code Stroke CTA Head W/WO contrast  Result Date: 05/16/2020 CLINICAL DATA:  Dizziness and vertigo. 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 EXAM: CT ANGIOGRAPHY HEAD AND NECK TECHNIQUE: Multidetector CT imaging of the head and neck was performed using the standard protocol during bolus administration of intravenous contrast. Multiplanar CT image reconstructions and MIPs were obtained to evaluate the vascular anatomy. Carotid stenosis measurements (when applicable) are obtained utilizing NASCET criteria, using the distal internal carotid diameter as the denominator. CONTRAST:  75mL OMNIPAQUE IOHEXOL 350 MG/ML SOLN COMPARISON:  05/15/2020 FINDINGS: CT HEAD FINDINGS Brain: Small areas of low-density visible within the cerebellum, particularly in the region of the vermis. No evidence of mass effect or hemorrhage. Cerebral hemispheres appear normal by CT. No evidence of stroke, mass, hemorrhage, hydrocephalus or extra-axial collection. Vascular: There is atherosclerotic calcification of the major vessels  at the base of the brain. Skull: Negative Sinuses: Clear Orbits: Normal Review of the MIP images confirms the above findings CTA NECK FINDINGS Aortic arch: Aortic atherosclerotic calcification. Branching pattern is normal without origin stenosis. Right carotid system: Common carotid artery widely patent to the bifurcation. Soft and calcified plaque at the ICA bulb but without stenosis when compared to the more distal cervical ICA diameter. Left carotid system: Common carotid artery widely patent to the bifurcation. No soft or calcified plaque at the bifurcation or bulb. Cervical ICA widely patent. Vertebral arteries: Right subclavian artery is normal. Right vertebral origin is widely patent. 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 This appears to be due  to soft plaque. Left vertebral artery origin is widely patent. Left vertebral artery is normal through the cervical region to the foramen magnum. Skeleton: Degenerative cervical spondylosis and facet osteoarthritis. Other neck: No soft tissue mass or lymphadenopathy. Upper chest: Severe emphysema in the upper lungs. Review of the MIP images confirms the above findings CTA HEAD FINDINGS Anterior circulation: Both internal carotid arteries patent through the skull base and siphon regions. Ordinary siphon atherosclerotic calcification but no stenosis greater than 30%. The anterior and middle cerebral vessels are patent without proximal stenosis, aneurysm or vascular malformation. No large or medium vessel occlusion. Posterior circulation: Right vertebral artery is a large vessel widely patent through the foramen magnum. There is calcified plaque affecting the right vertebral V4 segment but no stenosis. I think there is a very small right PICA. 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 No antegrade flow through the foramen magnum. No left PICA demonstrated. No  basilar stenosis. Small bilateral anterior inferior cerebellar arteries show flow. Superior cerebellar and posterior cerebral arteries are patent. Venous sinuses: Patent and normal. Anatomic variants: None significant. Review of the MIP images confirms the above findings IMPRESSION: 1. 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 2. No significant carotid bifurcation disease. 3. No intracranial anterior circulation large or medium vessel occlusion. 4. 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 No antegrade flow through the foramen magnum. Right vertebral artery widely patent. No basilar stenosis. Superior cerebellar and posterior cerebral arteries are patent. No left PICA flow demonstrated. 5. Severe emphysema. Aortic Atherosclerosis (ICD10-I70.0) and Emphysema (ICD10-J43.9). Electronically Signed   By: Paulina Fusi M.D.   On: 05/16/2020 11:03   CT HEAD WO CONTRAST  Result Date: 05/18/2020 CLINICAL DATA:  Headache. Dizziness. Bilateral cerebellar infarcts. EXAM: CT HEAD WITHOUT CONTRAST TECHNIQUE: Contiguous axial images were obtained from the base of the skull through the vertex without intravenous contrast. COMPARISON:  CT 05/16/2020.  MRI 05/15/2020 FINDINGS: Brain: Acute bilateral cerebellar infarcts appear more conspicuous by CT on today's study, particularly within the vermis. No evidence of hemorrhagic transformation. No extra-axial collection. No hydrocephalus. Vascular: Atherosclerotic calcifications involving the large vessels of the skull base. No unexpected hyperdense vessel. Skull: Normal. Negative for fracture or focal lesion. Sinuses/Orbits: No acute finding. Other: None. IMPRESSION: Continued evolution of acute bilateral cerebellar infarcts appear. No evidence of hemorrhagic transformation. Electronically Signed   By: Duanne Guess  D.O.   On: 05/18/2020 08:34   CT Code Stroke CTA Neck W/WO contrast  Result Date: 05/16/2020 CLINICAL DATA:  Dizziness and vertigo. 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 EXAM: CT ANGIOGRAPHY HEAD AND NECK TECHNIQUE: Multidetector CT imaging of the head and neck was performed using the standard protocol during bolus administration of intravenous contrast. Multiplanar CT image reconstructions and MIPs were obtained to evaluate the vascular anatomy. Carotid stenosis measurements (when applicable) are obtained utilizing NASCET criteria, using the distal internal carotid diameter as the denominator. CONTRAST:  75mL OMNIPAQUE IOHEXOL 350 MG/ML SOLN COMPARISON:  05/15/2020 FINDINGS: CT HEAD FINDINGS Brain: Small areas of low-density visible within the cerebellum, particularly in the region of the vermis. No evidence of mass effect or hemorrhage. Cerebral hemispheres appear normal by CT. No evidence of stroke, mass, hemorrhage, hydrocephalus or extra-axial collection. Vascular: There is atherosclerotic calcification of the major vessels at the base of the brain.  Skull: Negative Sinuses: Clear Orbits: Normal Review of the MIP images confirms the above findings CTA NECK FINDINGS Aortic arch: Aortic atherosclerotic calcification. Branching pattern is normal without origin stenosis. Right carotid system: Common carotid artery widely patent to the bifurcation. Soft and calcified plaque at the ICA bulb but without stenosis when compared to the more distal cervical ICA diameter. Left carotid system: Common carotid artery widely patent to the bifurcation. No soft or calcified plaque at the bifurcation or bulb. Cervical ICA widely patent. Vertebral arteries: Right subclavian artery is normal. Right vertebral origin is widely patent. 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 This appears to be due to soft plaque. Left vertebral artery origin is widely patent. Left  vertebral artery is normal through the cervical region to the foramen magnum. Skeleton: Degenerative cervical spondylosis and facet osteoarthritis. Other neck: No soft tissue mass or lymphadenopathy. Upper chest: Severe emphysema in the upper lungs. Review of the MIP images confirms the above findings CTA HEAD FINDINGS Anterior circulation: Both internal carotid arteries patent through the skull base and siphon regions. Ordinary siphon atherosclerotic calcification but no stenosis greater than 30%. The anterior and middle cerebral vessels are patent without proximal stenosis, aneurysm or vascular malformation. No large or medium vessel occlusion. Posterior circulation: Right vertebral artery is a large vessel widely patent through the foramen magnum. There is calcified plaque affecting the right vertebral V4 segment but no stenosis. I think there is a very small right PICA. 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 No antegrade flow through the foramen magnum. No left PICA demonstrated. No basilar stenosis. Small bilateral anterior inferior cerebellar arteries show flow. Superior cerebellar and posterior cerebral arteries are patent. Venous sinuses: Patent and normal. Anatomic variants: None significant. Review of the MIP images confirms the above findings IMPRESSION: 1. 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 2. No significant carotid bifurcation disease. 3. No intracranial anterior circulation large or medium vessel occlusion. 4. 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 No antegrade flow through the foramen magnum. Right vertebral artery widely patent. No basilar stenosis. Superior cerebellar and posterior cerebral arteries are patent. No left PICA flow demonstrated. 5. Severe  emphysema. Aortic Atherosclerosis (ICD10-I70.0) and Emphysema (ICD10-J43.9). Electronically Signed   By: Paulina Fusi M.D.   On: 05/16/2020 11:03   US Carotid Bilateral (at [facility] and [facility] only)  Result Date: 05/16/2020 CLINICAL DATA:  Stroke. EXAM: BILATERAL CAROTID DUPLEX ULTRASOUND TECHNIQUE: Wallace Cullens scale imaging, color Doppler and duplex ultrasound were performed of bilateral carotid and vertebral arteries in the neck. COMPARISON:  CTA neck 05/16/2020 FINDINGS: Criteria: Quantification of carotid stenosis is based on velocity parameters that correlate the residual internal carotid diameter with NASCET-based stenosis levels, using the diameter of the distal internal carotid lumen as the denominator for stenosis measurement. The following velocity measurements were obtained: RIGHT ICA: 87/29 cm/sec CCA: 69/18 cm/sec SYSTOLIC ICA/CCA RATIO:  1.3 ECA: 89 cm/sec LEFT ICA: 111/38 cm/sec CCA: 84/21 cm/sec SYSTOLIC ICA/CCA RATIO:  1.3 ECA: 106 cm/sec RIGHT CAROTID ARTERY: Small amount of plaque in the distal common carotid artery and carotid bulb. External carotid artery is patent with normal waveform. Small amount of plaque in the proximal internal carotid artery. Normal waveforms and velocities in the internal carotid artery. RIGHT VERTEBRAL ARTERY: Antegrade flow and  normal waveform in the right vertebral artery. LEFT CAROTID ARTERY: Intimal thickening at the left carotid bulb. External carotid artery is patent with normal waveform. Normal waveforms and velocities in the internal carotid artery. LEFT VERTEBRAL ARTERY: Antegrade flow and normal waveform in the left vertebral artery. IMPRESSION: 1. Small amount of atherosclerotic plaque involving the carotid arteries, most prominent at the right carotid bulb region. Estimated degree of stenosis in the internal carotid arteries is less than 50% bilaterally. 2. Patent vertebral arteries with antegrade flow. Electronically Signed   By: Richarda OverlieAdam  Henn M.D.   On: 05/16/2020  12:04   ECHOCARDIOGRAM COMPLETE  Result Date: 05/17/2020    ECHOCARDIOGRAM REPORT   Patient Name:   Jonavin RAY Zagal Date of Exam: 05/17/2020 Medical Rec #:  [id number]         Height:       64.0 in Accession #:    [id number]        Weight:       127.9 lb Date of Birth:  October 09, 1960         BSA:          1.618 m Patient Age:    59 years          BP:           155/89 mmHg Patient Gender: M                 HR:           46 bpm. Exam Location:  [facility] Procedure: 2D Echo, Cardiac Doppler and Color Doppler Indications:    Stroke 434.91  History:        Patient has no prior history of Echocardiogram examinations.                 COPD.  Sonographer:    Cristela BlueJerry Hege RDCS (AE) Referring Phys: [id number] Andris BaumannHAZEL V DUNCAN Diagnosing      Yvonne Kendallhristopher End MD Phys:  Sonographer Comments: Global longitudinal strain was attempted. IMPRESSIONS  1. Left ventricular ejection fraction, by estimation, is 60 to 65%. The left ventricle has normal function. Left ventricular endocardial border not optimally defined to evaluate regional wall motion. Left ventricular diastolic parameters were normal.  2. Right ventricular systolic function is normal. The right ventricular size is normal. There is normal pulmonary artery systolic pressure.  3. The mitral valve is normal in structure. Trivial mitral valve regurgitation. No evidence of mitral stenosis.  4. The aortic valve was not well visualized. Aortic valve regurgitation is not visualized. No aortic stenosis is present.  5. Aortic dilatation noted. There is mild dilatation of the aortic root, measuring 41 mm.  6. The inferior vena cava is normal in size with greater than 50% respiratory variability, suggesting right atrial pressure of 3 mmHg. FINDINGS  Left Ventricle: Left ventricular ejection fraction, by estimation, is 60 to 65%. The left ventricle has normal function. Left ventricular endocardial border not optimally defined to evaluate regional wall motion. The left ventricular internal cavity  size was normal in size. There is borderline left ventricular hypertrophy. Left ventricular diastolic parameters were normal. Right Ventricle: The right ventricular size is normal. No increase in right ventricular wall thickness. Right ventricular systolic function is normal. There is normal pulmonary artery systolic pressure. The tricuspid regurgitant velocity is 2.09 m/s, and  with an assumed right atrial pressure of 3 mmHg, the estimated right ventricular systolic pressure is 20.5 mmHg. Left Atrium: Left atrial size was normal in size. Right Atrium: Right  atrial size was normal in size. Pericardium: There is no evidence of pericardial effusion. Mitral Valve: The mitral valve is normal in structure. Trivial mitral valve regurgitation. No evidence of mitral valve stenosis. Tricuspid Valve: The tricuspid valve is normal in structure. Tricuspid valve regurgitation is trivial. Aortic Valve: The aortic valve was not well visualized. Aortic valve regurgitation is not visualized. No aortic stenosis is present. Aortic valve mean gradient measures 3.0 mmHg. Aortic valve peak gradient measures 5.7 mmHg. Aortic valve area, by VTI measures 3.00 cm. Pulmonic Valve: The pulmonic valve was not well visualized. Pulmonic valve regurgitation is not visualized. No evidence of pulmonic stenosis. Aorta: Aortic dilatation noted. There is mild dilatation of the aortic root, measuring 41 mm. Pulmonary Artery: The pulmonary artery is not well seen. Venous: The inferior vena cava is normal in size with greater than 50% respiratory variability, suggesting right atrial pressure of 3 mmHg. IAS/Shunts: The interatrial septum was not well visualized.  LEFT VENTRICLE PLAX 2D LVIDd:         4.03 cm  Diastology LVIDs:         2.03 cm  LV e' medial:    7.72 cm/s LV PW:         1.16 cm  LV E/e' medial:  10.9 LV IVS:        0.84 cm  LV e' lateral:   10.30 cm/s LVOT diam:     2.20 cm  LV E/e' lateral: 8.2 LV SV:         68 LV SV Index:   42 LVOT  Area:     3.80 cm                          3D Volume EF:                         3D EF:        60 %                         LV EDV:       119 ml                         LV ESV:       48 ml                         LV SV:        72 ml RIGHT VENTRICLE RV Basal diam:  2.78 cm RV S prime:     13.80 cm/s TAPSE (M-mode): 2.0 cm LEFT ATRIUM             Index       RIGHT ATRIUM           Index LA diam:        3.80 cm 2.35 cm/m  RA Area:     15.50 cm LA Vol (A2C):   39.5 ml 24.42 ml/m RA Volume:   42.10 ml  26.03 ml/m LA Vol (A4C):   25.9 ml 16.01 ml/m LA Biplane Vol: 33.5 ml 20.71 ml/m  AORTIC VALVE                   PULMONIC VALVE AV Area (Vmax):    2.61 cm    PV Vmax:        0.79 m/s AV  Area (Vmean):   2.83 cm    PV Peak grad:   2.5 mmHg AV Area (VTI):     3.00 cm    RVOT Peak grad: 3 mmHg AV Vmax:           119.00 cm/s AV Vmean:          70.900 cm/s AV VTI:            0.228 m AV Peak Grad:      5.7 mmHg AV Mean Grad:      3.0 mmHg LVOT Vmax:         81.60 cm/s LVOT Vmean:        52.800 cm/s LVOT VTI:          0.180 m LVOT/AV VTI ratio: 0.79  AORTA Ao Root diam: 4.00 cm MITRAL VALVE               TRICUSPID VALVE MV Area (PHT): 4.19 cm    TR Peak grad:   17.5 mmHg MV Decel Time: 181 msec    TR Vmax:        209.00 cm/s MV E velocity: 84.00 cm/s MV A velocity: 59.60 cm/s  SHUNTS MV E/A ratio:  1.41        Systemic VTI:  0.18 m                            Systemic Diam: 2.20 cm Yvonne Kendall MD Electronically signed by Yvonne Kendall MD Signature Date/Time: 05/17/2020/2:10:37 PM    Final         Scheduled Meds: . acetaminophen  1,000 mg Oral Q6H  . aspirin EC  81 mg Oral Daily  . atorvastatin  40 mg Oral Daily  . clopidogrel  75 mg Oral Daily  . diphenhydrAMINE  12.5 mg Oral Q6H  . enoxaparin (LOVENOX) injection  40 mg Subcutaneous Q24H  . feeding supplement  237 mL Oral BID BM  . ketorolac  15 mg Intravenous Once  . methylPREDNISolone (SOLU-MEDROL) injection  40 mg Intravenous Once  .  metoCLOPramide (REGLAN) injection  5 mg Intravenous Q6H  . neomycin-bacitracin-polymyxin   Topical BID   Continuous Infusions: . magnesium sulfate bolus IVPB       LOS: 2 days    Time spent: 25 minutes    Tresa Moore, MD [facility] Pager [phone]  If 7PM-7AM, please contact night-coverage 05/18/2020, 10:43 AM

## 2020-05-19 DIAGNOSIS — I639 Cerebral infarction, unspecified: Secondary | ICD-10-CM | POA: Diagnosis not present

## 2020-05-19 DIAGNOSIS — R519 Headache, unspecified: Secondary | ICD-10-CM | POA: Diagnosis not present

## 2020-05-19 MED ORDER — AMLODIPINE BESYLATE 5 MG PO TABS: 5.0000 mg | ORAL_TABLET | Freq: Every day | ORAL | 0 refills | Status: DC

## 2020-05-19 MED ORDER — CLOPIDOGREL BISULFATE 75 MG PO TABS: 75.0000 mg | ORAL_TABLET | Freq: Every day | ORAL | 2 refills | Status: AC

## 2020-05-19 MED ORDER — DIPHENHYDRAMINE HCL 12.5 MG/5ML PO ELIX: 12.5000 mg | ORAL_SOLUTION | Freq: Four times a day (QID) | ORAL | 0 refills | Status: DC

## 2020-05-19 MED ORDER — ATORVASTATIN CALCIUM 40 MG PO TABS: 40.0000 mg | ORAL_TABLET | Freq: Every day | ORAL | 0 refills | Status: DC

## 2020-05-19 MED ORDER — ASPIRIN 81 MG PO TBEC: 81.0000 mg | DELAYED_RELEASE_TABLET | Freq: Every day | ORAL | 11 refills | Status: AC

## 2020-05-19 MED ORDER — ACETAMINOPHEN 325 MG PO TABS: 650.0000 mg | ORAL_TABLET | Freq: Four times a day (QID) | ORAL | 0 refills | Status: AC

## 2020-05-19 NOTE — Progress Notes (Signed)
Neurology Progress Note  Patient ID: Peter Page is a 59 y.o. with PMHx of  has a past medical history of Asthma and COPD (chronic obstructive pulmonary disease) ([place]). 05/15/2020   Initially consulted for: Cerebellar stroke  Major interval events:  - None, has been receiving benadryl and tylenol cocktails for HA, one dose of oxy overnight 2.5 mg  Subjective: - Headache resolved  - Feels gait steady - Eager to go home  Exam: Vitals:   05/19/20 0538 05/19/20 0830  BP: (!) 146/112 103/77  Pulse: 72 66  Resp: 17 16  Temp: (!) 97.5 F (36.4 C) 98.4 F (36.9 C)  SpO2: 99% 98%   Gen: Sitting up in chair, comfortable  Resp: non-labored breathing, no grossly audible wheezing Cardiac: Perfusing extremities well  Abd: soft, nt  Neuro: MS: Alertness and is oriented to person/place/situation CN: External ocular movements more smooth than previously, no nystagmus, face symmetric, tongue midline Motor: Moving all 4 extremities equally spontaneously and to command Coordination: Mild overshooting on mirror testing, finger to nose minimally dysmetric however  Gait: not formally tested, patient reports continuing to improve  Pertinent Labs: RPR negative  Lab Results  Component Value Date   CHOL 148 05/16/2020   HDL 40 (L) 05/16/2020   LDLCALC 84 05/16/2020   TRIG 118 05/16/2020   CHOLHDL 3.7 05/16/2020   Lab Results  Component Value Date   HGBA1C 5.6 05/16/2020   Lab Results  Component Value Date   TSH 0.872 05/17/2020    Lab Results  Component Value Date   ESRSEDRATE 9 05/17/2020   Na 137  ECHO with mild dilatation of the aortic root, measuring 41 mm; otherwise normal  Impression: This is a 59 year old gentleman with limited prior healthcare with vascular risk factors of smoking presenting with a 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 Recommend additional day of monitoring given worsening headache and posterior fossa stroke  Recommendations: # Cerebellar stroke likely secondary to left subclavian stenosis  - Stroke labs TSH  0.872, RPR negative - HgbA1c 5.6%, fasting lipid panel LDL 84, ESR 9  - Frequent neuro checks - Echocardiogram with mild dilatation of the aortic root, measuring 41 mm; otherwise normal - Continue lifelong : Aspirin - 81 mg daily - Plavix s/p 300 mg load with 75 mg daily for a minimum of a 90 day course (likely longer after stent placement) - Agree with Atorvastatin 40 mg given LDL just above goal of < 70  - Risk factor modification - Telemetry monitoring; 30 day event monitor on discharge - Blood pressure goal: gradual reduction by  ~10% daily until normotensive unless more liberal goal per vascular surgery  - PT consult, OT consult, Speech consult - Appreciate vascular surgery consult and plans for future intervention  - Needs referral for outpatient neurology follow-up - Please monitor inpatient until past peak swelling period (3-5 days from event, so could consider discharge Friday afternoon if patient remains stable)  #  Headaches, likely secondary to posterior fossa stroke - tylenol 650 mg q6hr scheduled, patient instructed to continue for 2 more days, then limit to 3-4 doses / week - benadryl 12.5 mg q6hr scheduled, then limit to 3-4 doses/week  Lesleigh Noe MD-PhD [facility] [phone]

## 2020-05-19 NOTE — Progress Notes (Signed)
Physical Therapy Treatment Patient Details Name: Peter Page MRN: [id number] DOB: [birth year] Today's Date: 05/19/2020    History of Present Illness Peter Page is a 59yoM who comes to [facility] ED after fall, found on porch by family, severe dizziness. 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 Pt also noted to have Left subclavian stenosis, MD asking for BPs on RUE only.    PT Comments    Pt was sitting in bed upon arriving. Agrees to session and is cooperative throughout. Has poor insight of deficits and is slightly impulsive.  Easily able to exit bed without assistance. Stand and ambulate without AD. Also easily able to ascend/descend FOS with +1 railing with supervision only. Discussed with pt, recommendation for continued skilled PT to address balance deficits however pt states he does not want to participate in PT after DC. Recommending OP PT to improve safe functional mobility. He was in bed at conclusion of session with call bell in reach and RN aware of his abilities.   Follow Up Recommendations  Outpatient PT;Other (comment) (pt currently refusing all PT after DC)     Equipment Recommendations  Other (comment);Rolling walker with 5" wheels (will benefit from RW for safety however pt unwilling to use)    Recommendations for Other Services       Precautions / Restrictions Precautions Precautions: Fall;Other (comment) Precaution Comments: No LUE pressures due to subclavian stenosis Restrictions Weight Bearing Restrictions: No    Mobility  Bed Mobility Overal bed mobility: Modified Independent        General bed mobility comments: pt did not require any physical assistance to exit/re-enter bed  Transfers Overall transfer level: Modified independent Equipment used: None Transfers: Sit to/from Stand Sit to Stand: Modified independent (Device/Increase time)     Ambulation/Gait Ambulation/Gait assistance: Supervision Gait Distance (Feet): 250  Feet Assistive device: None Gait Pattern/deviations: Staggering left;Staggering right;Step-through pattern Gait velocity: decreased   General Gait Details: pt does present with balance deficits with several occasions of unsteadiness however no therapist intervention required. able to self correct   Stairs Stairs: Yes Stairs assistance: Supervision Stair Management: One rail Left Number of Stairs: 9 General stair comments: pt easily able to ascend/descend FOS with +1 rail. pt reports only havig 4 stair to enter home.        Balance Overall balance assessment: Needs assistance Sitting-balance support: Feet supported Sitting balance-Leahy Scale: Normal     Standing balance support: During functional activity;No upper extremity supported Standing balance-Leahy Scale: Poor Standing balance comment: pt is at high fall risk 2/2 to poor safety awareness and poor insight of deficits. several occasions of unsteadiness without intervention required        Cognition Arousal/Alertness: Awake/alert Behavior During Therapy: WFL for tasks assessed/performed;Impulsive Overall Cognitive Status: No family/caregiver present to determine baseline cognitive functioning          General Comments: Pt is A and O x 4 and able to follow multistep commands consistently. Very motivted to return home however unwilling to have future PT.             Pertinent Vitals/Pain Pain Assessment: No/denies pain           PT Goals (current goals can now be found in the care plan section) Acute Rehab PT Goals Patient Stated Goal: to return home Progress towards PT goals: Progressing toward goals    Frequency    Min 2X/week      PT Plan Current plan remains appropriate  AM-PAC PT "6 Clicks" Mobility   Outcome Measure  Help needed turning from your back to your side while in a flat bed without using bedrails?: None Help needed moving from lying on your back to sitting on the side of a  flat bed without using bedrails?: None Help needed moving to and from a bed to a chair (including a wheelchair)?: A Little Help needed standing up from a chair using your arms (e.g., wheelchair or bedside chair)?: A Little Help needed to walk in hospital room?: A Little Help needed climbing 3-5 steps with a railing? : A Little 6 Click Score: 20    End of Session   Activity Tolerance: Patient tolerated treatment well;No increased pain Patient left: in bed;with call bell/phone within reach;with nursing/sitter in room Nurse Communication: Mobility status PT Visit Diagnosis: Unsteadiness on feet (R26.81);Difficulty in walking, not elsewhere classified (R26.2);Ataxic gait (R26.0);Other symptoms and signs involving the nervous system (N23.557)     Time: 3220-2542 PT Time Calculation (min) (ACUTE ONLY): 14 min  Charges:  $Gait Training: 8-22 mins                     Jetta Lout PTA 05/19/20, 9:59 AM

## 2020-05-19 NOTE — Care Management Important Message (Signed)
Important Message  Patient Details  Name: Peter Page MRN: [id number] Date of Birth: February 10, 1961   Medicare Important Message Given:  No  Patient discharged prior to arrival to unit to deliver concurrent Medicare IM.     Johnell Comings 05/19/2020, 1:31 PM

## 2020-05-19 NOTE — TOC Transition Note (Signed)
Transition of Care [facility]) - CM/SW Discharge Note   Patient Details  Name: Peter Page MRN: [id number] Date of Birth: Oct 20, 1960  [facility] CM/SW Contact:  Liliana Cline, LCSW Phone Number: 05/19/2020, 12:51 PM   Clinical Narrative:   CSW asked by Dr. Georgeann Oppenheim to schedule PCP appointment for patient. Per Dr. Georgeann Oppenheim, patient needs PCP appointment and cardiac monitor before discharge. However, patient was discharged prior to Cardiology follow up and PCP appointment being scheduled, MD aware. CSW attempted call to patient's phone, line disconnected. Left voicemail on daughter's phone, informed her of Cardiologist's contact information per Dr. Rudene Christians request. Reached out to [facility] about scheduling a PCP appointment, Receptionist at [facility] will reach out to patient via daughter's phone to schedule PCP appointment.    Final next level of care: Home/Self Care Barriers to Discharge: Continued Medical Work up   Patient Goals and CMS Choice Patient states their goals for this hospitalization and ongoing recovery are:: refused SNF, HH, and OPPT CMS Medicare.gov Compare Post Acute Care list provided to:: Patient Choice offered to / list presented to : Patient  Discharge Placement                Patient to be transferred to facility by: family      Discharge Plan and Services                                     Social Determinants of Health (SDOH) Interventions     Readmission Risk Interventions No flowsheet data found.

## 2020-05-19 NOTE — Discharge Summary (Signed)
Physician Discharge Summary  Peter Page TRR:[id number] DOB: 1960/09/22 DOA: 05/15/2020  PCP: Patient, No Pcp Per  Admit date: 05/15/2020 Discharge date: 05/19/2020  Admitted From: Home Disposition: Home  Recommendations for Outpatient Follow-up:  1. Follow up with PCP in 1-2 weeks 2. Follow up with neurology  3. Follow up with cardiology  Home Health:No, recommended but patient refused Equipment/Devices: None Discharge Condition: Stable CODE STATUS: Full Diet recommendation: Heart Healthy  Brief/Interim Summary: Peter Pettway Brownis a 59 y.o.malewith medical history significant fortobacco abuse until 2 years ago who was brought in by EMS after syncopal episode witnessed by his family. Following the episode he complained of dizziness on standing and nausea.  Found on imaging to have bilateral cerebellar infarcts.  Neurology consulted.  Hospital course has demonstrated improvement.  Patient unable to ambulate with minimal assistance.  Physical therapy recommending outpatient physical therapy versus home health.  Patient continues to experience headaches.  Communicated with neurology.  Minimize narcotics and NSAID administration while on DAPT.  On day of discharge patient states that his symptoms had resolved.  No further headaches.  Communicated with neurology, okay with discharge with referral to outpatient neurology for follow-up.  This is been arranged via Licensed conveyancer.  Medications reflected in discharge MAR.  Appropriate prescriptions provided.  We attempted to get the patient a primary care and cardiology follow-up prior to discharge however the patient was inadvertently discharged prior to these appointments being set up.  I have notified TOC representative who will attempt to contact the patient via phone  Discharge Diagnoses:  Active Problems:   Cerebellar stroke, acute (HCC)   History of tobacco use   Acute CVA (cerebrovascular accident) (HCC)   Hypotension    Dizziness   Chronic obstructive pulmonary disease (HCC)   Subclavian artery stenosis, left (HCC)   Nausea   Nonintractable headache   Essential hypertension   Hypokalemia   Malnutrition of moderate degree  Acute bilateral cerebellar strokes Patient was not a TPA candidate Neurology following since admission Physical therapy initially recommending rehab.  Now adjusting recommendations to outpatient versus home health [facility] aware Patient refused outpatient PT referral and home health Will prescribe DAPT aspirin and Plavix on discharge Follow-up with outpatient neurology Statin Lipitor 40 mg daily prescribed We attempted to get patient in a 30-day event monitor prior to discharge however patient was inadvertently discharged prior to these appointments being arranged.  Case management will attempt to reach out to patient via phone to have these appointment set up  Intractable headache, resolved Repeat CT demonstrating evolution of cerebellar infarct Communicated with neurology Okay for discharge Tylenol 650 mg every 6 hours scheduled x2 days, followed by 3-4 doses weekly as needed Benadryl 12.5mg  3 times daily x2 days, followed by 3-4 doses weekly as needed Outpatient neurology follow-up  Essential hypertension Allow some permissive hypertension Blood pressures in right arm We will prescribe amlodipine 5 mg daily on discharge Will likely need further blood pressure titration Outpatient follow-up PCP versus neurology versus cardiology  Left subclavian stenosis Vascular surgery consulted during this admission No acute management Outpatient follow-up Patient will follow up with Dr. Wyn Quaker 2 weeks post discharge  COPD Suspected from chest imaging findings Patient on room air without evidence of exacerbation Outpatient follow-up  Discharge Instructions  Discharge Instructions    Diet - low sodium heart healthy   Complete by: As directed    Increase activity slowly   Complete  by: As directed      Allergies as of 05/19/2020  No Known Allergies     Medication List    TAKE these medications   acetaminophen 325 MG tablet Commonly known as: TYLENOL Take 2 tablets (650 mg total) by mouth every 6 (six) hours for 2 days. OTC medication.  Can take as needed after 2 days but limit intake to 3-4 tablets a week   amLODipine 5 MG tablet Commonly known as: NORVASC Take 1 tablet (5 mg total) by mouth daily. Start taking on: May 20, 2020   aspirin 81 MG EC tablet Take 1 tablet (81 mg total) by mouth daily. Swallow whole. Start taking on: May 20, 2020   atorvastatin 40 MG tablet Commonly known as: LIPITOR Take 1 tablet (40 mg total) by mouth daily. Start taking on: May 20, 2020   clopidogrel 75 MG tablet Commonly known as: PLAVIX Take 1 tablet (75 mg total) by mouth daily. Start taking on: May 20, 2020   diphenhydrAMINE 12.5 MG/5ML elixir Commonly known as: BENADRYL Take 5 mLs (12.5 mg total) by mouth every 6 (six) hours for 2 days. Can continue to take as needed for headache after 2 days.  Limit to 3-4 doses a week   famotidine 20 MG tablet Commonly known as: PEPCID Take 1 tablet (20 mg total) by mouth 2 (two) times daily.   loperamide 2 MG tablet Commonly known as: IMODIUM A-D Take 2 tablets (4 mg total) by mouth 4 (four) times daily as needed for diarrhea or loose stools.   ondansetron 4 MG disintegrating tablet Commonly known as: Zofran ODT Take 1 tablet (4 mg total) by mouth every 8 (eight) hours as needed for nausea or vomiting.            Durable Medical Equipment  (From admission, onward)         Start     Ordered   05/18/20 1043  For home use only DME Walker rolling  Once       Question Answer Comment  Walker: With 5 Inch Wheels   Patient needs a walker to treat with the following condition CVA (cerebral vascular accident) (HCC)      05/18/20 1042          Follow-up Information    Annice Needy, MD. Go on  06/07/2020.   Specialties: Vascular Surgery, Radiology, Interventional Cardiology Why: @1 :00 PM  Contact information: [address] [place] [address] [phone]        Naoma Diener, NP. Go on 05/23/2020.   Specialty: Neurology Why: @1 :30 PM Near emergency room entrance on the second floor.  Contact information: [address] [place] [address] [phone]              No Known Allergies  Consultations:  Neurology   Procedures/Studies: CT Code Stroke CTA Head W/WO contrast  Result Date: 05/16/2020 CLINICAL DATA:  Dizziness and vertigo. 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 EXAM: CT ANGIOGRAPHY HEAD AND NECK TECHNIQUE: Multidetector CT imaging of the head and neck was performed using the standard protocol during bolus administration of intravenous contrast. Multiplanar CT image reconstructions and MIPs were obtained to evaluate the vascular anatomy. Carotid stenosis measurements (when applicable) are obtained utilizing NASCET criteria, using the distal internal carotid diameter as the denominator. CONTRAST:  75mL OMNIPAQUE IOHEXOL 350 MG/ML SOLN COMPARISON:  05/15/2020 FINDINGS: CT HEAD FINDINGS Brain: Small areas of low-density visible within the cerebellum, particularly in the region of the vermis. No evidence of mass effect or hemorrhage. Cerebral hemispheres appear normal by CT.  No evidence of stroke, mass, hemorrhage, hydrocephalus or extra-axial collection. Vascular: There is atherosclerotic calcification of the major vessels at the base of the brain. Skull: Negative Sinuses: Clear Orbits: Normal Review of the MIP images confirms the above findings CTA NECK FINDINGS Aortic arch: Aortic atherosclerotic calcification. Branching pattern is normal without origin stenosis. Right carotid system: Common carotid artery widely patent to the bifurcation. Soft and calcified plaque at the ICA bulb but without stenosis when compared to the more distal  cervical ICA diameter. Left carotid system: Common carotid artery widely patent to the bifurcation. No soft or calcified plaque at the bifurcation or bulb. Cervical ICA widely patent. Vertebral arteries: Right subclavian artery is normal. Right vertebral origin is widely patent. 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 This appears to be due to soft plaque. Left vertebral artery origin is widely patent. Left vertebral artery is normal through the cervical region to the foramen magnum. Skeleton: Degenerative cervical spondylosis and facet osteoarthritis. Other neck: No soft tissue mass or lymphadenopathy. Upper chest: Severe emphysema in the upper lungs. Review of the MIP images confirms the above findings CTA HEAD FINDINGS Anterior circulation: Both internal carotid arteries patent through the skull base and siphon regions. Ordinary siphon atherosclerotic calcification but no stenosis greater than 30%. The anterior and middle cerebral vessels are patent without proximal stenosis, aneurysm or vascular malformation. No large or medium vessel occlusion. Posterior circulation: Right vertebral artery is a large vessel widely patent through the foramen magnum. There is calcified plaque affecting the right vertebral V4 segment but no stenosis. I think there is a very small right PICA. 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 No antegrade flow through the foramen magnum. No left PICA demonstrated. No basilar stenosis. Small bilateral anterior inferior cerebellar arteries show flow. Superior cerebellar and posterior cerebral arteries are patent. Venous sinuses: Patent and normal. Anatomic variants: None significant. Review of the MIP images confirms the above findings IMPRESSION: 1. Severe stenosis of the left subclavian artery proximal to the left vertebral artery origin due to soft plaque with near occlusion, diameter  1 mm. 2. No significant carotid bifurcation disease. 3. No intracranial anterior circulation large or medium vessel occlusion. 4. 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 No antegrade flow through the foramen magnum. Right vertebral artery widely patent. No basilar stenosis. Superior cerebellar and posterior cerebral arteries are patent. No left PICA flow demonstrated. 5. Severe emphysema. Aortic Atherosclerosis (ICD10-I70.0) and Emphysema (ICD10-J43.9). Electronically Signed   By: Paulina Fusi M.D.   On: 05/16/2020 11:03   DG Orbits  Result Date: 05/15/2020 CLINICAL DATA:  Preprocedure clearance for MRI, syncope, dizziness EXAM: ORBITS - COMPLETE 4+ VIEW COMPARISON:  None. FINDINGS: There is no evidence of metallic foreign body within the orbits. No significant bone abnormality identified. IMPRESSION: No evidence of metallic foreign body within the orbits. Electronically Signed   By: Sharlet Salina M.D.   On: 05/15/2020 22:35   CT HEAD WO CONTRAST  Result Date: 05/18/2020 CLINICAL DATA:  Headache. Dizziness. Bilateral cerebellar infarcts. EXAM: CT HEAD WITHOUT CONTRAST TECHNIQUE: Contiguous axial images were obtained from the base of the skull through the vertex without intravenous contrast. COMPARISON:  CT 05/16/2020.  MRI 05/15/2020 FINDINGS: Brain: Acute bilateral cerebellar infarcts appear more conspicuous by CT on today's study, particularly within the vermis. No evidence of hemorrhagic transformation. No extra-axial collection. No hydrocephalus. Vascular: Atherosclerotic  calcifications involving the large vessels of the skull base. No unexpected hyperdense vessel. Skull: Normal. Negative for fracture or focal lesion. Sinuses/Orbits: No acute finding. Other: None. IMPRESSION: Continued evolution of acute bilateral cerebellar infarcts appear. No evidence of hemorrhagic transformation. Electronically Signed   By: Duanne GuessNicholas  Plundo D.O.   On:  05/18/2020 08:34   CT Code Stroke CTA Neck W/WO contrast  Result Date: 05/16/2020 CLINICAL DATA:  Dizziness and vertigo. 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 EXAM: CT ANGIOGRAPHY HEAD AND NECK TECHNIQUE: Multidetector CT imaging of the head and neck was performed using the standard protocol during bolus administration of intravenous contrast. Multiplanar CT image reconstructions and MIPs were obtained to evaluate the vascular anatomy. Carotid stenosis measurements (when applicable) are obtained utilizing NASCET criteria, using the distal internal carotid diameter as the denominator. CONTRAST:  75mL OMNIPAQUE IOHEXOL 350 MG/ML SOLN COMPARISON:  05/15/2020 FINDINGS: CT HEAD FINDINGS Brain: Small areas of low-density visible within the cerebellum, particularly in the region of the vermis. No evidence of mass effect or hemorrhage. Cerebral hemispheres appear normal by CT. No evidence of stroke, mass, hemorrhage, hydrocephalus or extra-axial collection. Vascular: There is atherosclerotic calcification of the major vessels at the base of the brain. Skull: Negative Sinuses: Clear Orbits: Normal Review of the MIP images confirms the above findings CTA NECK FINDINGS Aortic arch: Aortic atherosclerotic calcification. Branching pattern is normal without origin stenosis. Right carotid system: Common carotid artery widely patent to the bifurcation. Soft and calcified plaque at the ICA bulb but without stenosis when compared to the more distal cervical ICA diameter. Left carotid system: Common carotid artery widely patent to the bifurcation. No soft or calcified plaque at the bifurcation or bulb. Cervical ICA widely patent. Vertebral arteries: Right subclavian artery is normal. Right vertebral origin is widely patent. 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 This appears to be due to soft plaque. Left vertebral artery origin is widely patent. Left vertebral artery  is normal through the cervical region to the foramen magnum. Skeleton: Degenerative cervical spondylosis and facet osteoarthritis. Other neck: No soft tissue mass or lymphadenopathy. Upper chest: Severe emphysema in the upper lungs. Review of the MIP images confirms the above findings CTA HEAD FINDINGS Anterior circulation: Both internal carotid arteries patent through the skull base and siphon regions. Ordinary siphon atherosclerotic calcification but no stenosis greater than 30%. The anterior and middle cerebral vessels are patent without proximal stenosis, aneurysm or vascular malformation. No large or medium vessel occlusion. Posterior circulation: Right vertebral artery is a large vessel widely patent through the foramen magnum. There is calcified plaque affecting the right vertebral V4 segment but no stenosis. I think there is a very small right PICA. 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 No antegrade flow through the foramen magnum. No left PICA demonstrated. No basilar stenosis. Small bilateral anterior inferior cerebellar arteries show flow. Superior cerebellar and posterior cerebral arteries are patent. Venous sinuses: Patent and normal. Anatomic variants: None significant. Review of the MIP images confirms the above findings IMPRESSION: 1. 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 2. No significant carotid bifurcation disease. 3. No intracranial anterior circulation large or medium vessel occlusion. 4. 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 No antegrade flow through the foramen magnum. Right vertebral artery widely patent.  No basilar stenosis. Superior cerebellar and posterior cerebral arteries are patent. No left PICA flow demonstrated. 5. Severe emphysema. Aortic  Atherosclerosis (ICD10-I70.0) and Emphysema (ICD10-J43.9). Electronically Signed   By: Paulina Fusi M.D.   On: 05/16/2020 11:03   MR ANGIO HEAD WO CONTRAST  Result Date: 05/16/2020 CLINICAL DATA:  Dizziness, vertigo EXAM: MRI HEAD WITHOUT CONTRAST MRA HEAD WITHOUT CONTRAST TECHNIQUE: Multiplanar, multiecho pulse sequences of the brain and surrounding structures were obtained without intravenous contrast. Angiographic images of the head were obtained using MRA technique without contrast. COMPARISON:  None. FINDINGS: MRI HEAD FINDINGS Brain: Multifocal acute ischemia within both cerebellar hemispheres. Multifocal hyperintense T2-weighted signal within the white matter. Normal volume of CSF spaces. No chronic microhemorrhage. Normal midline structures. Vascular: Normal flow voids. Skull and upper cervical spine: Normal marrow signal. Sinuses/Orbits: Negative. Other: None. MRA HEAD FINDINGS POSTERIOR CIRCULATION: --Vertebral arteries: Left vertebral artery terminates in PICA. --Inferior cerebellar arteries: Intermittent loss of enhancement in the left PICA. No right PICA enhancement visualized. --Basilar artery: Normal. --Superior cerebellar arteries: Normal. --Posterior cerebral arteries: Normal. ANTERIOR CIRCULATION: --Intracranial internal carotid arteries: Normal. --Anterior cerebral arteries (ACA): Normal. --Middle cerebral arteries (MCA): Normal. ANATOMIC VARIANTS: Fetal origin of the right PCA. IMPRESSION: 1. Multifocal acute ischemia within both cerebellar hemispheres. No hemorrhage or mass effect. 2. 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 Electronically Signed   By: Deatra Robinson M.D.   On: 05/16/2020 00:07   MR BRAIN WO CONTRAST  Result Date: 05/16/2020 CLINICAL DATA:  Dizziness, vertigo EXAM: MRI HEAD WITHOUT CONTRAST MRA HEAD WITHOUT CONTRAST TECHNIQUE: Multiplanar, multiecho pulse sequences of the brain and surrounding structures were obtained without intravenous contrast.  Angiographic images of the head were obtained using MRA technique without contrast. COMPARISON:  None. FINDINGS: MRI HEAD FINDINGS Brain: Multifocal acute ischemia within both cerebellar hemispheres. Multifocal hyperintense T2-weighted signal within the white matter. Normal volume of CSF spaces. No chronic microhemorrhage. Normal midline structures. Vascular: Normal flow voids. Skull and upper cervical spine: Normal marrow signal. Sinuses/Orbits: Negative. Other: None. MRA HEAD FINDINGS POSTERIOR CIRCULATION: --Vertebral arteries: Left vertebral artery terminates in PICA. --Inferior cerebellar arteries: Intermittent loss of enhancement in the left PICA. No right PICA enhancement visualized. --Basilar artery: Normal. --Superior cerebellar arteries: Normal. --Posterior cerebral arteries: Normal. ANTERIOR CIRCULATION: --Intracranial internal carotid arteries: Normal. --Anterior cerebral arteries (ACA): Normal. --Middle cerebral arteries (MCA): Normal. ANATOMIC VARIANTS: Fetal origin of the right PCA. IMPRESSION: 1. Multifocal acute ischemia within both cerebellar hemispheres. No hemorrhage or mass effect. 2. 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 Electronically Signed   By: Deatra Robinson M.D.   On: 05/16/2020 00:07   US Carotid Bilateral (at [facility] and [facility] only)  Result Date: 05/16/2020 CLINICAL DATA:  Stroke. EXAM: BILATERAL CAROTID DUPLEX ULTRASOUND TECHNIQUE: Wallace Cullens scale imaging, color Doppler and duplex ultrasound were performed of bilateral carotid and vertebral arteries in the neck. COMPARISON:  CTA neck 05/16/2020 FINDINGS: Criteria: Quantification of carotid stenosis is based on velocity parameters that correlate the residual internal carotid diameter with NASCET-based stenosis levels, using the diameter of the distal internal carotid lumen as the denominator for stenosis measurement. The following velocity measurements were obtained: RIGHT ICA: 87/29 cm/sec CCA: 69/18 cm/sec SYSTOLIC  ICA/CCA RATIO:  1.3 ECA: 89 cm/sec LEFT ICA: 111/38 cm/sec CCA: 84/21 cm/sec SYSTOLIC ICA/CCA RATIO:  1.3 ECA: 106 cm/sec RIGHT CAROTID ARTERY: Small amount of plaque in the distal common carotid artery and carotid bulb. External carotid artery is patent with normal waveform. Small amount  of plaque in the proximal internal carotid artery. Normal waveforms and velocities in the internal carotid artery. RIGHT VERTEBRAL ARTERY: Antegrade flow and normal waveform in the right vertebral artery. LEFT CAROTID ARTERY: Intimal thickening at the left carotid bulb. External carotid artery is patent with normal waveform. Normal waveforms and velocities in the internal carotid artery. LEFT VERTEBRAL ARTERY: Antegrade flow and normal waveform in the left vertebral artery. IMPRESSION: 1. Small amount of atherosclerotic plaque involving the carotid arteries, most prominent at the right carotid bulb region. Estimated degree of stenosis in the internal carotid arteries is less than 50% bilaterally. 2. Patent vertebral arteries with antegrade flow. Electronically Signed   By: Richarda Overlie M.D.   On: 05/16/2020 12:04   DG Chest Portable 1 View  Result Date: 05/15/2020 CLINICAL DATA:  Syncope, dizziness EXAM: PORTABLE CHEST 1 VIEW COMPARISON:  None. FINDINGS: 2 frontal views of the chest demonstrate an unremarkable cardiac silhouette. Diffuse interstitial prominence and scarring throughout the lungs compatible with emphysema. No airspace disease, effusion, or pneumothorax. No acute bony abnormalities. IMPRESSION: 1. Emphysema.  No acute airspace disease. Electronically Signed   By: Sharlet Salina M.D.   On: 05/15/2020 17:42   ECHOCARDIOGRAM COMPLETE  Result Date: 05/17/2020    ECHOCARDIOGRAM REPORT   Patient Name:   Peter Page Date of Exam: 05/17/2020 Medical Rec #:  [id number]         Height:       64.0 in Accession #:    [id number]        Weight:       127.9 lb Date of Birth:  01-24-1961         BSA:          1.618 m  Patient Age:    59 years          BP:           155/89 mmHg Patient Gender: M                 HR:           46 bpm. Exam Location:  [facility] Procedure: 2D Echo, Cardiac Doppler and Color Doppler Indications:    Stroke 434.91  History:        Patient has no prior history of Echocardiogram examinations.                 COPD.  Sonographer:    Cristela Blue RDCS (AE) Referring Phys: [id number] Andris Baumann Diagnosing      Yvonne Kendall MD Phys:  Sonographer Comments: Global longitudinal strain was attempted. IMPRESSIONS  1. Left ventricular ejection fraction, by estimation, is 60 to 65%. The left ventricle has normal function. Left ventricular endocardial border not optimally defined to evaluate regional wall motion. Left ventricular diastolic parameters were normal.  2. Right ventricular systolic function is normal. The right ventricular size is normal. There is normal pulmonary artery systolic pressure.  3. The mitral valve is normal in structure. Trivial mitral valve regurgitation. No evidence of mitral stenosis.  4. The aortic valve was not well visualized. Aortic valve regurgitation is not visualized. No aortic stenosis is present.  5. Aortic dilatation noted. There is mild dilatation of the aortic root, measuring 41 mm.  6. The inferior vena cava is normal in size with greater than 50% respiratory variability, suggesting right atrial pressure of 3 mmHg. FINDINGS  Left Ventricle: Left ventricular ejection fraction, by estimation, is 60 to 65%. The left ventricle has normal function.  Left ventricular endocardial border not optimally defined to evaluate regional wall motion. The left ventricular internal cavity size was normal in size. There is borderline left ventricular hypertrophy. Left ventricular diastolic parameters were normal. Right Ventricle: The right ventricular size is normal. No increase in right ventricular wall thickness. Right ventricular systolic function is normal. There is normal pulmonary artery  systolic pressure. The tricuspid regurgitant velocity is 2.09 m/s, and  with an assumed right atrial pressure of 3 mmHg, the estimated right ventricular systolic pressure is 20.5 mmHg. Left Atrium: Left atrial size was normal in size. Right Atrium: Right atrial size was normal in size. Pericardium: There is no evidence of pericardial effusion. Mitral Valve: The mitral valve is normal in structure. Trivial mitral valve regurgitation. No evidence of mitral valve stenosis. Tricuspid Valve: The tricuspid valve is normal in structure. Tricuspid valve regurgitation is trivial. Aortic Valve: The aortic valve was not well visualized. Aortic valve regurgitation is not visualized. No aortic stenosis is present. Aortic valve mean gradient measures 3.0 mmHg. Aortic valve peak gradient measures 5.7 mmHg. Aortic valve area, by VTI measures 3.00 cm. Pulmonic Valve: The pulmonic valve was not well visualized. Pulmonic valve regurgitation is not visualized. No evidence of pulmonic stenosis. Aorta: Aortic dilatation noted. There is mild dilatation of the aortic root, measuring 41 mm. Pulmonary Artery: The pulmonary artery is not well seen. Venous: The inferior vena cava is normal in size with greater than 50% respiratory variability, suggesting right atrial pressure of 3 mmHg. IAS/Shunts: The interatrial septum was not well visualized.  LEFT VENTRICLE PLAX 2D LVIDd:         4.03 cm  Diastology LVIDs:         2.03 cm  LV e' medial:    7.72 cm/s LV PW:         1.16 cm  LV E/e' medial:  10.9 LV IVS:        0.84 cm  LV e' lateral:   10.30 cm/s LVOT diam:     2.20 cm  LV E/e' lateral: 8.2 LV SV:         68 LV SV Index:   42 LVOT Area:     3.80 cm                          3D Volume EF:                         3D EF:        60 %                         LV EDV:       119 ml                         LV ESV:       48 ml                         LV SV:        72 ml RIGHT VENTRICLE RV Basal diam:  2.78 cm RV S prime:     13.80 cm/s TAPSE  (M-mode): 2.0 cm LEFT ATRIUM             Index       RIGHT ATRIUM           Index  LA diam:        3.80 cm 2.35 cm/m  RA Area:     15.50 cm LA Vol (A2C):   39.5 ml 24.42 ml/m RA Volume:   42.10 ml  26.03 ml/m LA Vol (A4C):   25.9 ml 16.01 ml/m LA Biplane Vol: 33.5 ml 20.71 ml/m  AORTIC VALVE                   PULMONIC VALVE AV Area (Vmax):    2.61 cm    PV Vmax:        0.79 m/s AV Area (Vmean):   2.83 cm    PV Peak grad:   2.5 mmHg AV Area (VTI):     3.00 cm    RVOT Peak grad: 3 mmHg AV Vmax:           119.00 cm/s AV Vmean:          70.900 cm/s AV VTI:            0.228 m AV Peak Grad:      5.7 mmHg AV Mean Grad:      3.0 mmHg LVOT Vmax:         81.60 cm/s LVOT Vmean:        52.800 cm/s LVOT VTI:          0.180 m LVOT/AV VTI ratio: 0.79  AORTA Ao Root diam: 4.00 cm MITRAL VALVE               TRICUSPID VALVE MV Area (PHT): 4.19 cm    TR Peak grad:   17.5 mmHg MV Decel Time: 181 msec    TR Vmax:        209.00 cm/s MV E velocity: 84.00 cm/s MV A velocity: 59.60 cm/s  SHUNTS MV E/A ratio:  1.41        Systemic VTI:  0.18 m                            Systemic Diam: 2.20 cm Yvonne Kendall MD Electronically signed by Yvonne Kendall MD Signature Date/Time: 05/17/2020/2:10:37 PM    Final     (Echo, Carotid, EGD, Colonoscopy, ERCP)    Subjective:   Discharge Exam: Vitals:   05/19/20 0538 05/19/20 0830  BP: (!) 146/112 103/77  Pulse: 72 66  Resp: 17 16  Temp: (!) 97.5 F (36.4 C) 98.4 F (36.9 C)  SpO2: 99% 98%   Vitals:   05/18/20 1901 05/19/20 0014 05/19/20 0538 05/19/20 0830  BP: (!) 127/112 123/88 (!) 146/112 103/77  Pulse: 74 76 72 66  Resp: 16 17 17 16   Temp: 97.8 F (36.6 C) 98.2 F (36.8 C) (!) 97.5 F (36.4 C) 98.4 F (36.9 C)  TempSrc: Oral Oral    SpO2: 97% 97% 99% 98%  Weight:      Height:        General: Pt is alert, awake, not in acute distress Cardiovascular: RRR, S1/S2 +, no rubs, no gallops Respiratory: CTA bilaterally, no wheezing, no rhonchi Abdominal: Soft,  NT, ND, bowel sounds + Extremities: no edema, no cyanosis    The results of significant diagnostics from this hospitalization (including imaging, microbiology, ancillary and laboratory) are listed below for reference.     Microbiology: Recent Results (from the past 240 hour(s))  Respiratory Panel by RT PCR (Flu A&B, Covid) - Nasopharyngeal Swab     Status: None   Collection Time: 05/15/20  9:02  PM   Specimen: Nasopharyngeal Swab  Result Value Ref Range Status   SARS Coronavirus 2 by RT PCR NEGATIVE NEGATIVE Final    Comment: (NOTE) SARS-CoV-2 target nucleic acids are NOT DETECTED.  The SARS-CoV-2 RNA is generally detectable in upper respiratoy specimens during the acute phase of infection. The lowest concentration of SARS-CoV-2 viral copies this assay can detect is 131 copies/mL. A negative result does not preclude SARS-Cov-2 infection and should not be used as the sole basis for treatment or other patient management decisions. A negative result may occur with  improper specimen collection/handling, submission of specimen other than nasopharyngeal swab, presence of viral mutation(s) within the areas targeted by this assay, and inadequate number of viral copies (<131 copies/mL). A negative result must be combined with clinical observations, patient history, and epidemiological information. The expected result is Negative.  Fact Sheet for Patients:  https://www.moore.com/  Fact Sheet for Healthcare Providers:  https://www.young.biz/  This test is no t yet approved or cleared by the [place] FDA and  has been authorized for detection and/or diagnosis of SARS-CoV-2 by FDA under an Emergency Use Authorization (EUA). This EUA will remain  in effect (meaning this test can be used) for the duration of the COVID-19 declaration under Section 564(b)(1) of the Act, 21 U.S.C. section 360bbb-3(b)(1), unless the authorization is terminated  or revoked sooner.     Influenza A by PCR NEGATIVE NEGATIVE Final   Influenza B by PCR NEGATIVE NEGATIVE Final    Comment: (NOTE) The Xpert Xpress SARS-CoV-2/FLU/RSV assay is intended as an aid in  the diagnosis of influenza from Nasopharyngeal swab specimens and  should not be used as a sole basis for treatment. Nasal washings and  aspirates are unacceptable for Xpert Xpress SARS-CoV-2/FLU/RSV  testing.  Fact Sheet for Patients: https://www.moore.com/  Fact Sheet for Healthcare Providers: https://www.young.biz/  This test is not yet approved or cleared by the [place] FDA and  has been authorized for detection and/or diagnosis of SARS-CoV-2 by  FDA under an Emergency Use Authorization (EUA). This EUA will remain  in effect (meaning this test can be used) for the duration of the  Covid-19 declaration under Section 564(b)(1) of the Act, 21  U.S.C. section 360bbb-3(b)(1), unless the authorization is  terminated or revoked. Performed at [facility], [address] [place], [place] [address]      Labs: BNP (last 3 results) No results for input(s): BNP in the last 8760 hours. Basic Metabolic Panel: Recent Labs  Lab 05/15/20 1609 05/16/20 0235 05/17/20 0725 05/18/20 0419  NA 140  --  137 137  K 3.7  --  3.4* 4.0  CL 107  --  105 105  CO2 21*  --  21* 23  GLUCOSE 145*  --  95 101*  BUN 12  --  8 10  CREATININE 0.91 0.98 0.90 0.83  CALCIUM 8.2*  --  8.3* 8.5*  MG  --   --   --  2.2   Liver Function Tests: Recent Labs  Lab 05/15/20 1609 05/17/20 0725  AST 31 24  ALT 20 17  ALKPHOS 72  --   BILITOT 0.8 0.9  PROT 6.6  --   ALBUMIN 3.6  --    Recent Labs  Lab 05/15/20 1609  LIPASE 21   No results for input(s): AMMONIA in the last 168 hours. CBC: Recent Labs  Lab 05/15/20 1609 05/16/20 0235 05/17/20 0725  WBC 8.6 7.0 6.4  NEUTROABS 6.2  --  4.1  HGB 14.0 12.6* 14.3  HCT 41.3 38.4* 41.8  MCV 92.4  93.9 91.7  PLT 304 242 256   Cardiac Enzymes: Recent Labs  Lab 05/17/20 0725  CKTOTAL 287   BNP: Invalid input(s): POCBNP CBG: No results for input(s): GLUCAP in the last 168 hours. D-Dimer No results for input(s): DDIMER in the last 72 hours. Hgb A1c No results for input(s): HGBA1C in the last 72 hours. Lipid Profile No results for input(s): CHOL, HDL, LDLCALC, TRIG, CHOLHDL, LDLDIRECT in the last 72 hours. Thyroid function studies Recent Labs    05/17/20 1439  TSH 0.872   Anemia work up Recent Labs    05/17/20 0726  RETICCTPCT 1.2   Urinalysis    Component Value Date/Time   COLORURINE YELLOW (A) 05/16/2020 0235   APPEARANCEUR HAZY (A) 05/16/2020 0235   LABSPEC 1.028 05/16/2020 0235   PHURINE 6.0 05/16/2020 0235   GLUCOSEU NEGATIVE 05/16/2020 0235   HGBUR NEGATIVE 05/16/2020 0235   BILIRUBINUR NEGATIVE 05/16/2020 0235   KETONESUR NEGATIVE 05/16/2020 0235   PROTEINUR NEGATIVE 05/16/2020 0235   NITRITE NEGATIVE 05/16/2020 0235   LEUKOCYTESUR NEGATIVE 05/16/2020 0235   Sepsis Labs Invalid input(s): PROCALCITONIN,  WBC,  LACTICIDVEN Microbiology Recent Results (from the past 240 hour(s))  Respiratory Panel by RT PCR (Flu A&B, Covid) - Nasopharyngeal Swab     Status: None   Collection Time: 05/15/20  9:02 PM   Specimen: Nasopharyngeal Swab  Result Value Ref Range Status   SARS Coronavirus 2 by RT PCR NEGATIVE NEGATIVE Final    Comment: (NOTE) SARS-CoV-2 target nucleic acids are NOT DETECTED.  The SARS-CoV-2 RNA is generally detectable in upper respiratoy specimens during the acute phase of infection. The lowest concentration of SARS-CoV-2 viral copies this assay can detect is 131 copies/mL. A negative result does not preclude SARS-Cov-2 infection and should not be used as the sole basis for treatment or other patient management decisions. A negative result may occur with  improper specimen collection/handling, submission of specimen other than  nasopharyngeal swab, presence of viral mutation(s) within the areas targeted by this assay, and inadequate number of viral copies (<131 copies/mL). A negative result must be combined with clinical observations, patient history, and epidemiological information. The expected result is Negative.  Fact Sheet for Patients:  https://www.moore.com/  Fact Sheet for Healthcare Providers:  https://www.young.biz/  This test is no t yet approved or cleared by the [place] FDA and  has been authorized for detection and/or diagnosis of SARS-CoV-2 by FDA under an Emergency Use Authorization (EUA). This EUA will remain  in effect (meaning this test can be used) for the duration of the COVID-19 declaration under Section 564(b)(1) of the Act, 21 U.S.C. section 360bbb-3(b)(1), unless the authorization is terminated or revoked sooner.     Influenza A by PCR NEGATIVE NEGATIVE Final   Influenza B by PCR NEGATIVE NEGATIVE Final    Comment: (NOTE) The Xpert Xpress SARS-CoV-2/FLU/RSV assay is intended as an aid in  the diagnosis of influenza from Nasopharyngeal swab specimens and  should not be used as a sole basis for treatment. Nasal washings and  aspirates are unacceptable for Xpert Xpress SARS-CoV-2/FLU/RSV  testing.  Fact Sheet for Patients: https://www.moore.com/  Fact Sheet for Healthcare Providers: https://www.young.biz/  This test is not yet approved or cleared by the [place] FDA and  has been authorized for detection and/or diagnosis of SARS-CoV-2 by  FDA under an Emergency Use Authorization (EUA). This EUA will remain  in effect (meaning this test can  be used) for the duration of the  Covid-19 declaration under Section 564(b)(1) of the Act, 21  U.S.C. section 360bbb-3(b)(1), unless the authorization is  terminated or revoked. Performed at [facility], [address] [place], [place]  [address]      Time coordinating discharge: Over 30 minutes  SIGNED:   Tresa Moore, MD  [facility] 05/19/2020, 2:23 PM Pager   If 7PM-7AM, please contact night-coverage

## 2020-05-23 ENCOUNTER — Telehealth (INDEPENDENT_AMBULATORY_CARE_PROVIDER_SITE_OTHER): Payer: Self-pay

## 2020-05-23 NOTE — Telephone Encounter (Signed)
I attempted to contact the patient and was unable to get through, I then called the patient's daughter and a message was left for her to return my call.

## 2020-05-24 ENCOUNTER — Telehealth (INDEPENDENT_AMBULATORY_CARE_PROVIDER_SITE_OTHER): Payer: Self-pay

## 2020-05-24 NOTE — Telephone Encounter (Signed)
Spoke with the patient and he is scheduled with Dr. Wyn Quaker for a LUE angio on 06/02/20 with a 8:30 am arrival time to the [facility]. Covid testing on 05/31/20 between 8-1 pm at the [facility]. Pre-procedure instructions were discussed and will be mailed.

## 2020-05-31 ENCOUNTER — Other Ambulatory Visit: Payer: Medicare Other | Attending: Vascular Surgery

## 2020-06-01 ENCOUNTER — Other Ambulatory Visit (INDEPENDENT_AMBULATORY_CARE_PROVIDER_SITE_OTHER): Payer: Self-pay | Admitting: Nurse Practitioner

## 2020-06-02 DIAGNOSIS — I771 Stricture of artery: Secondary | ICD-10-CM

## 2020-06-07 ENCOUNTER — Ambulatory Visit (INDEPENDENT_AMBULATORY_CARE_PROVIDER_SITE_OTHER): Payer: Self-pay | Admitting: Vascular Surgery

## 2020-06-07 ENCOUNTER — Other Ambulatory Visit
Admission: RE | Admit: 2020-06-07 | Discharge: 2020-06-07 | Disposition: A | Discharge status: Home / Self Care | Payer: Medicare Other | Source: Ambulatory Visit | Attending: Vascular Surgery | Admitting: Vascular Surgery

## 2020-06-07 ENCOUNTER — Other Ambulatory Visit: Payer: Self-pay

## 2020-06-07 DIAGNOSIS — Z01812 Encounter for preprocedural laboratory examination: Secondary | ICD-10-CM | POA: Insufficient documentation

## 2020-06-07 DIAGNOSIS — U071 COVID-19: Secondary | ICD-10-CM | POA: Diagnosis not present

## 2020-06-07 LAB — SARS CORONAVIRUS 2 (TAT 6-24 HRS): SARS Coronavirus 2: POSITIVE — AB

## 2020-06-08 ENCOUNTER — Telehealth (HOSPITAL_COMMUNITY): Payer: Self-pay | Admitting: Family

## 2020-06-08 DIAGNOSIS — U071 COVID-19: Secondary | ICD-10-CM

## 2020-06-08 NOTE — Telephone Encounter (Signed)
Called to discuss with Georges Lynch about Covid symptoms and the use of casirivimab/imdevimab, a combination monoclonal antibody infusion for those with mild to moderate Covid symptoms and at a high risk of hospitalization.     Pt is qualified for this infusion at the infusion center due to co-morbid conditions and/or a member of an at-risk group, however declines infusion at this time. Symptoms tier reviewed as well as criteria for ending isolation.  Symptoms reviewed that would warrant ED/Hospital evaluation. Preventative practices reviewed. Patient verbalized understanding. Patient advised to call back if he decides that he does want to get infusion. Callback number to the infusion center given. Patient advised to go to Urgent care or ED with severe symptoms.   Pt does not qualify for infusion therapy as HE has asymptomatic infection. Isolation precautions discussed. Advised to contact back for consideration should HE develop symptoms. Patient verbalized understanding.    Patient Active Problem List   Diagnosis Date Noted  . Malnutrition of moderate degree 05/17/2020  . Nausea   . Nonintractable headache   . Essential hypertension   . Hypokalemia   . Cerebellar stroke, acute (HCC) 05/16/2020  . History of tobacco use 05/16/2020  . Acute CVA (cerebrovascular accident) (HCC) 05/16/2020  . Hypotension   . Dizziness   . Chronic obstructive pulmonary disease (HCC)   . Subclavian artery stenosis, left (HCC)     Valkyrie Guardiola,NP

## 2020-06-09 ENCOUNTER — Encounter: Payer: Self-pay | Admitting: Registered Nurse

## 2020-06-09 DIAGNOSIS — I771 Stricture of artery: Secondary | ICD-10-CM

## 2020-06-10 ENCOUNTER — Telehealth (INDEPENDENT_AMBULATORY_CARE_PROVIDER_SITE_OTHER): Payer: Self-pay

## 2020-06-10 NOTE — Telephone Encounter (Signed)
I attempted to contact the patient regarding him being rescheduled for his LUE angio with Dr. Wyn Quaker. Patient has been rescheduled to 06/30/20 with a 6:45 am arrival time to the [facility]. Patient will not need a covid test as he is positive.

## 2020-06-28 ENCOUNTER — Other Ambulatory Visit: Payer: Medicare Other

## 2020-06-30 ENCOUNTER — Encounter: Admission: RE | Payer: Self-pay | Source: Home / Self Care

## 2020-06-30 ENCOUNTER — Ambulatory Visit: Admission: RE | Admit: 2020-06-30 | Payer: Medicare Other | Source: Home / Self Care | Admitting: Vascular Surgery

## 2020-06-30 DIAGNOSIS — I771 Stricture of artery: Secondary | ICD-10-CM

## 2020-06-30 SURGERY — UPPER EXTREMITY ANGIOGRAPHY
Anesthesia: Moderate Sedation | Site: Arm Upper | Laterality: Left

## 2020-07-12 ENCOUNTER — Ambulatory Visit (INDEPENDENT_AMBULATORY_CARE_PROVIDER_SITE_OTHER): Payer: Medicare Other | Admitting: Vascular Surgery

## 2020-11-28 ENCOUNTER — Encounter
Admission: EM | Disposition: A | Discharge status: Home / Self Care | Payer: Self-pay | Source: Home / Self Care | Attending: Internal Medicine

## 2020-11-28 ENCOUNTER — Other Ambulatory Visit: Payer: Self-pay

## 2020-11-28 ENCOUNTER — Emergency Department: Payer: Medicare Other

## 2020-11-28 ENCOUNTER — Inpatient Hospital Stay
Admission: EM | Admit: 2020-11-28 | Discharge: 2020-11-30 | DRG: 246 | Disposition: A | Discharge status: Home / Self Care | Payer: Medicare Other | Attending: Internal Medicine | Admitting: Internal Medicine

## 2020-11-28 ENCOUNTER — Inpatient Hospital Stay
Admit: 2020-11-28 | Discharge: 2020-11-28 | Disposition: A | Discharge status: Home / Self Care | Payer: Medicare Other | Attending: Cardiology | Admitting: Cardiology

## 2020-11-28 ENCOUNTER — Encounter: Payer: Self-pay | Admitting: Emergency Medicine

## 2020-11-28 DIAGNOSIS — Z20822 Contact with and (suspected) exposure to covid-19: Secondary | ICD-10-CM | POA: Diagnosis present

## 2020-11-28 DIAGNOSIS — I213 ST elevation (STEMI) myocardial infarction of unspecified site: Secondary | ICD-10-CM | POA: Diagnosis present

## 2020-11-28 DIAGNOSIS — I959 Hypotension, unspecified: Secondary | ICD-10-CM | POA: Diagnosis present

## 2020-11-28 DIAGNOSIS — Z681 Body mass index (BMI) 19 or less, adult: Secondary | ICD-10-CM | POA: Diagnosis not present

## 2020-11-28 DIAGNOSIS — E43 Unspecified severe protein-calorie malnutrition: Secondary | ICD-10-CM | POA: Diagnosis present

## 2020-11-28 DIAGNOSIS — I2102 ST elevation (STEMI) myocardial infarction involving left anterior descending coronary artery: Principal | ICD-10-CM | POA: Diagnosis present

## 2020-11-28 DIAGNOSIS — Z79899 Other long term (current) drug therapy: Secondary | ICD-10-CM

## 2020-11-28 DIAGNOSIS — I255 Ischemic cardiomyopathy: Secondary | ICD-10-CM | POA: Diagnosis present

## 2020-11-28 DIAGNOSIS — I10 Essential (primary) hypertension: Secondary | ICD-10-CM | POA: Diagnosis present

## 2020-11-28 DIAGNOSIS — Z87891 Personal history of nicotine dependence: Secondary | ICD-10-CM | POA: Diagnosis not present

## 2020-11-28 DIAGNOSIS — I2510 Atherosclerotic heart disease of native coronary artery without angina pectoris: Secondary | ICD-10-CM | POA: Diagnosis present

## 2020-11-28 DIAGNOSIS — R54 Age-related physical debility: Secondary | ICD-10-CM | POA: Diagnosis present

## 2020-11-28 DIAGNOSIS — Z8249 Family history of ischemic heart disease and other diseases of the circulatory system: Secondary | ICD-10-CM

## 2020-11-28 DIAGNOSIS — I340 Nonrheumatic mitral (valve) insufficiency: Secondary | ICD-10-CM | POA: Diagnosis present

## 2020-11-28 DIAGNOSIS — Z8673 Personal history of transient ischemic attack (TIA), and cerebral infarction without residual deficits: Secondary | ICD-10-CM

## 2020-11-28 DIAGNOSIS — Z7982 Long term (current) use of aspirin: Secondary | ICD-10-CM

## 2020-11-28 DIAGNOSIS — J449 Chronic obstructive pulmonary disease, unspecified: Secondary | ICD-10-CM | POA: Diagnosis present

## 2020-11-28 HISTORY — PX: CORONARY/GRAFT ACUTE MI REVASCULARIZATION: CATH118305

## 2020-11-28 HISTORY — PX: LEFT HEART CATH AND CORONARY ANGIOGRAPHY: CATH118249

## 2020-11-28 LAB — CBC
HCT: 46.7 % (ref 39.0–52.0)
Hemoglobin: 15.7 g/dL (ref 13.0–17.0)
MCH: 31.2 pg (ref 26.0–34.0)
MCHC: 33.6 g/dL (ref 30.0–36.0)
MCV: 92.8 fL (ref 80.0–100.0)
Platelets: 353 10*3/uL (ref 150–400)
RBC: 5.03 MIL/uL (ref 4.22–5.81)
RDW: 13.9 % (ref 11.5–15.5)
WBC: 13.3 10*3/uL — ABNORMAL HIGH (ref 4.0–10.5)
nRBC: 0 % (ref 0.0–0.2)

## 2020-11-28 LAB — ECHOCARDIOGRAM COMPLETE
AR max vel: 2.9 cm2
AV Peak grad: 3.6 mmHg
Ao pk vel: 0.95 m/s
Area-P 1/2: 4.19 cm2
Calc EF: 54.4 %
Height: 69 in
S' Lateral: 3.37 cm
Single Plane A2C EF: 50.9 %
Single Plane A4C EF: 51 %
Weight: 1957.68 oz

## 2020-11-28 LAB — GLUCOSE, CAPILLARY
Glucose-Capillary: 110 mg/dL — ABNORMAL HIGH (ref 70–99)
Glucose-Capillary: 94 mg/dL (ref 70–99)

## 2020-11-28 LAB — BASIC METABOLIC PANEL
Anion gap: 13 (ref 5–15)
BUN: 15 mg/dL (ref 8–23)
CO2: 20 mmol/L — ABNORMAL LOW (ref 22–32)
Calcium: 9 mg/dL (ref 8.9–10.3)
Chloride: 108 mmol/L (ref 98–111)
Creatinine, Ser: 1.16 mg/dL (ref 0.61–1.24)
GFR, Estimated: >60 mL/min (ref >60)
Glucose, Bld: 181 mg/dL — ABNORMAL HIGH (ref 70–99)
Potassium: 4.1 mmol/L (ref 3.5–5.1)
Sodium: 141 mmol/L (ref 135–145)

## 2020-11-28 LAB — RESP PANEL BY RT-PCR (FLU A&B, COVID) ARPGX2
Influenza A by PCR: NEGATIVE
Influenza B by PCR: NEGATIVE
SARS Coronavirus 2 by RT PCR: NEGATIVE

## 2020-11-28 LAB — MRSA PCR SCREENING: MRSA by PCR: NEGATIVE

## 2020-11-28 LAB — TROPONIN I (HIGH SENSITIVITY)
Troponin I (High Sensitivity): 22 ng/L — ABNORMAL HIGH (ref <18)
Troponin I (High Sensitivity): >24000 ng/L (ref <18)
Troponin I (High Sensitivity): >24000 ng/L (ref <18)

## 2020-11-28 LAB — SAMPLE TO BLOOD BANK

## 2020-11-28 LAB — POCT ACTIVATED CLOTTING TIME: Activated Clotting Time: 434 seconds

## 2020-11-28 SURGERY — CORONARY/GRAFT ACUTE MI REVASCULARIZATION
Anesthesia: Moderate Sedation

## 2020-11-28 MED ORDER — HEPARIN (PORCINE) IN NACL 2000-0.9 UNIT/L-% IV SOLN
INTRAVENOUS | Status: DC | PRN
  Administered 2020-11-28: 1000 mL

## 2020-11-28 MED ORDER — SODIUM CHLORIDE 0.9 % IV SOLN: Freq: Once | INTRAVENOUS | Status: AC

## 2020-11-28 MED ORDER — MIDAZOLAM HCL 2 MG/2ML IJ SOLN
INTRAMUSCULAR | Status: DC | PRN
  Administered 2020-11-28: 0.5 mg via INTRAVENOUS

## 2020-11-28 MED ORDER — ASPIRIN EC 81 MG PO TBEC
81.0000 mg | DELAYED_RELEASE_TABLET | Freq: Every day | ORAL | Status: DC
  Administered 2020-11-29 – 2020-11-30 (×2): 81 mg via ORAL
  Filled 2020-11-28 (×2): qty 1

## 2020-11-28 MED ORDER — FENTANYL CITRATE (PF) 100 MCG/2ML IJ SOLN
INTRAMUSCULAR | Status: DC | PRN
  Administered 2020-11-28: 25 ug via INTRAVENOUS

## 2020-11-28 MED ORDER — METOPROLOL TARTRATE 50 MG PO TABS
25.0000 mg | ORAL_TABLET | Freq: Two times a day (BID) | ORAL | Status: DC
  Administered 2020-11-28: 25 mg via ORAL
  Filled 2020-11-28: qty 1

## 2020-11-28 MED ORDER — FAMOTIDINE 20 MG PO TABS
20.0000 mg | ORAL_TABLET | Freq: Every day | ORAL | Status: DC
  Administered 2020-11-28 – 2020-11-30 (×2): 20 mg via ORAL
  Filled 2020-11-28 (×2): qty 1

## 2020-11-28 MED ORDER — LIDOCAINE HCL (PF) 1 % IJ SOLN
INTRAMUSCULAR | Status: DC | PRN
Start: 2020-11-28 — End: 2020-11-28
  Administered 2020-11-28: 2 mL

## 2020-11-28 MED ORDER — SODIUM CHLORIDE 0.9 % IV SOLN: 250.0000 mL | INTRAVENOUS | Status: DC | PRN

## 2020-11-28 MED ORDER — ASPIRIN 300 MG RE SUPP
300.0000 mg | RECTAL | Status: AC
  Filled 2020-11-28: qty 1

## 2020-11-28 MED ORDER — ASPIRIN 81 MG PO CHEW: 81.0000 mg | CHEWABLE_TABLET | Freq: Every day | ORAL | Status: DC

## 2020-11-28 MED ORDER — ATORVASTATIN CALCIUM 80 MG PO TABS
80.0000 mg | ORAL_TABLET | Freq: Every day | ORAL | Status: DC
  Administered 2020-11-28: 80 mg via ORAL
  Filled 2020-11-28 (×2): qty 1
  Filled 2020-11-28: qty 4

## 2020-11-28 MED ORDER — SODIUM CHLORIDE 0.9% FLUSH: 3.0000 mL | INTRAVENOUS | Status: DC | PRN

## 2020-11-28 MED ORDER — TICAGRELOR 90 MG PO TABS
90.0000 mg | ORAL_TABLET | Freq: Two times a day (BID) | ORAL | Status: DC
  Administered 2020-11-28 – 2020-11-30 (×5): 90 mg via ORAL
  Filled 2020-11-28 (×5): qty 1

## 2020-11-28 MED ORDER — LIDOCAINE HCL (PF) 1 % IJ SOLN
INTRAMUSCULAR | Status: AC
  Filled 2020-11-28: qty 30

## 2020-11-28 MED ORDER — SODIUM CHLORIDE 0.9 % WEIGHT BASED INFUSION
1.0000 mL/kg/h | INTRAVENOUS | Status: AC
  Administered 2020-11-28: 1 mL/kg/h via INTRAVENOUS

## 2020-11-28 MED ORDER — HEPARIN SODIUM (PORCINE) 5000 UNIT/ML IJ SOLN
60.0000 [IU]/kg | Freq: Once | INTRAMUSCULAR | Status: AC
  Administered 2020-11-28: 4000 [IU] via INTRAVENOUS

## 2020-11-28 MED ORDER — TICAGRELOR 90 MG PO TABS
ORAL_TABLET | ORAL | Status: AC
  Filled 2020-11-28: qty 2

## 2020-11-28 MED ORDER — HEPARIN (PORCINE) 25000 UT/250ML-% IV SOLN: 650.0000 [IU]/h | INTRAVENOUS | Status: DC

## 2020-11-28 MED ORDER — HEPARIN SODIUM (PORCINE) 1000 UNIT/ML IJ SOLN
INTRAMUSCULAR | Status: DC | PRN
  Administered 2020-11-28: 7000 [IU] via INTRAVENOUS

## 2020-11-28 MED ORDER — LABETALOL HCL 5 MG/ML IV SOLN: 10.0000 mg | INTRAVENOUS | Status: AC | PRN

## 2020-11-28 MED ORDER — ACETAMINOPHEN 325 MG PO TABS: 650.0000 mg | ORAL_TABLET | ORAL | Status: DC | PRN

## 2020-11-28 MED ORDER — MIDAZOLAM HCL 2 MG/2ML IJ SOLN
INTRAMUSCULAR | Status: AC
  Filled 2020-11-28: qty 2

## 2020-11-28 MED ORDER — TICAGRELOR 90 MG PO TABS
ORAL_TABLET | ORAL | Status: DC | PRN
Start: 2020-11-28 — End: 2020-11-28
  Administered 2020-11-28: 180 mg via ORAL

## 2020-11-28 MED ORDER — VERAPAMIL HCL 2.5 MG/ML IV SOLN
INTRAVENOUS | Status: DC | PRN
Start: 2020-11-28 — End: 2020-11-28
  Administered 2020-11-28: 2.5 mg via INTRAVENOUS

## 2020-11-28 MED ORDER — ASPIRIN 81 MG PO CHEW
324.0000 mg | CHEWABLE_TABLET | ORAL | Status: AC
  Administered 2020-11-28: 324 mg via ORAL
  Filled 2020-11-28: qty 4

## 2020-11-28 MED ORDER — SODIUM CHLORIDE 0.9 % IV SOLN
INTRAVENOUS | Status: AC | PRN
  Administered 2020-11-28: 250 mL via INTRAVENOUS

## 2020-11-28 MED ORDER — HYDRALAZINE HCL 20 MG/ML IJ SOLN: 10.0000 mg | INTRAMUSCULAR | Status: AC | PRN

## 2020-11-28 MED ORDER — NITROGLYCERIN 0.4 MG SL SUBL: 0.4000 mg | SUBLINGUAL_TABLET | SUBLINGUAL | Status: DC | PRN

## 2020-11-28 MED ORDER — ONDANSETRON HCL 4 MG/2ML IJ SOLN
4.0000 mg | Freq: Four times a day (QID) | INTRAMUSCULAR | Status: DC | PRN
  Administered 2020-11-28: 4 mg via INTRAVENOUS
  Filled 2020-11-28: qty 2

## 2020-11-28 MED ORDER — SODIUM CHLORIDE 0.9% FLUSH: 3.0000 mL | Freq: Two times a day (BID) | INTRAVENOUS | Status: DC

## 2020-11-28 MED ORDER — PRASUGREL HCL 10 MG PO TABS: 10.0000 mg | ORAL_TABLET | Freq: Every day | ORAL | Status: DC

## 2020-11-28 MED ORDER — HEPARIN (PORCINE) IN NACL 1000-0.9 UT/500ML-% IV SOLN
INTRAVENOUS | Status: AC
  Filled 2020-11-28: qty 1000

## 2020-11-28 MED ORDER — ENOXAPARIN SODIUM 40 MG/0.4ML IJ SOSY
40.0000 mg | PREFILLED_SYRINGE | INTRAMUSCULAR | Status: DC
  Administered 2020-11-28 – 2020-11-30 (×2): 40 mg via SUBCUTANEOUS
  Filled 2020-11-28 (×2): qty 0.4

## 2020-11-28 MED ORDER — SODIUM CHLORIDE 0.9% FLUSH
3.0000 mL | Freq: Two times a day (BID) | INTRAVENOUS | Status: DC
  Administered 2020-11-28 – 2020-11-30 (×4): 3 mL via INTRAVENOUS

## 2020-11-28 MED ORDER — ONDANSETRON HCL 4 MG/2ML IJ SOLN: 4.0000 mg | Freq: Four times a day (QID) | INTRAMUSCULAR | Status: DC | PRN

## 2020-11-28 MED ORDER — FENTANYL CITRATE (PF) 100 MCG/2ML IJ SOLN
INTRAMUSCULAR | Status: AC
  Filled 2020-11-28: qty 2

## 2020-11-28 MED ORDER — FENTANYL CITRATE (PF) 100 MCG/2ML IJ SOLN
INTRAMUSCULAR | Status: AC
  Administered 2020-11-28: 50 ug
  Filled 2020-11-28: qty 2

## 2020-11-28 MED ORDER — VERAPAMIL HCL 2.5 MG/ML IV SOLN
INTRAVENOUS | Status: AC
  Filled 2020-11-28: qty 2

## 2020-11-28 MED ORDER — HEPARIN SODIUM (PORCINE) 1000 UNIT/ML IJ SOLN
INTRAMUSCULAR | Status: AC
  Filled 2020-11-28: qty 1

## 2020-11-28 SURGICAL SUPPLY — 19 items
BALLN TREK RX 2.5X15 (BALLOONS) ×2
BALLOON TREK RX 2.5X15 (BALLOONS) IMPLANT
CATH INFINITI 5FR ANG PIGTAIL (CATHETERS) ×1 IMPLANT
CATH INFINITI JR4 5F (CATHETERS) ×1 IMPLANT
CATH VISTA GUIDE 6FR JL3.5 (CATHETERS) ×1 IMPLANT
DEVICE RAD TR BAND REGULAR (VASCULAR PRODUCTS) ×1 IMPLANT
GLIDESHEATH SLEND SS 6F .021 (SHEATH) ×1 IMPLANT
GUIDEWIRE INQWIRE 1.5J.035X260 (WIRE) IMPLANT
INQWIRE 1.5J .035X260CM (WIRE) ×2
KIT ENCORE 26 ADVANTAGE (KITS) ×1 IMPLANT
KIT SYRINGE INJ CVI SPIKEX1 (MISCELLANEOUS) ×1 IMPLANT
PACK CARDIAC CATH (CUSTOM PROCEDURE TRAY) ×2 IMPLANT
PROTECTION STATION PRESSURIZED (MISCELLANEOUS) ×2
SET ATX SIMPLICITY (MISCELLANEOUS) ×1 IMPLANT
STATION PROTECTION PRESSURIZED (MISCELLANEOUS) IMPLANT
STENT RESOLUTE ONYX 2.75X18 (Permanent Stent) ×1 IMPLANT
TUBING CIL FLEX 10 FLL-RA (TUBING) ×1 IMPLANT
WIRE ASAHI PROWATER 180CM (WIRE) ×1 IMPLANT
WIRE G HI TQ BMW 190 (WIRE) ×1 IMPLANT

## 2020-11-28 NOTE — ED Notes (Signed)
Pt transported to Cath Lab

## 2020-11-28 NOTE — Progress Notes (Signed)
Pt BP and HR dropped. C/O of "not feeling well and having nausea. Zofran given, MD notified, EKG 12, CBG are done per MD, 1L bolus NS is infusing. Will reevaluate

## 2020-11-28 NOTE — ED Notes (Signed)
Dr. Darrold Junker in room to assess pt.

## 2020-11-28 NOTE — Progress Notes (Signed)
Notified by RN that patient complains of not feeling well and having nausea. Patient is seen and examined at bedside he complains of feeling very nauseous and having generalized pain but denies having any chest pain. VS - HR 44 - 52, SBP Lungs -bilateral air entry CVS -bradycardic Abd - Bs +, soft, non tender, non distended Ext - No swelling Plan Anti emetics 1L 0.9% fluid bolus Hold Metoprolol for now Cycle cardiac enzymes

## 2020-11-28 NOTE — Consult Note (Signed)
[facility] Cardiology  CARDIOLOGY CONSULT NOTE  Patient ID: BOYDE GRIECO MRN: [id number] DOB/AGE: 07/30/1960 60 y.o.  Admit date: 11/28/2020 Referring Physician [facility] Primary Physician  Primary Cardiologist  Reason for Consultation anterior STEMI  HPI: 60 year old gentleman referred for anterior STEMI.  Patient was in usual state of health until earlier this morning when he awoke with 10 out of 10 bilateral arm pain.  He was brought to [facility] ED where ECG revealed ST elevations in leads V2 through V6.  The patient is on no medications.  He does have a history of tobacco abuse.  The patient was brought urgently to the cardiac catheterization laboratory.  Coronary angiography revealed high-grade 99% thrombotic stenosis in the proximal/mid LAD and 75% stenosis OM 3.  Left ventriculography revealed mildly reduced left ventricular function with anteroapical akinesis, and moderate mitral regurgitation.  The patient underwent PCI, receiving 2.75 x 15 mm Resolute Onyx drug-eluting stent proximal/mid LAD with resolution of chest pain.  Review of systems complete and found to be negative unless listed above     Past Medical History:  Diagnosis Date  . Asthma   . COPD (chronic obstructive pulmonary disease) (HCC)     History reviewed. No pertinent surgical history.  Medications Prior to Admission  Medication Sig Dispense Refill Last Dose  . amLODipine (NORVASC) 5 MG tablet Take 1 tablet (5 mg total) by mouth daily. 30 tablet 0   . aspirin EC 81 MG EC tablet Take 1 tablet (81 mg total) by mouth daily. Swallow whole. 30 tablet 11   . atorvastatin (LIPITOR) 40 MG tablet Take 1 tablet (40 mg total) by mouth daily. 30 tablet 0   . diphenhydrAMINE (BENADRYL) 12.5 MG/5ML elixir Take 5 mLs (12.5 mg total) by mouth every 6 (six) hours for 2 days. Can continue to take as needed for headache after 2 days.  Limit to 3-4 doses a week 40 mL 0   . famotidine (PEPCID) 20 MG tablet Take 1 tablet (20 mg total) by  mouth 2 (two) times daily. 60 tablet 0   . loperamide (IMODIUM A-D) 2 MG tablet Take 2 tablets (4 mg total) by mouth 4 (four) times daily as needed for diarrhea or loose stools. 30 tablet 0   . ondansetron (ZOFRAN ODT) 4 MG disintegrating tablet Take 1 tablet (4 mg total) by mouth every 8 (eight) hours as needed for nausea or vomiting. 20 tablet 0    Social History   Socioeconomic History  . Marital status: Single    Spouse name: Not on file  . Number of children: Not on file  . Years of education: Not on file  . Highest education level: Not on file  Occupational History  . Not on file  Tobacco Use  . Smoking status: Former Smoker    Types: Cigarettes    Quit date: 2020    Years since quitting: 2.4  . Smokeless tobacco: Never Used  Vaping Use  . Vaping Use: Former  Substance and Sexual Activity  . Alcohol use: Yes    Comment: "i did all night I wish I hadn't"  . Drug use: Yes    Types: Marijuana    Comment: daily  . Sexual activity: Not on file  Other Topics Concern  . Not on file  Social History Narrative  . Not on file   Social Determinants of Health   Financial Resource Strain: Not on file  Food Insecurity: Not on file  Transportation Needs: Not on file  Physical Activity:  Not on file  Stress: Not on file  Social Connections: Not on file  Intimate Partner Violence: Not on file    No family history on file.    Review of systems complete and found to be negative unless listed above      PHYSICAL EXAM  General: Well developed, well nourished, in no acute distress HEENT:  Normocephalic and atramatic Neck:  No JVD.  Lungs: Clear bilaterally to auscultation and percussion. Heart: HRRR . Normal S1 and S2 without gallops or murmurs.  Abdomen: Bowel sounds are positive, abdomen soft and non-tender  Msk:  Back normal, normal gait. Normal strength and tone for age. Extremities: No clubbing, cyanosis or edema.   Neuro: Alert and oriented X 3. Psych:  Good affect,  responds appropriately  Labs:   Lab Results  Component Value Date   WBC 13.3 (H) 11/28/2020   HGB 15.7 11/28/2020   HCT 46.7 11/28/2020   MCV 92.8 11/28/2020   PLT 353 11/28/2020    Recent Labs  Lab 11/28/20 0521  NA 141  K 4.1  CL 108  CO2 20*  BUN 15  CREATININE 1.16  CALCIUM 9.0  GLUCOSE 181*   Lab Results  Component Value Date   CKTOTAL 287 05/17/2020    Lab Results  Component Value Date   CHOL 148 05/16/2020   Lab Results  Component Value Date   HDL 40 (L) 05/16/2020   Lab Results  Component Value Date   LDLCALC 84 05/16/2020   Lab Results  Component Value Date   TRIG 118 05/16/2020   Lab Results  Component Value Date   CHOLHDL 3.7 05/16/2020   No results found for: LDLDIRECT    Radiology: CARDIAC CATHETERIZATION  Result Date: 11/28/2020  Prox RCA-1 lesion is 30% stenosed.  Prox RCA-2 lesion is 20% stenosed.  3rd Mrg lesion is 75% stenosed.  Prox LAD to Mid LAD lesion is 99% stenosed.  A drug-eluting stent was successfully placed using a STENT RESOLUTE ONYX [id number].  Post intervention, there is a 0% residual stenosis.  There is moderate (3+) mitral regurgitation.  1.  Anterior STEMI 2.  Two-vessel coronary artery disease with ulcerated 99% stenosis proximal/mid LAD and 75% stenosis OM 3 3.  Mildly reduced left ventricular function with anteroapical akinesis 4.  Moderate to severe mitral regurgitation 5.  Successful primary PCI with DES proximal/mid LAD Recommendations 1.  Dual antiplatelet therapy uninterrupted for 1 year 2.  High intensity atorvastatin 80 mg daily 3.  Metoprolol tartrate 25 mg twice daily 4.  2D echocardiogram   DG Chest Portable 1 View  Result Date: 11/28/2020 CLINICAL DATA:  ST elevation myocardial infarction EXAM: PORTABLE CHEST 1 VIEW COMPARISON:  05/15/2020 FINDINGS: Hyperinflation and interstitial coarsening with biapical architectural distortion. There is hazy density bilaterally, nodule not excluded. Artifact from EKG leads  and defibrillator pads. Normal heart size and stable aortic contours. IMPRESSION: 1. Recommend follow-up chest CT to evaluate hazy density at the apices. 2. Emphysema. 3. No acute finding related to the history of myocardial infarction. Electronically Signed   By: Marnee Spring M.D.   On: 11/28/2020 05:19    EKG: Sinus rhythm with ST elevation in leads V2 through V6  ASSESSMENT AND PLAN:   1.  Anterior STEMI 2.  Successful primary PCI with DES proximal/mid LAD 3.  Mildly reduced left ventricular function with anterior apical akinesis 4.  Moderate mitral regurgitation  Recommendations  1.  Dual antiplatelet therapy uninterrupted for 1 year 2.  Start atorvastatin  80 mg daily 3.  Start metoprolol tartrate 25 mg twice daily 4.  2D echocardiogram  Signed: Marcina Millard MD,PhD, [facility] 11/28/2020, 6:51 AM

## 2020-11-28 NOTE — H&P (Signed)
History and Physical    Peter Page SAY:[id number] DOB: [birth year] DOA: 11/28/2020  PCP: Patient, No Pcp Per (Inactive)   Patient coming from: Home  I have personally briefly reviewed patient's old medical records in [facility]  Chief Complaint: Chest pain  HPI: Peter Page is a 60 y.o. male with medical history significant for CVA, hypertension, COPD, nicotine dependence who presents to the ER for evaluation of bilateral arm pain as well as pain over the left anterior chest wall.  Patient describes his pain as severe and rated at 10 x 10 in intensity at its worst.  Pain woke him up from sleep and was associated with nausea, vomiting, shortness of breath and palpitations.  He has never had any pain like this before.  He does have a family history of coronary artery disease. EMS was called and code STEMI was activated in the field as twelve-lead EKG showed ST elevations in the anterior lateral leads. He denied having any abdominal pain, no changes in his bowel habits, no fever, no chills, no cough, no dizziness, no lightheadedness, no diaphoresis, no headache, no blurred vision, no focal deficits, no urinary symptoms. Labs show sodium 141, potassium 4.1, chloride 108, bicarb 20, glucose 181, BUN 15, creatinine 1.16, calcium 9.0, troponin 22, white count 13.3, hemoglobin 15.7, hematocrit 46.7, MCV 92.8, RDW 13.9, platelet count 353. Respiratory viral panel is negative Chest x-ray reviewed by me shows hyperinflated lung fields. Twelve-lead EKG reviewed by me shows ST elevations in leads V2 to V6.    ED Course: Patient is a 60 year old male with a history of hypertension and CVA who presents to the ER via EMS as a code STEMI. Patient received IV heparin, aspirin, IV fentanyl and IV fluids for relative hypotension in the ER and was taken emergently to the Cath Lab for cardiac catheterization.  Review of Systems: As per HPI otherwise all other systems reviewed and negative.     Past Medical History:  Diagnosis Date  . Asthma   . COPD (chronic obstructive pulmonary disease) (HCC)     History reviewed. No pertinent surgical history.   reports that he quit smoking about 2 years ago. His smoking use included cigarettes. He has never used smokeless tobacco. He reports current alcohol use. He reports current drug use. Drug: Marijuana.  No Known Allergies  Family History  Problem Relation Age of Onset  . CAD Father       Prior to Admission medications   Medication Sig Start Date End Date Taking? Authorizing Provider  amLODipine (NORVASC) 5 MG tablet Take 1 tablet (5 mg total) by mouth daily. 05/20/20 06/19/20  Tresa Moore, MD  aspirin EC 81 MG EC tablet Take 1 tablet (81 mg total) by mouth daily. Swallow whole. 05/20/20   Tresa Moore, MD  atorvastatin (LIPITOR) 40 MG tablet Take 1 tablet (40 mg total) by mouth daily. 05/20/20 06/19/20  Tresa Moore, MD  diphenhydrAMINE (BENADRYL) 12.5 MG/5ML elixir Take 5 mLs (12.5 mg total) by mouth every 6 (six) hours for 2 days. Can continue to take as needed for headache after 2 days.  Limit to 3-4 doses a week 05/19/20 05/21/20  Lolita Patella B, MD  famotidine (PEPCID) 20 MG tablet Take 1 tablet (20 mg total) by mouth 2 (two) times daily. 09/04/17   Sharman Cheek, MD  loperamide (IMODIUM A-D) 2 MG tablet Take 2 tablets (4 mg total) by mouth 4 (four) times daily as needed for diarrhea or loose stools.  09/04/17   Sharman Cheek, MD  ondansetron (ZOFRAN ODT) 4 MG disintegrating tablet Take 1 tablet (4 mg total) by mouth every 8 (eight) hours as needed for nausea or vomiting. 09/04/17   Sharman Cheek, MD    Physical Exam: Vitals:   11/28/20 0515 11/28/20 0539 11/28/20 0700 11/28/20 0800  BP:   (!) 88/68 (!) 83/70  Pulse:    90  Resp:   16 (!) 21  Temp:    98.7 F (37.1 C)  TempSrc:    Oral  SpO2:  100%  100%  Weight: 55.5 kg     Height: 5\' 9"  (1.753 m)        Vitals:   11/28/20  0515 11/28/20 0539 11/28/20 0700 11/28/20 0800  BP:   (!) 88/68 (!) 83/70  Pulse:    90  Resp:   16 (!) 21  Temp:    98.7 F (37.1 C)  TempSrc:    Oral  SpO2:  100%  100%  Weight: 55.5 kg     Height: 5\' 9"  (1.753 m)         Constitutional: Alert and oriented x 3 . Not in any apparent distress.  Thin and frail HEENT:      Head: Normocephalic and atraumatic.         Eyes: PERLA, EOMI, Conjunctivae are normal. Sclera is non-icteric.       Mouth/Throat: Mucous membranes are moist.       Neck: Supple with no signs of meningismus. Cardiovascular: Regular rate and rhythm. No murmurs, gallops, or rubs. 2+ symmetrical distal pulses are present . No JVD. No LE edema Respiratory: Respiratory effort normal .Lungs sounds clear bilaterally. No wheezes, crackles, or rhonchi.  Gastrointestinal: Soft, non tender, and non distended with positive bowel sounds.  Genitourinary: No CVA tenderness. Musculoskeletal: Nontender with normal range of motion in all extremities. No cyanosis, or erythema of extremities. Neurologic:  Face is symmetric. Moving all extremities. No gross focal neurologic deficits . Skin: Skin is warm, dry.  No rash or ulcers Psychiatric: Mood and affect are normal   Labs on Admission: I have personally reviewed following labs and imaging studies  CBC: Recent Labs  Lab 11/28/20 0521  WBC 13.3*  HGB 15.7  HCT 46.7  MCV 92.8  PLT 353   Basic Metabolic Panel: Recent Labs  Lab 11/28/20 0521  NA 141  K 4.1  CL 108  CO2 20*  GLUCOSE 181*  BUN 15  CREATININE 1.16  CALCIUM 9.0   GFR: Estimated Creatinine Clearance: 52.5 mL/min (by C-G formula based on SCr of 1.16 mg/dL). Liver Function Tests: No results for input(s): AST, ALT, ALKPHOS, BILITOT, PROT, ALBUMIN in the last 168 hours. No results for input(s): LIPASE, AMYLASE in the last 168 hours. No results for input(s): AMMONIA in the last 168 hours. Coagulation Profile: No results for input(s): INR, PROTIME in the  last 168 hours. Cardiac Enzymes: No results for input(s): CKTOTAL, CKMB, CKMBINDEX, TROPONINI in the last 168 hours. BNP (last 3 results) No results for input(s): PROBNP in the last 8760 hours. HbA1C: No results for input(s): HGBA1C in the last 72 hours. CBG: Recent Labs  Lab 11/28/20 0654  GLUCAP 110*   Lipid Profile: No results for input(s): CHOL, HDL, LDLCALC, TRIG, CHOLHDL, LDLDIRECT in the last 72 hours. Thyroid Function Tests: No results for input(s): TSH, T4TOTAL, FREET4, T3FREE, THYROIDAB in the last 72 hours. Anemia Panel: No results for input(s): VITAMINB12, FOLATE, FERRITIN, TIBC, IRON, RETICCTPCT in the last 72  hours. Urine analysis:    Component Value Date/Time   COLORURINE YELLOW (A) 05/16/2020 0235   APPEARANCEUR HAZY (A) 05/16/2020 0235   LABSPEC 1.028 05/16/2020 0235   PHURINE 6.0 05/16/2020 0235   GLUCOSEU NEGATIVE 05/16/2020 0235   HGBUR NEGATIVE 05/16/2020 0235   BILIRUBINUR NEGATIVE 05/16/2020 0235   KETONESUR NEGATIVE 05/16/2020 0235   PROTEINUR NEGATIVE 05/16/2020 0235   NITRITE NEGATIVE 05/16/2020 0235   LEUKOCYTESUR NEGATIVE 05/16/2020 0235    Radiological Exams on Admission: CARDIAC CATHETERIZATION  Result Date: 11/28/2020  Prox RCA-1 lesion is 30% stenosed.  Prox RCA-2 lesion is 20% stenosed.  3rd Mrg lesion is 75% stenosed.  Prox LAD to Mid LAD lesion is 99% stenosed.  A drug-eluting stent was successfully placed using a STENT RESOLUTE ONYX [id number].  Post intervention, there is a 0% residual stenosis.  There is moderate (3+) mitral regurgitation.  1.  Anterior STEMI 2.  Two-vessel coronary artery disease with ulcerated 99% stenosis proximal/mid LAD and 75% stenosis OM 3 3.  Mildly reduced left ventricular function with anteroapical akinesis 4.  Moderate to severe mitral regurgitation 5.  Successful primary PCI with DES proximal/mid LAD Recommendations 1.  Dual antiplatelet therapy uninterrupted for 1 year 2.  High intensity atorvastatin 80 mg  daily 3.  Metoprolol tartrate 25 mg twice daily 4.  2D echocardiogram   DG Chest Portable 1 View  Result Date: 11/28/2020 CLINICAL DATA:  ST elevation myocardial infarction EXAM: PORTABLE CHEST 1 VIEW COMPARISON:  05/15/2020 FINDINGS: Hyperinflation and interstitial coarsening with biapical architectural distortion. There is hazy density bilaterally, nodule not excluded. Artifact from EKG leads and defibrillator pads. Normal heart size and stable aortic contours. IMPRESSION: 1. Recommend follow-up chest CT to evaluate hazy density at the apices. 2. Emphysema. 3. No acute finding related to the history of myocardial infarction. Electronically Signed   By: Marnee Spring M.D.   On: 11/28/2020 05:19     Assessment/Plan Principal Problem:   Acute ST elevation myocardial infarction (STEMI) involving left anterior descending (LAD) coronary artery (HCC) Active Problems:   History of tobacco use   Chronic obstructive pulmonary disease (HCC)   Essential hypertension     Acute ST elevation MI Patient presented to the ER as a code STEMI and was taken emergently to the Cath Lab. Coronary angiography revealed high-grade 99% thrombotic stenosis in the proximal/mid LAD and 75% stenosis OM 3.  Left ventriculography revealed mildly reduced LV function with anterior apical akinesis and moderate mitral regurgitation. Patient underwent PCI and had a drug-eluting stent placed in the proximal/mid LAD with resolution of his pain We will place patient on aspirin and Brilinta as well as high intensity statins and beta-blockers.     History of tobacco use Patient states that he quit smoking 3 years ago and only smokes cigars He declines a nicotine transdermal patch at this time    COPD Not acutely exacerbated Stable Patient will need a CT scan as an outpatient to evaluate hazy density at the apices.  DVT prophylaxis: Lovenox Code Status: full code  Family Communication: Greater than 50% of time was  spent discussing plan of care with patient at the bedside.  All questions and concerns have been addressed.  He verbalizes understanding and agrees with the plan. Disposition Plan: Back to previous home environment Consults called: Cardiology Status: At the time of admission, it appears that the appropriate admission status for this patient is inpatient. This is judged to be reasonable and necessary in order to provide the required  intensity of service to ensure the patient's safety given the presenting symptoms, physical exam findings and initial radiographic and laboratory data in the context of their comorbid conditions. Patient requires inpatient status due to high intensity of service, high risk for further deterioration and high frequency of surveillance required.    Lucile Shuttersochukwu Estefana Taylor MD [facility]     11/28/2020, 9:13 AM

## 2020-11-28 NOTE — Progress Notes (Signed)
ANTICOAGULATION CONSULT NOTE - Initial Consult  Pharmacy Consult for Heparin  Indication: chest pain/ACS  No Known Allergies  Patient Measurements: Height: 5\' 9"  (175.3 cm) Weight: 55.5 kg (122 lb 5.7 oz) IBW/kg (Calculated) : 70.7 Heparin Dosing Weight: 55.5 kg   Vital Signs: BP: 93/69 (05/30 0511) Pulse Rate: 62 (05/30 0511)  Labs: Recent Labs    11/28/20 0521  HGB 15.7  HCT 46.7  PLT 353  CREATININE 1.16  TROPONINIHS 22*    Estimated Creatinine Clearance: 52.5 mL/min (by C-G formula based on SCr of 1.16 mg/dL).   Medical History: Past Medical History:  Diagnosis Date  . Asthma   . COPD (chronic obstructive pulmonary disease) (HCC)     Medications:  Medications Prior to Admission  Medication Sig Dispense Refill Last Dose  . amLODipine (NORVASC) 5 MG tablet Take 1 tablet (5 mg total) by mouth daily. 30 tablet 0   . aspirin EC 81 MG EC tablet Take 1 tablet (81 mg total) by mouth daily. Swallow whole. 30 tablet 11   . atorvastatin (LIPITOR) 40 MG tablet Take 1 tablet (40 mg total) by mouth daily. 30 tablet 0   . diphenhydrAMINE (BENADRYL) 12.5 MG/5ML elixir Take 5 mLs (12.5 mg total) by mouth every 6 (six) hours for 2 days. Can continue to take as needed for headache after 2 days.  Limit to 3-4 doses a week 40 mL 0   . famotidine (PEPCID) 20 MG tablet Take 1 tablet (20 mg total) by mouth 2 (two) times daily. 60 tablet 0   . loperamide (IMODIUM A-D) 2 MG tablet Take 2 tablets (4 mg total) by mouth 4 (four) times daily as needed for diarrhea or loose stools. 30 tablet 0   . ondansetron (ZOFRAN ODT) 4 MG disintegrating tablet Take 1 tablet (4 mg total) by mouth every 8 (eight) hours as needed for nausea or vomiting. 20 tablet 0     Assessment: Pharmacy consulted to dose heparin in this 60 year old male admitted with STEMI.   CrCl = 52.5 ml/min  No prior anticoag noted  Goal of Therapy:  Heparin level 0.3-0.7 units/ml Monitor platelets by anticoagulation protocol:  Yes   Plan:  Heparin 4000 units IV X 1 given in ED @ 0505. Heparin 7000 units IV X 1 given in ED @ 0700.  Start heparin infusion at 650 units/hr Check anti-Xa level in 6 hours and daily while on heparin Continue to monitor H&H and platelets   Pt is current in cath lab, will f/u with MD about continuing heparin gtt after cath.   Keshana Klemz D 11/28/2020,5:59 AM

## 2020-11-28 NOTE — Progress Notes (Signed)
*  PRELIMINARY RESULTS* Echocardiogram 2D Echocardiogram has been performed.  Peter Page 11/28/2020, 11:16 AM

## 2020-11-28 NOTE — ED Triage Notes (Signed)
Pt brought in by [facility], with STEMI. He is complaining of bilat arm pain and SHOB.

## 2020-11-28 NOTE — ED Notes (Signed)
Decisions made to take pt to Cath Lab.

## 2020-11-28 NOTE — ED Provider Notes (Signed)
[facility] Emergency Department Provider Note  ____________________________________________  Time seen: Approximately 5:20 AM  I have reviewed the triage vital signs and the nursing notes.   HISTORY  Chief Complaint Code STEMI   HPI Peter Page is a 60 y.o. male with history of asthma, COPD, hypertension, CVA who presents for evaluation of bilateral arm pain.  Patient reports sudden onset of severe pain that woke him up from his sleep 30 minutes prior to arrival.  He describes the pain as dull located on both of his arms associated with nausea and dizziness and shortness of breath.  The pain is 10 out of 10.  Patient denies ever having similar pain.  The pain is not sharp, does not radiate to his back, not pleuritic in nature.  No personal or family history of blood clots, recent travel immobilization, leg pain or swelling, hemoptysis or exogenous hormones.  EMS activated code STEMI in the field.  Past Medical History:  Diagnosis Date  . Asthma   . COPD (chronic obstructive pulmonary disease) [facility])     Patient Active Problem List   Diagnosis Date Noted  . Malnutrition of moderate degree 05/17/2020  . Nausea   . Nonintractable headache   . Essential hypertension   . Hypokalemia   . Cerebellar stroke, acute (HCC) 05/16/2020  . History of tobacco use 05/16/2020  . Acute CVA (cerebrovascular accident) (HCC) 05/16/2020  . Hypotension   . Dizziness   . Chronic obstructive pulmonary disease (HCC)   . Subclavian artery stenosis, left (HCC)     History reviewed. No pertinent surgical history.  Prior to Admission medications   Medication Sig Start Date End Date Taking? Authorizing Provider  amLODipine (NORVASC) 5 MG tablet Take 1 tablet (5 mg total) by mouth daily. 05/20/20 06/19/20  Tresa Moore, MD  aspirin EC 81 MG EC tablet Take 1 tablet (81 mg total) by mouth daily. Swallow whole. 05/20/20   Tresa Moore, MD  atorvastatin (LIPITOR)  40 MG tablet Take 1 tablet (40 mg total) by mouth daily. 05/20/20 06/19/20  Tresa Moore, MD  diphenhydrAMINE (BENADRYL) 12.5 MG/5ML elixir Take 5 mLs (12.5 mg total) by mouth every 6 (six) hours for 2 days. Can continue to take as needed for headache after 2 days.  Limit to 3-4 doses a week 05/19/20 05/21/20  Lolita Patella B, MD  famotidine (PEPCID) 20 MG tablet Take 1 tablet (20 mg total) by mouth 2 (two) times daily. 09/04/17   Sharman Cheek, MD  loperamide (IMODIUM A-D) 2 MG tablet Take 2 tablets (4 mg total) by mouth 4 (four) times daily as needed for diarrhea or loose stools. 09/04/17   Sharman Cheek, MD  ondansetron (ZOFRAN ODT) 4 MG disintegrating tablet Take 1 tablet (4 mg total) by mouth every 8 (eight) hours as needed for nausea or vomiting. 09/04/17   Sharman Cheek, MD    Allergies Patient has no known allergies.  No family history on file.  Social History Social History   Tobacco Use  . Smoking status: Former Smoker    Types: Cigarettes    Quit date: 2020    Years since quitting: 2.4  . Smokeless tobacco: Never Used  Vaping Use  . Vaping Use: Former  Substance Use Topics  . Alcohol use: Yes    Comment: "i did all night I wish I hadn't"  . Drug use: Yes    Types: Marijuana    Comment: daily    Review of Systems  Constitutional: Negative for fever. Eyes: Negative for visual changes. ENT: Negative for sore throat. Neck: No neck pain  Cardiovascular: Negative for chest pain. Respiratory: + shortness of breath. Gastrointestinal: Negative for abdominal pain, vomiting or diarrhea. Genitourinary: Negative for dysuria. Musculoskeletal: Negative for back pain. + b/l arm pain Skin: Negative for rash. Neurological: Negative for headaches, weakness or numbness. Psych: No SI or HI  ____________________________________________   PHYSICAL EXAM:  VITAL SIGNS: ED Triage Vitals  Enc Vitals Group     BP 11/28/20 0511 93/69     Pulse Rate 11/28/20 0511  62     Resp 11/28/20 0511 (!) 28     Temp --      Temp src --      SpO2 11/28/20 0511 98 %     Weight 11/28/20 0515 122 lb 5.7 oz (55.5 kg)     Height 11/28/20 0515 5\' 9"  (1.753 m)     Head Circumference --      Peak Flow --      Pain Score 11/28/20 0517 10     Pain Loc --      Pain Edu? --      Excl. in GC? --     Constitutional: Alert and oriented, moderate distress due to pain.  HEENT:      Head: Normocephalic and atraumatic.         Eyes: Conjunctivae are normal. Sclera is non-icteric.       Mouth/Throat: Mucous membranes are moist.       Neck: Supple with no signs of meningismus. Cardiovascular: Regular rate and rhythm. No murmurs, gallops, or rubs. 2+ symmetrical distal pulses are present in all extremities. No JVD. Respiratory: Normal respiratory effort. Lungs are clear to auscultation bilaterally.  Gastrointestinal: Soft, non tender. Musculoskeletal:  No edema, cyanosis, or erythema of extremities. Neurologic: Normal speech and language. Face is symmetric. Moving all extremities. No gross focal neurologic deficits are appreciated. Skin: Skin is warm, dry and intact. No rash noted. Psychiatric: Mood and affect are normal. Speech and behavior are normal.  ____________________________________________   LABS (all labs ordered are listed, but only abnormal results are displayed)  Labs Reviewed  RESP PANEL BY RT-PCR (FLU A&B, COVID) ARPGX2  BASIC METABOLIC PANEL  CBC  TROPONIN I (HIGH SENSITIVITY)   ____________________________________________  EKG  ED ECG REPORT I, 11/30/20, the attending physician, personally viewed and interpreted this ECG.  Sinus rhythm with rate of 65, mild inferior ST elevations with no ST depressions. ____________________________________________  RADIOLOGY  I have personally reviewed the images performed during this visit and I agree with the Radiologist's read.   Interpretation by Radiologist:  DG Chest Portable 1  View  Result Date: 11/28/2020 CLINICAL DATA:  ST elevation myocardial infarction EXAM: PORTABLE CHEST 1 VIEW COMPARISON:  05/15/2020 FINDINGS: Hyperinflation and interstitial coarsening with biapical architectural distortion. There is hazy density bilaterally, nodule not excluded. Artifact from EKG leads and defibrillator pads. Normal heart size and stable aortic contours. IMPRESSION: 1. Recommend follow-up chest CT to evaluate hazy density at the apices. 2. Emphysema. 3. No acute finding related to the history of myocardial infarction. Electronically Signed   By: 05/17/2020 M.D.   On: 11/28/2020 05:19     ____________________________________________   PROCEDURES  Procedure(s) performed:yes .1-3 Lead EKG Interpretation Performed by: 11/30/2020, MD Authorized by: Nita Sickle, MD     Interpretation: non-specific     ECG rate assessment: normal     Rhythm: sinus rhythm  Ectopy: none     Conduction: normal     Critical Care performed: yes  CRITICAL CARE Performed by: Nita Sickle  ?  Total critical care time: 30 min  Critical care time was exclusive of separately billable procedures and treating other patients.  Critical care was necessary to treat or prevent imminent or life-threatening deterioration.  Critical care was time spent personally by me on the following activities: development of treatment plan with patient and/or surrogate as well as nursing, discussions with consultants, evaluation of patient's response to treatment, examination of patient, obtaining history from patient or surrogate, ordering and performing treatments and interventions, ordering and review of laboratory studies, ordering and review of radiographic studies, pulse oximetry and re-evaluation of patient's condition.  ____________________________________________   INITIAL IMPRESSION / ASSESSMENT AND PLAN / ED COURSE   60 y.o. male with history of asthma, COPD, hypertension,  CVA who presents for evaluation of bilateral arm pain.  Code STEMI activated in the field.  EKG done by EMS showing anterior and lateral ST elevations consistent with STEMI.  Upon arrival to the emergency room then repeat EKG looks markedly improved.  Patient complained of 10 out of 10 pain.  Slightly hypotensive.  IV fluids initiated, patient given 4000 units of IV heparin, full dose aspirin, IV fluids, and IV fentanyl for pain.  Patient seen by Dr. Darrold Junker in the emergency room and taken to the Cath Lab for emergent catheterization.  Per request of patient, I discussed patient's medical status with his daughter over the phone.  Patient left the emergency room in critical but stable condition.  Old medical records reviewed      _____________________________________________ Please note:  Patient was evaluated in Emergency Department today for the symptoms described in the history of present illness. Patient was evaluated in the context of the global COVID-19 pandemic, which necessitated consideration that the patient might be at risk for infection with the SARS-CoV-2 virus that causes COVID-19. Institutional protocols and algorithms that pertain to the evaluation of patients at risk for COVID-19 are in a state of rapid change based on information released by regulatory bodies including the CDC and federal and state organizations. These policies and algorithms were followed during the patient's care in the ED.  Some ED evaluations and interventions may be delayed as a result of limited staffing during the pandemic.   [place] Cont[place] Substance Database was reviewed by me. ____________________________________________   FINAL CLINICAL IMPRESSION(S) / ED DIAGNOSES   Final diagnoses:  ST elevation myocardial infarction (STEMI), unspecified artery (HCC)      NEW MEDICATIONS STARTED DURING THIS VISIT:  ED Discharge Orders    None       Note:  This document was prepared using Dragon voice  recognition software and may include unintentional dictation errors.    Nita Sickle, MD 11/28/20 [phone]

## 2020-11-29 ENCOUNTER — Encounter: Payer: Self-pay | Admitting: Cardiology

## 2020-11-29 DIAGNOSIS — I2102 ST elevation (STEMI) myocardial infarction involving left anterior descending coronary artery: Secondary | ICD-10-CM | POA: Diagnosis not present

## 2020-11-29 DIAGNOSIS — I10 Essential (primary) hypertension: Secondary | ICD-10-CM | POA: Diagnosis not present

## 2020-11-29 DIAGNOSIS — J449 Chronic obstructive pulmonary disease, unspecified: Secondary | ICD-10-CM | POA: Diagnosis not present

## 2020-11-29 DIAGNOSIS — I213 ST elevation (STEMI) myocardial infarction of unspecified site: Secondary | ICD-10-CM | POA: Diagnosis not present

## 2020-11-29 LAB — BASIC METABOLIC PANEL
Anion gap: 8 (ref 5–15)
BUN: 14 mg/dL (ref 8–23)
CO2: 19 mmol/L — ABNORMAL LOW (ref 22–32)
Calcium: 8.1 mg/dL — ABNORMAL LOW (ref 8.9–10.3)
Chloride: 112 mmol/L — ABNORMAL HIGH (ref 98–111)
Creatinine, Ser: 1.04 mg/dL (ref 0.61–1.24)
GFR, Estimated: >60 mL/min (ref >60)
Glucose, Bld: 83 mg/dL (ref 70–99)
Potassium: 3.8 mmol/L (ref 3.5–5.1)
Sodium: 139 mmol/L (ref 135–145)

## 2020-11-29 LAB — CBC
HCT: 40 % (ref 39.0–52.0)
Hemoglobin: 13.7 g/dL (ref 13.0–17.0)
MCH: 30.9 pg (ref 26.0–34.0)
MCHC: 34.3 g/dL (ref 30.0–36.0)
MCV: 90.3 fL (ref 80.0–100.0)
Platelets: 257 10*3/uL (ref 150–400)
RBC: 4.43 MIL/uL (ref 4.22–5.81)
RDW: 13.8 % (ref 11.5–15.5)
WBC: 10.5 10*3/uL (ref 4.0–10.5)
nRBC: 0 % (ref 0.0–0.2)

## 2020-11-29 LAB — LIPID PANEL
Cholesterol: 150 mg/dL (ref 0–200)
HDL: 38 mg/dL — ABNORMAL LOW (ref >40)
LDL Cholesterol: 98 mg/dL (ref 0–99)
Total CHOL/HDL Ratio: 3.9 RATIO
Triglycerides: 68 mg/dL (ref <150)
VLDL: 14 mg/dL (ref 0–40)

## 2020-11-29 MED ORDER — ATORVASTATIN CALCIUM 20 MG PO TABS: 80.0000 mg | ORAL_TABLET | Freq: Every evening | ORAL | Status: DC

## 2020-11-29 MED ORDER — IOHEXOL 300 MG/ML  SOLN
INTRAMUSCULAR | Status: DC | PRN
  Administered 2020-11-28: 214 mL

## 2020-11-29 MED ORDER — SODIUM CHLORIDE 0.9 % IV SOLN: INTRAVENOUS | Status: AC

## 2020-11-29 MED ORDER — CHLORHEXIDINE GLUCONATE CLOTH 2 % EX PADS
6.0000 | MEDICATED_PAD | Freq: Every day | CUTANEOUS | Status: DC
  Administered 2020-11-30: 6 via TOPICAL

## 2020-11-29 NOTE — Progress Notes (Signed)
Patient took Brilinta. Patient refusing to let this nurse hook up NS for continuous infusion "because that damn thing always beeps and wakes me up and then the IV stops working and you guys have to stick me again." IV is currently in left forearm close to [facility]. Patient is refusing to let this RN look for another IV that is less likely to make the pump alarm. Patient also complaining of getting tangled up in cords as he turns in bed "and that's just some other shit to get caught up in." MD aware that patient is refusing IV fluids.

## 2020-11-29 NOTE — Progress Notes (Signed)
Chaplain Maggie made initial visitation with patient while rounding. Patient expressed he feels well cared for and noted his faith in [place]. Chaplain made room for storytelling and empathetic listening. Patient has local family support and shared his pride about his grand daughters. Chaplain available for continued support as needed.

## 2020-11-29 NOTE — Progress Notes (Signed)
Patient is refusing to take any of is morning medications except for his ASA. He states, "I'm not a pill taker and all that shit made me sick yesterday." Explained to patient that because he had a stent placed he should at least take the brilinta to help prevent the stents he had placed from re-occluding. Patient still refusing to take medications. MD into room to talk with patient. Patient agreeable to taking brilinta.

## 2020-11-29 NOTE — Progress Notes (Signed)
PROGRESS NOTE    Peter Page  MPN:[id number] DOB: [birth year] DOA: 11/28/2020 PCP: Patient, No Pcp Per (Inactive)    Brief Narrative:  60 y/o male with history of COPD and HTN, admitted with chest pain and found to have STEMI. He went to cath lab and had stents placed in LAD. EF noted to be 40-45% consistent with ischemic cardiomyopathy. Blood pressures have been running low post cath.    Assessment & Plan:   Principal Problem:   Acute ST elevation myocardial infarction (STEMI) involving left anterior descending (LAD) coronary artery (HCC) Active Problems:   History of tobacco use   Chronic obstructive pulmonary disease (HCC)   Essential hypertension   STEMI -s/p cath with PCI with DES proximal /mid LAD -continue aspirin/brilinta for 1 year -holding off on BB and ACE/ARB due to low blood pressures -continue statin  Ischemic cardiomyopathy -EF 40-45% -medical therapy limited at this time due to low blood pressures  Hypertension -chronically on amlodipine -blood pressures currently running low -will provide gentle hydration and follow volume status closely  COPD -no shortness of breath or wheezing -stable -will need outpatient CT chest to follow up bilateral hazy densities at apices  DVT prophylaxis: enoxaparin (LOVENOX) injection 40 mg Start: 11/28/20 1000  Code Status: full code Family Communication: discussed with patient Disposition Plan: Status is: Inpatient  Remains inpatient appropriate because:Hemodynamically unstable and Inpatient level of care appropriate due to severity of illness   Dispo: The patient is from: Home              Anticipated d/c is to: Home              Patient currently is not medically stable to d/c.   Difficult to place patient No         Consultants:   cardiology  Procedures:     Antimicrobials:       Subjective: Denies any chest pain or shortness of breath  Objective: Vitals:   11/29/20 0600 11/29/20 0700  11/29/20 0831 11/29/20 0900  BP: (!) 80/65 96/82 (!) 89/47 (!) 86/55  Pulse: (!) 57 (!) 58 87 (!) 52  Resp: (!) 26 (!) 23 (!) 21 (!) 25  Temp:   98.1 F (36.7 C)   TempSrc:   Oral   SpO2: 94% 95% 95% 95%  Weight:      Height:        Intake/Output Summary (Last 24 hours) at 11/29/2020 1057 Last data filed at 11/29/2020 0400 Gross per 24 hour  Intake 1544.95 ml  Output 1155 ml  Net 389.95 ml   Filed Weights   11/28/20 0515  Weight: 55.5 kg    Examination:  General exam: Appears calm and comfortable  Respiratory system: Clear to auscultation. Respiratory effort normal. Cardiovascular system: S1 & S2 heard, RRR. No JVD, murmurs, rubs, gallops or clicks. No pedal edema. Gastrointestinal system: Abdomen is nondistended, soft and nontender. No organomegaly or masses felt. Normal bowel sounds heard. Central nervous system: Alert and oriented. No focal neurological deficits. Extremities: Symmetric 5 x 5 power. Skin: No rashes, lesions or ulcers Psychiatry: Judgement and insight appear normal. Mood & affect appropriate.     Data Reviewed: I have personally reviewed following labs and imaging studies  CBC: Recent Labs  Lab 11/28/20 0521 11/29/20 0436  WBC 13.3* 10.5  HGB 15.7 13.7  HCT 46.7 40.0  MCV 92.8 90.3  PLT 353 257   Basic Metabolic Panel: Recent Labs  Lab 11/28/20 0521 11/29/20 0436  NA 141 139  K 4.1 3.8  CL 108 112*  CO2 20* 19*  GLUCOSE 181* 83  BUN 15 14  CREATININE 1.16 1.04  CALCIUM 9.0 8.1*   GFR: Estimated Creatinine Clearance: 58.6 mL/min (by C-G formula based on SCr of 1.04 mg/dL). Liver Function Tests: No results for input(s): AST, ALT, ALKPHOS, BILITOT, PROT, ALBUMIN in the last 168 hours. No results for input(s): LIPASE, AMYLASE in the last 168 hours. No results for input(s): AMMONIA in the last 168 hours. Coagulation Profile: No results for input(s): INR, PROTIME in the last 168 hours. Cardiac Enzymes: No results for input(s):  CKTOTAL, CKMB, CKMBINDEX, TROPONINI in the last 168 hours. BNP (last 3 results) No results for input(s): PROBNP in the last 8760 hours. HbA1C: No results for input(s): HGBA1C in the last 72 hours. CBG: Recent Labs  Lab 11/28/20 0654 11/28/20 1634  GLUCAP 110* 94   Lipid Profile: Recent Labs    11/29/20 0436  CHOL 150  HDL 38*  LDLCALC 98  TRIG 68  CHOLHDL 3.9   Thyroid Function Tests: No results for input(s): TSH, T4TOTAL, FREET4, T3FREE, THYROIDAB in the last 72 hours. Anemia Panel: No results for input(s): VITAMINB12, FOLATE, FERRITIN, TIBC, IRON, RETICCTPCT in the last 72 hours. Sepsis Labs: No results for input(s): PROCALCITON, LATICACIDVEN in the last 168 hours.  Recent Results (from the past 240 hour(s))  Resp Panel by RT-PCR (Flu A&B, Covid) Nasopharyngeal Swab     Status: None   Collection Time: 11/28/20  5:08 AM   Specimen: Nasopharyngeal Swab; Nasopharyngeal(NP) swabs in vial transport medium  Result Value Ref Range Status   SARS Coronavirus 2 by RT PCR NEGATIVE NEGATIVE Final    Comment: (NOTE) SARS-CoV-2 target nucleic acids are NOT DETECTED.  The SARS-CoV-2 RNA is generally detectable in upper respiratory specimens during the acute phase of infection. The lowest concentration of SARS-CoV-2 viral copies this assay can detect is 138 copies/mL. A negative result does not preclude SARS-Cov-2 infection and should not be used as the sole basis for treatment or other patient management decisions. A negative result may occur with  improper specimen collection/handling, submission of specimen other than nasopharyngeal swab, presence of viral mutation(s) within the areas targeted by this assay, and inadequate number of viral copies(<138 copies/mL). A negative result must be combined with clinical observations, patient history, and epidemiological information. The expected result is Negative.  Fact Sheet for Patients:   BloggerCourse.com  Fact Sheet for Healthcare Providers:  SeriousBroker.it  This test is no t yet approved or cleared by the [place] FDA and  has been authorized for detection and/or diagnosis of SARS-CoV-2 by FDA under an Emergency Use Authorization (EUA). This EUA will remain  in effect (meaning this test can be used) for the duration of the COVID-19 declaration under Section 564(b)(1) of the Act, 21 U.S.C.section 360bbb-3(b)(1), unless the authorization is terminated  or revoked sooner.       Influenza A by PCR NEGATIVE NEGATIVE Final   Influenza B by PCR NEGATIVE NEGATIVE Final    Comment: (NOTE) The Xpert Xpress SARS-CoV-2/FLU/RSV plus assay is intended as an aid in the diagnosis of influenza from Nasopharyngeal swab specimens and should not be used as a sole basis for treatment. Nasal washings and aspirates are unacceptable for Xpert Xpress SARS-CoV-2/FLU/RSV testing.  Fact Sheet for Patients: BloggerCourse.com  Fact Sheet for Healthcare Providers: SeriousBroker.it  This test is not yet approved or cleared by the [place] FDA and has been authorized for detection  and/or diagnosis of SARS-CoV-2 by FDA under an Emergency Use Authorization (EUA). This EUA will remain in effect (meaning this test can be used) for the duration of the COVID-19 declaration under Section 564(b)(1) of the Act, 21 U.S.C. section 360bbb-3(b)(1), unless the authorization is terminated or revoked.  Performed at [facility], [address] [place], [place] [address]   MRSA PCR Screening     Status: None   Collection Time: 11/28/20  6:55 AM   Specimen: Nasal Mucosa; Nasopharyngeal  Result Value Ref Range Status   MRSA by PCR NEGATIVE NEGATIVE Final    Comment:        The GeneXpert MRSA Assay (FDA approved for NASAL specimens only), is one component of a comprehensive MRSA  colonization surveillance program. It is not intended to diagnose MRSA infection nor to guide or monitor treatment for MRSA infections. Performed at [facility], [address] [place], [place] [address]          Radiology Studies: CARDIAC CATHETERIZATION  Result Date: 11/28/2020  Prox RCA-1 lesion is 30% stenosed.  Prox RCA-2 lesion is 20% stenosed.  3rd Mrg lesion is 75% stenosed.  Prox LAD to Mid LAD lesion is 99% stenosed.  A drug-eluting stent was successfully placed using a STENT RESOLUTE ONYX [id number].  Post intervention, there is a 0% residual stenosis.  There is moderate (3+) mitral regurgitation.  1.  Anterior STEMI 2.  Two-vessel coronary artery disease with ulcerated 99% stenosis proximal/mid LAD and 75% stenosis OM 3 3.  Mildly reduced left ventricular function with anteroapical akinesis 4.  Moderate to severe mitral regurgitation 5.  Successful primary PCI with DES proximal/mid LAD Recommendations 1.  Dual antiplatelet therapy uninterrupted for 1 year 2.  High intensity atorvastatin 80 mg daily 3.  Metoprolol tartrate 25 mg twice daily 4.  2D echocardiogram   DG Chest Portable 1 View  Result Date: 11/28/2020 CLINICAL DATA:  ST elevation myocardial infarction EXAM: PORTABLE CHEST 1 VIEW COMPARISON:  05/15/2020 FINDINGS: Hyperinflation and interstitial coarsening with biapical architectural distortion. There is hazy density bilaterally, nodule not excluded. Artifact from EKG leads and defibrillator pads. Normal heart size and stable aortic contours. IMPRESSION: 1. Recommend follow-up chest CT to evaluate hazy density at the apices. 2. Emphysema. 3. No acute finding related to the history of myocardial infarction. Electronically Signed   By: Marnee Spring M.D.   On: 11/28/2020 05:19   ECHOCARDIOGRAM COMPLETE  Result Date: 11/28/2020    ECHOCARDIOGRAM REPORT   Patient Name:   Peter Page Date of Exam: 11/28/2020 Medical Rec #:  [id number]       Height:        69.0 in Accession #:    [id number]      Weight:       122.4 lb Date of Birth:  1960/08/20       BSA:          1.677 m Patient Age:    60 years        BP:           129/76 mmHg Patient Gender: M               HR:           61 bpm. Exam Location:  [facility] Procedure: 2D Echo and Strain Analysis Indications:     AMI I21.9  History:         Patient has prior history of Echocardiogram examinations, most  recent 05/17/2020.  Sonographer:     Overton Mam RDCS Referring Phys:  [id number] Lyn Hollingshead PARASCHOS Diagnosing Phys: Marcina Millard MD  Sonographer Comments: Global longitudinal strain was attempted. IMPRESSIONS  1. Left ventricular ejection fraction, by estimation, is 40 to 45%. The left ventricle has mildly decreased function. The left ventricle demonstrates regional wall motion abnormalities (see scoring diagram/findings for description). Left ventricular diastolic parameters were normal.  2. Right ventricular systolic function is normal. The right ventricular size is normal.  3. The mitral valve is normal in structure. Mild mitral valve regurgitation. No evidence of mitral stenosis.  4. Tricuspid valve regurgitation is mild to moderate.  5. The aortic valve is normal in structure. Aortic valve regurgitation is not visualized. No aortic stenosis is present.  6. The inferior vena cava is normal in size with greater than 50% respiratory variability, suggesting right atrial pressure of 3 mmHg. FINDINGS  Left Ventricle: Left ventricular ejection fraction, by estimation, is 40 to 45%. The left ventricle has mildly decreased function. The left ventricle demonstrates regional wall motion abnormalities. The left ventricular internal cavity size was normal in size. There is no left ventricular hypertrophy. Left ventricular diastolic parameters were normal.  LV Wall Scoring: The entire apex is akinetic. Right Ventricle: The right ventricular size is normal. No increase in right ventricular wall thickness.  Right ventricular systolic function is normal. Left Atrium: Left atrial size was normal in size. Right Atrium: Right atrial size was normal in size. Pericardium: There is no evidence of pericardial effusion. Mitral Valve: The mitral valve is normal in structure. Mild mitral valve regurgitation. No evidence of mitral valve stenosis. Tricuspid Valve: The tricuspid valve is normal in structure. Tricuspid valve regurgitation is mild to moderate. No evidence of tricuspid stenosis. Aortic Valve: The aortic valve is normal in structure. Aortic valve regurgitation is not visualized. No aortic stenosis is present. Aortic valve peak gradient measures 3.6 mmHg. Pulmonic Valve: The pulmonic valve was normal in structure. Pulmonic valve regurgitation is not visualized. No evidence of pulmonic stenosis. Aorta: The aortic root is normal in size and structure. Venous: The inferior vena cava is normal in size with greater than 50% respiratory variability, suggesting right atrial pressure of 3 mmHg. IAS/Shunts: No atrial level shunt detected by color flow Doppler.  LEFT VENTRICLE PLAX 2D LVIDd:         4.34 cm     Diastology LVIDs:         3.37 cm     LV e' medial:    7.51 cm/s LV PW:         1.07 cm     LV E/e' medial:  11.0 LV IVS:        0.91 cm     LV e' lateral:   9.36 cm/s LVOT diam:     2.20 cm     LV E/e' lateral: 8.8 LV SV:         55 LV SV Index:   33 LVOT Area:     3.80 cm  LV Volumes (MOD) LV vol d, MOD A2C: 75.2 ml LV vol d, MOD A4C: 92.0 ml LV vol s, MOD A2C: 36.9 ml LV vol s, MOD A4C: 45.1 ml LV SV MOD A2C:     38.3 ml LV SV MOD A4C:     92.0 ml LV SV MOD BP:      48.7 ml RIGHT VENTRICLE RV Basal diam:  2.99 cm RV S prime:     8.49 cm/s TAPSE (M-mode):  1.5 cm LEFT ATRIUM             Index       RIGHT ATRIUM          Index LA diam:        3.40 cm 2.03 cm/m  RA Area:     7.82 cm LA Vol (A2C):   30.0 ml 17.89 ml/m RA Volume:   14.10 ml 8.41 ml/m LA Vol (A4C):   25.6 ml 15.27 ml/m LA Biplane Vol: 30.1 ml 17.95 ml/m   AORTIC VALVE                PULMONIC VALVE AV Area (Vmax): 2.90 cm    PV Vmax:       0.76 m/s AV Vmax:        95.00 cm/s  PV Peak grad:  2.3 mmHg AV Peak Grad:   3.6 mmHg LVOT Vmax:      72.50 cm/s LVOT Vmean:     47.400 cm/s LVOT VTI:       0.145 m  AORTA Ao Root diam: 3.20 cm MITRAL VALVE               TRICUSPID VALVE MV Area (PHT): 4.19 cm    TV Peak grad:   23.3 mmHg MV Decel Time: 181 msec    TV Vmax:        2.42 m/s MV E velocity: 82.30 cm/s MV A velocity: 51.00 cm/s  SHUNTS MV E/A ratio:  1.61        Systemic VTI:  0.14 m                            Systemic Diam: 2.20 cm Marcina MillardAlexander Paraschos MD Electronically signed by Marcina MillardAlexander Paraschos MD Signature Date/Time: 11/28/2020/1:19:07 PM    Final         Scheduled Meds: . aspirin EC  81 mg Oral Daily  . atorvastatin  80 mg Oral QPM  . Chlorhexidine Gluconate Cloth  6 each Topical Daily  . enoxaparin (LOVENOX) injection  40 mg Subcutaneous Q24H  . famotidine  20 mg Oral Daily  . sodium chloride flush  3 mL Intravenous Q12H  . sodium chloride flush  3 mL Intravenous Q12H  . ticagrelor  90 mg Oral BID   Continuous Infusions: . sodium chloride    . sodium chloride    . sodium chloride       LOS: 1 day    Time spent: 35mins    Erick BlinksJehanzeb Ona Rathert, MD [facility]   If 7PM-7AM, please contact night-coverage [email or web address]  11/29/2020, 10:57 AM

## 2020-11-29 NOTE — Progress Notes (Signed)
[facility]    SUBJECTIVE: The patient denies chest pain or shortness of breath. He reports that he felt nauseated after taking all of his medications yesterday.   Vitals:   11/29/20 0600 11/29/20 0700 11/29/20 0831 11/29/20 0900  BP: (!) 80/65 96/82 (!) 89/47 (!) 86/55  Pulse: (!) 57 (!) 58 87 (!) 52  Resp: (!) 26 (!) 23 (!) 21 (!) 25  Temp:   98.1 F (36.7 C)   TempSrc:   Oral   SpO2: 94% 95% 95% 95%  Weight:      Height:         Intake/Output Summary (Last 24 hours) at 11/29/2020 0913 Last data filed at 11/29/2020 0400 Gross per 24 hour  Intake 1544.95 ml  Output 1155 ml  Net 389.95 ml      PHYSICAL EXAM  General: Frail gentleman lying in bed in no acute distress HEENT:  Normocephalic and atramatic Neck:  No JVD.  Lungs: Clear bilaterally to auscultation normal effort of breathing on RA Heart: HRRR . Normal S1 and S2 without gallops or murmurs.  Msk:  No obvious deformities Extremities: No clubbing, cyanosis or edema.   Neuro: Alert and oriented X 3. Psych:  Good affect, responds appropriately   LABS: Basic Metabolic Panel: Recent Labs    11/28/20 0521 11/29/20 0436  NA 141 139  K 4.1 3.8  CL 108 112*  CO2 20* 19*  GLUCOSE 181* 83  BUN 15 14  CREATININE 1.16 1.04  CALCIUM 9.0 8.1*   Liver Function Tests: No results for input(s): AST, ALT, ALKPHOS, BILITOT, PROT, ALBUMIN in the last 72 hours. No results for input(s): LIPASE, AMYLASE in the last 72 hours. CBC: Recent Labs    11/28/20 0521 11/29/20 0436  WBC 13.3* 10.5  HGB 15.7 13.7  HCT 46.7 40.0  MCV 92.8 90.3  PLT 353 257   Cardiac Enzymes: No results for input(s): CKTOTAL, CKMB, CKMBINDEX, TROPONINI in the last 72 hours. BNP: Invalid input(s): POCBNP D-Dimer: No results for input(s): DDIMER in the last 72 hours. Hemoglobin A1C: No results for input(s): HGBA1C in the last 72 hours. Fasting Lipid Panel: Recent Labs    11/29/20 0436  CHOL 150  HDL 38*  LDLCALC 98  TRIG 68   CHOLHDL 3.9   Thyroid Function Tests: No results for input(s): TSH, T4TOTAL, T3FREE, THYROIDAB in the last 72 hours.  Invalid input(s): FREET3 Anemia Panel: No results for input(s): VITAMINB12, FOLATE, FERRITIN, TIBC, IRON, RETICCTPCT in the last 72 hours.  CARDIAC CATHETERIZATION  Result Date: 11/28/2020  Prox RCA-1 lesion is 30% stenosed.  Prox RCA-2 lesion is 20% stenosed.  3rd Mrg lesion is 75% stenosed.  Prox LAD to Mid LAD lesion is 99% stenosed.  A drug-eluting stent was successfully placed using a STENT RESOLUTE ONYX [id number].  Post intervention, there is a 0% residual stenosis.  There is moderate (3+) mitral regurgitation.  1.  Anterior STEMI 2.  Two-vessel coronary artery disease with ulcerated 99% stenosis proximal/mid LAD and 75% stenosis OM 3 3.  Mildly reduced left ventricular function with anteroapical akinesis 4.  Moderate to severe mitral regurgitation 5.  Successful primary PCI with DES proximal/mid LAD Recommendations 1.  Dual antiplatelet therapy uninterrupted for 1 year 2.  High intensity atorvastatin 80 mg daily 3.  Metoprolol tartrate 25 mg twice daily 4.  2D echocardiogram   DG Chest Portable 1 View  Result Date: 11/28/2020 CLINICAL DATA:  ST elevation myocardial infarction EXAM: PORTABLE CHEST 1 VIEW COMPARISON:  05/15/2020  FINDINGS: Hyperinflation and interstitial coarsening with biapical architectural distortion. There is hazy density bilaterally, nodule not excluded. Artifact from EKG leads and defibrillator pads. Normal heart size and stable aortic contours. IMPRESSION: 1. Recommend follow-up chest CT to evaluate hazy density at the apices. 2. Emphysema. 3. No acute finding related to the history of myocardial infarction. Electronically Signed   By: Marnee Spring M.D.   On: 11/28/2020 05:19   ECHOCARDIOGRAM COMPLETE  Result Date: 11/28/2020    ECHOCARDIOGRAM REPORT   Patient Name:   CONLIN BRAHM Date of Exam: 11/28/2020 Medical Rec #:  [id number]        Height:       69.0 in Accession #:    [id number]      Weight:       122.4 lb Date of Birth:  [birth year]       BSA:          1.677 m Patient Age:    60 years        BP:           129/76 mmHg Patient Gender: M               HR:           61 bpm. Exam Location:  [facility] Procedure: 2D Echo and Strain Analysis Indications:     AMI I21.9  History:         Patient has prior history of Echocardiogram examinations, most                  recent 05/17/2020.  Sonographer:     Overton Mam RDCS Referring Phys:  [id number] Lyn Hollingshead PARASCHOS Diagnosing Phys: Marcina Millard MD  Sonographer Comments: Global longitudinal strain was attempted. IMPRESSIONS  1. Left ventricular ejection fraction, by estimation, is 40 to 45%. The left ventricle has mildly decreased function. The left ventricle demonstrates regional wall motion abnormalities (see scoring diagram/findings for description). Left ventricular diastolic parameters were normal.  2. Right ventricular systolic function is normal. The right ventricular size is normal.  3. The mitral valve is normal in structure. Mild mitral valve regurgitation. No evidence of mitral stenosis.  4. Tricuspid valve regurgitation is mild to moderate.  5. The aortic valve is normal in structure. Aortic valve regurgitation is not visualized. No aortic stenosis is present.  6. The inferior vena cava is normal in size with greater than 50% respiratory variability, suggesting right atrial pressure of 3 mmHg. FINDINGS  Left Ventricle: Left ventricular ejection fraction, by estimation, is 40 to 45%. The left ventricle has mildly decreased function. The left ventricle demonstrates regional wall motion abnormalities. The left ventricular internal cavity size was normal in size. There is no left ventricular hypertrophy. Left ventricular diastolic parameters were normal.  LV Wall Scoring: The entire apex is akinetic. Right Ventricle: The right ventricular size is normal. No increase in right ventricular wall  thickness. Right ventricular systolic function is normal. Left Atrium: Left atrial size was normal in size. Right Atrium: Right atrial size was normal in size. Pericardium: There is no evidence of pericardial effusion. Mitral Valve: The mitral valve is normal in structure. Mild mitral valve regurgitation. No evidence of mitral valve stenosis. Tricuspid Valve: The tricuspid valve is normal in structure. Tricuspid valve regurgitation is mild to moderate. No evidence of tricuspid stenosis. Aortic Valve: The aortic valve is normal in structure. Aortic valve regurgitation is not visualized. No aortic stenosis is present. Aortic valve peak gradient measures 3.6 mmHg. Pulmonic Valve:  The pulmonic valve was normal in structure. Pulmonic valve regurgitation is not visualized. No evidence of pulmonic stenosis. Aorta: The aortic root is normal in size and structure. Venous: The inferior vena cava is normal in size with greater than 50% respiratory variability, suggesting right atrial pressure of 3 mmHg. IAS/Shunts: No atrial level shunt detected by color flow Doppler.  LEFT VENTRICLE PLAX 2D LVIDd:         4.34 cm     Diastology LVIDs:         3.37 cm     LV e' medial:    7.51 cm/s LV PW:         1.07 cm     LV E/e' medial:  11.0 LV IVS:        0.91 cm     LV e' lateral:   9.36 cm/s LVOT diam:     2.20 cm     LV E/e' lateral: 8.8 LV SV:         55 LV SV Index:   33 LVOT Area:     3.80 cm  LV Volumes (MOD) LV vol d, MOD A2C: 75.2 ml LV vol d, MOD A4C: 92.0 ml LV vol s, MOD A2C: 36.9 ml LV vol s, MOD A4C: 45.1 ml LV SV MOD A2C:     38.3 ml LV SV MOD A4C:     92.0 ml LV SV MOD BP:      48.7 ml RIGHT VENTRICLE RV Basal diam:  2.99 cm RV S prime:     8.49 cm/s TAPSE (M-mode): 1.5 cm LEFT ATRIUM             Index       RIGHT ATRIUM          Index LA diam:        3.40 cm 2.03 cm/m  RA Area:     7.82 cm LA Vol (A2C):   30.0 ml 17.89 ml/m RA Volume:   14.10 ml 8.41 ml/m LA Vol (A4C):   25.6 ml 15.27 ml/m LA Biplane Vol: 30.1 ml  17.95 ml/m  AORTIC VALVE                PULMONIC VALVE AV Area (Vmax): 2.90 cm    PV Vmax:       0.76 m/s AV Vmax:        95.00 cm/s  PV Peak grad:  2.3 mmHg AV Peak Grad:   3.6 mmHg LVOT Vmax:      72.50 cm/s LVOT Vmean:     47.400 cm/s LVOT VTI:       0.145 m  AORTA Ao Root diam: 3.20 cm MITRAL VALVE               TRICUSPID VALVE MV Area (PHT): 4.19 cm    TV Peak grad:   23.3 mmHg MV Decel Time: 181 msec    TV Vmax:        2.42 m/s MV E velocity: 82.30 cm/s MV A velocity: 51.00 cm/s  SHUNTS MV E/A ratio:  1.61        Systemic VTI:  0.14 m                            Systemic Diam: 2.20 cm Marcina Millard MD Electronically signed by Marcina Millard MD Signature Date/Time: 11/28/2020/1:19:07 PM    Final      Echo LVEF 40-45%, moderate MR  TELEMETRY: sinus  rhythm, 60s  ASSESSMENT AND PLAN:  Principal Problem:   Acute ST elevation myocardial infarction (STEMI) involving left anterior descending (LAD) coronary artery (HCC) Active Problems:   History of tobacco use   Chronic obstructive pulmonary disease (HCC)   Essential hypertension    1. Anterior STEMI, status post successful primary PCI with DES proximal/mid LAD, currently on aspirin and Brilinta 2. Hypotension, minimally symptomatic 3. Mildly reduced left ventricular function  Recommendations: 1. Continue aspirin and Brilinta uninterrupted for 1 year 2. Continue high intensity statin 3. Gentle IV fluids as needed for blood pressure 4. Defer ACE-I or BB in light of hypotension 5. Consider transferring to cardiac progressive care unit today    Peter Page, Cordelia Poche 11/29/2020 9:13 AM

## 2020-11-30 DIAGNOSIS — E43 Unspecified severe protein-calorie malnutrition: Secondary | ICD-10-CM | POA: Insufficient documentation

## 2020-11-30 DIAGNOSIS — I2102 ST elevation (STEMI) myocardial infarction involving left anterior descending coronary artery: Secondary | ICD-10-CM | POA: Diagnosis not present

## 2020-11-30 LAB — CBC
HCT: 42.7 % (ref 39.0–52.0)
Hemoglobin: 14.6 g/dL (ref 13.0–17.0)
MCH: 30.2 pg (ref 26.0–34.0)
MCHC: 34.2 g/dL (ref 30.0–36.0)
MCV: 88.2 fL (ref 80.0–100.0)
Platelets: 235 10*3/uL (ref 150–400)
RBC: 4.84 MIL/uL (ref 4.22–5.81)
RDW: 13.4 % (ref 11.5–15.5)
WBC: 8.8 10*3/uL (ref 4.0–10.5)
nRBC: 0 % (ref 0.0–0.2)

## 2020-11-30 LAB — BASIC METABOLIC PANEL
Anion gap: 7 (ref 5–15)
BUN: 13 mg/dL (ref 8–23)
CO2: 22 mmol/L (ref 22–32)
Calcium: 8.2 mg/dL — ABNORMAL LOW (ref 8.9–10.3)
Chloride: 106 mmol/L (ref 98–111)
Creatinine, Ser: 0.89 mg/dL (ref 0.61–1.24)
GFR, Estimated: >60 mL/min (ref >60)
Glucose, Bld: 96 mg/dL (ref 70–99)
Potassium: 3.7 mmol/L (ref 3.5–5.1)
Sodium: 135 mmol/L (ref 135–145)

## 2020-11-30 MED ORDER — ADULT MULTIVITAMIN W/MINERALS CH
1.0000 | ORAL_TABLET | Freq: Every day | ORAL | Status: DC
  Administered 2020-11-30: 1 via ORAL
  Filled 2020-11-30: qty 1

## 2020-11-30 MED ORDER — ENSURE ENLIVE PO LIQD: 237.0000 mL | Freq: Three times a day (TID) | ORAL | 12 refills | Status: AC

## 2020-11-30 MED ORDER — ENSURE ENLIVE PO LIQD
237.0000 mL | Freq: Three times a day (TID) | ORAL | Status: DC
  Administered 2020-11-30: 237 mL via ORAL

## 2020-11-30 MED ORDER — METOPROLOL SUCCINATE ER 25 MG PO TB24: 12.5000 mg | ORAL_TABLET | Freq: Every day | ORAL | 1 refills | Status: DC

## 2020-11-30 MED ORDER — METOPROLOL SUCCINATE ER 25 MG PO TB24
12.5000 mg | ORAL_TABLET | Freq: Every day | ORAL | Status: DC
  Filled 2020-11-30: qty 0.5

## 2020-11-30 MED ORDER — ATORVASTATIN CALCIUM 80 MG PO TABS: 80.0000 mg | ORAL_TABLET | Freq: Every day | ORAL | 1 refills | Status: AC

## 2020-11-30 MED ORDER — ADULT MULTIVITAMIN W/MINERALS CH: 1.0000 | ORAL_TABLET | Freq: Every day | ORAL | 1 refills | Status: AC

## 2020-11-30 MED ORDER — TICAGRELOR 90 MG PO TABS: 90.0000 mg | ORAL_TABLET | Freq: Two times a day (BID) | ORAL | 1 refills | Status: AC

## 2020-11-30 MED ORDER — NITROGLYCERIN 0.4 MG SL SUBL: 0.4000 mg | SUBLINGUAL_TABLET | SUBLINGUAL | 12 refills | Status: AC | PRN

## 2020-11-30 NOTE — Progress Notes (Signed)
[facility]  SUBJECTIVE: Patient laying in bed, denies chest pain or shortness of breath   Vitals:   11/30/20 0500 11/30/20 0600 11/30/20 0700 11/30/20 0800  BP: 93/70 95/79 99/82  106/89  Pulse: 77 78 84 82  Resp: (!) 26 18 (!) 31 (!) 22  Temp:      TempSrc:      SpO2: 95% 90% 95% 91%  Weight:      Height:         Intake/Output Summary (Last 24 hours) at 11/30/2020 0801 Last data filed at 11/30/2020 01/30/2021 Gross per 24 hour  Intake 480 ml  Output 1450 ml  Net -970 ml      PHYSICAL EXAM  General: Well developed, well nourished, in no acute distress HEENT:  Normocephalic and atramatic Neck:  No JVD.  Lungs: Clear bilaterally to auscultation and percussion. Heart: HRRR . Normal S1 and S2 without gallops or murmurs.  Abdomen: Bowel sounds are positive, abdomen soft and non-tender  Msk:  Back normal, normal gait. Normal strength and tone for age. Extremities: No clubbing, cyanosis or edema.   Neuro: Alert and oriented X 3. Psych:  Good affect, responds appropriately   LABS: Basic Metabolic Panel: Recent Labs    11/29/20 0436 11/30/20 0430  NA 139 135  K 3.8 3.7  CL 112* 106  CO2 19* 22  GLUCOSE 83 96  BUN 14 13  CREATININE 1.04 0.89  CALCIUM 8.1* 8.2*   Liver Function Tests: No results for input(s): AST, ALT, ALKPHOS, BILITOT, PROT, ALBUMIN in the last 72 hours. No results for input(s): LIPASE, AMYLASE in the last 72 hours. CBC: Recent Labs    11/29/20 0436 11/30/20 0430  WBC 10.5 8.8  HGB 13.7 14.6  HCT 40.0 42.7  MCV 90.3 88.2  PLT 257 235   Cardiac Enzymes: No results for input(s): CKTOTAL, CKMB, CKMBINDEX, TROPONINI in the last 72 hours. BNP: Invalid input(s): POCBNP D-Dimer: No results for input(s): DDIMER in the last 72 hours. Hemoglobin A1C: No results for input(s): HGBA1C in the last 72 hours. Fasting Lipid Panel: Recent Labs    11/29/20 0436  CHOL 150  HDL 38*  LDLCALC 98  TRIG 68  CHOLHDL 3.9   Thyroid Function Tests: No  results for input(s): TSH, T4TOTAL, T3FREE, THYROIDAB in the last 72 hours.  Invalid input(s): FREET3 Anemia Panel: No results for input(s): VITAMINB12, FOLATE, FERRITIN, TIBC, IRON, RETICCTPCT in the last 72 hours.  ECHOCARDIOGRAM COMPLETE  Result Date: 11/28/2020    ECHOCARDIOGRAM REPORT   Patient Name:   Peter Page Date of Exam: 11/28/2020 Medical Rec #:  11/30/2020       Height:       69.0 in Accession #:    [id number]      Weight:       122.4 lb Date of Birth:  10-May-1961       BSA:          1.677 m Patient Age:    60 years        BP:           129/76 mmHg Patient Gender: M               HR:           61 bpm. Exam Location:  [facility] Procedure: 2D Echo and Strain Analysis Indications:     AMI I21.9  History:         Patient has prior history of Echocardiogram examinations, most  recent 05/17/2020.  Sonographer:     Overton Mam RDCS Referring Phys:  [id number] Lyn Hollingshead Suhayb Anzalone Diagnosing Phys: Marcina Millard MD  Sonographer Comments: Global longitudinal strain was attempted. IMPRESSIONS  1. Left ventricular ejection fraction, by estimation, is 40 to 45%. The left ventricle has mildly decreased function. The left ventricle demonstrates regional wall motion abnormalities (see scoring diagram/findings for description). Left ventricular diastolic parameters were normal.  2. Right ventricular systolic function is normal. The right ventricular size is normal.  3. The mitral valve is normal in structure. Mild mitral valve regurgitation. No evidence of mitral stenosis.  4. Tricuspid valve regurgitation is mild to moderate.  5. The aortic valve is normal in structure. Aortic valve regurgitation is not visualized. No aortic stenosis is present.  6. The inferior vena cava is normal in size with greater than 50% respiratory variability, suggesting right atrial pressure of 3 mmHg. FINDINGS  Left Ventricle: Left ventricular ejection fraction, by estimation, is 40 to 45%. The left ventricle has  mildly decreased function. The left ventricle demonstrates regional wall motion abnormalities. The left ventricular internal cavity size was normal in size. There is no left ventricular hypertrophy. Left ventricular diastolic parameters were normal.  LV Wall Scoring: The entire apex is akinetic. Right Ventricle: The right ventricular size is normal. No increase in right ventricular wall thickness. Right ventricular systolic function is normal. Left Atrium: Left atrial size was normal in size. Right Atrium: Right atrial size was normal in size. Pericardium: There is no evidence of pericardial effusion. Mitral Valve: The mitral valve is normal in structure. Mild mitral valve regurgitation. No evidence of mitral valve stenosis. Tricuspid Valve: The tricuspid valve is normal in structure. Tricuspid valve regurgitation is mild to moderate. No evidence of tricuspid stenosis. Aortic Valve: The aortic valve is normal in structure. Aortic valve regurgitation is not visualized. No aortic stenosis is present. Aortic valve peak gradient measures 3.6 mmHg. Pulmonic Valve: The pulmonic valve was normal in structure. Pulmonic valve regurgitation is not visualized. No evidence of pulmonic stenosis. Aorta: The aortic root is normal in size and structure. Venous: The inferior vena cava is normal in size with greater than 50% respiratory variability, suggesting right atrial pressure of 3 mmHg. IAS/Shunts: No atrial level shunt detected by color flow Doppler.  LEFT VENTRICLE PLAX 2D LVIDd:         4.34 cm     Diastology LVIDs:         3.37 cm     LV e' medial:    7.51 cm/s LV PW:         1.07 cm     LV E/e' medial:  11.0 LV IVS:        0.91 cm     LV e' lateral:   9.36 cm/s LVOT diam:     2.20 cm     LV E/e' lateral: 8.8 LV SV:         55 LV SV Index:   33 LVOT Area:     3.80 cm  LV Volumes (MOD) LV vol d, MOD A2C: 75.2 ml LV vol d, MOD A4C: 92.0 ml LV vol s, MOD A2C: 36.9 ml LV vol s, MOD A4C: 45.1 ml LV SV MOD A2C:     38.3 ml LV SV  MOD A4C:     92.0 ml LV SV MOD BP:      48.7 ml RIGHT VENTRICLE RV Basal diam:  2.99 cm RV S prime:     8.49 cm/s TAPSE (  M-mode): 1.5 cm LEFT ATRIUM             Index       RIGHT ATRIUM          Index LA diam:        3.40 cm 2.03 cm/m  RA Area:     7.82 cm LA Vol (A2C):   30.0 ml 17.89 ml/m RA Volume:   14.10 ml 8.41 ml/m LA Vol (A4C):   25.6 ml 15.27 ml/m LA Biplane Vol: 30.1 ml 17.95 ml/m  AORTIC VALVE                PULMONIC VALVE AV Area (Vmax): 2.90 cm    PV Vmax:       0.76 m/s AV Vmax:        95.00 cm/s  PV Peak grad:  2.3 mmHg AV Peak Grad:   3.6 mmHg LVOT Vmax:      72.50 cm/s LVOT Vmean:     47.400 cm/s LVOT VTI:       0.145 m  AORTA Ao Root diam: 3.20 cm MITRAL VALVE               TRICUSPID VALVE MV Area (PHT): 4.19 cm    TV Peak grad:   23.3 mmHg MV Decel Time: 181 msec    TV Vmax:        2.42 m/s MV E velocity: 82.30 cm/s MV A velocity: 51.00 cm/s  SHUNTS MV E/A ratio:  1.61        Systemic VTI:  0.14 m                            Systemic Diam: 2.20 cm Marcina Millard MD Electronically signed by Marcina Millard MD Signature Date/Time: 11/28/2020/1:19:07 PM    Final      Echo LVEF 40 to 45% with moderate mitral regurgitation  TELEMETRY: Sinus rhythm:  ASSESSMENT AND PLAN:  Principal Problem:   Acute ST elevation myocardial infarction (STEMI) involving left anterior descending (LAD) coronary artery (HCC) Active Problems:   History of tobacco use   Chronic obstructive pulmonary disease (HCC)   Essential hypertension    1. Anterior STEMI, status post successful primary PCI with DES proximal/mid LAD, currently on aspirin and Brilinta 2. Hypotension, minimally symptomatic, improved 3. Mildly reduced left ventricular function, no evidence for congestive heart failure  Recommendations: 1.  Continue aspirin and Brilinta uninterrupted for 1 year 2.  Continue high intensity atorvastatin 80 mg daily 3.  Add low-dose metoprolol succinate 12.5 mg daily 4.  May transfer to  telemetry, ambulate today, if patient continues to do well, consider discharge later today 5.  Follow-up with me in 1 week   Marcina Millard, MD, PhD, [facility] 11/30/2020 8:01 AM

## 2020-11-30 NOTE — Discharge Summary (Signed)
Physician Discharge Summary  Peter LynchJeffrey R Page ZOX:[id number] DOB: 15-Jun-1961 DOA: 11/28/2020  PCP: Patient, No Pcp Per (Inactive)  Admit date: 11/28/2020 Discharge date: 11/30/2020  Admitted From: Home Disposition: Home  Recommendations for Outpatient Follow-up:  1. Follow up with PCP in 1-2 weeks 2. Follow-up with cardiology within 1 week 3. Please obtain BMP/CBC in one week 4. Please follow up on the following pending results: None  Home Health: No Equipment/Devices: None Discharge Condition: Stable CODE STATUS: Full Diet recommendation: Heart Healthy   Brief/Interim Summary: 60 y/o male with history of COPD and HTN, admitted with chest pain and found to have STEMI. He went to cath lab and had stents placed in LAD. EF noted to be 40-45% consistent with ischemic cardiomyopathy. Patient was started on DAPT with aspirin and Brilinta and he will continue with them for 1 year.  Patient was also started on low-dose beta-blocker, holding ACE inhibitor or ARB due to borderline softer blood pressure, he will follow-up closely with cardiology for further management and addition of ACE inhibitor or ARB.  His home dose of Lipitor was also increased to 80 mg daily.  Patient has an history of hypertension and was taking amlodipine which was discontinued due to borderline blood pressure.  Once blood pressure improved patient needs ACE inhibitor or ARB instead of amlodipine. He was advised to keep himself well-hydrated.  Patient also has an history of COPD, quit smoking 3 years ago.  No acute exacerbation during current hospitalization.  Chest x-ray with some bilateral hazy densities at the apices, he will need outpatient CT chest for further evaluation.  Patient will continue with current updated medications and follow-up with his providers closely for further recommendations.  Discharge Diagnoses:  Principal Problem:   Acute ST elevation myocardial infarction (STEMI) involving left anterior  descending (LAD) coronary artery (HCC) Active Problems:   History of tobacco use   Chronic obstructive pulmonary disease (HCC)   Essential hypertension   Protein-calorie malnutrition, severe   Discharge Instructions  Discharge Instructions    AMB Referral to Cardiac Rehabilitation - Phase II   Complete by: As directed    Diagnosis:  STEMI Coronary Stents     After initial evaluation and assessments completed: Virtual Based Care may be provided alone or in conjunction with Phase 2 Cardiac Rehab based on patient barriers.: Yes   Diet - low sodium heart healthy   Complete by: As directed    Discharge instructions   Complete by: As directed    It was pleasure taking care of you. Your cardiologist made some changes to your medications, please take it according to your new prescription and follow-up very closely with them for further recommendations. Avoid any activity which can put strain on your heart and participate with cardiac rehab.   Increase activity slowly   Complete by: As directed      Allergies as of 11/30/2020   No Known Allergies     Medication List    STOP taking these medications   amLODipine 5 MG tablet Commonly known as: NORVASC   diphenhydrAMINE 12.5 MG/5ML elixir Commonly known as: BENADRYL   loperamide 2 MG tablet Commonly known as: IMODIUM A-D   ondansetron 4 MG disintegrating tablet Commonly known as: Zofran ODT     TAKE these medications   aspirin 81 MG EC tablet Take 1 tablet (81 mg total) by mouth daily. Swallow whole.   atorvastatin 80 MG tablet Commonly known as: LIPITOR Take 1 tablet (80 mg total) by mouth daily.  What changed:   medication strength  how much to take   famotidine 20 MG tablet Commonly known as: PEPCID Take 1 tablet (20 mg total) by mouth 2 (two) times daily.   feeding supplement Liqd Take 237 mLs by mouth 3 (three) times daily between meals.   metoprolol succinate 25 MG 24 hr tablet Commonly known as:  TOPROL-XL Take 0.5 tablets (12.5 mg total) by mouth daily. Start taking on: December 01, 2020   multivitamin with minerals Tabs tablet Take 1 tablet by mouth daily.   nitroGLYCERIN 0.4 MG SL tablet Commonly known as: NITROSTAT Place 1 tablet (0.4 mg total) under the tongue every 5 (five) minutes x 3 doses as needed for chest pain.   ticagrelor 90 MG Tabs tablet Commonly known as: BRILINTA Take 1 tablet (90 mg total) by mouth 2 (two) times daily.       Follow-up Information    Paraschos, Alexander, MD. Schedule an appointment as soon as possible for a visit in 1 week(s).   Specialty: Cardiology Contact information: [address] [facility] [place] [address] [phone]              No Known Allergies  Consultations:  Cardiology  Procedures/Studies: CARDIAC CATHETERIZATION  Result Date: 11/28/2020  Prox RCA-1 lesion is 30% stenosed.  Prox RCA-2 lesion is 20% stenosed.  3rd Mrg lesion is 75% stenosed.  Prox LAD to Mid LAD lesion is 99% stenosed.  A drug-eluting stent was successfully placed using a STENT RESOLUTE ONYX [id number].  Post intervention, there is a 0% residual stenosis.  There is moderate (3+) mitral regurgitation.  1.  Anterior STEMI 2.  Two-vessel coronary artery disease with ulcerated 99% stenosis proximal/mid LAD and 75% stenosis OM 3 3.  Mildly reduced left ventricular function with anteroapical akinesis 4.  Moderate to severe mitral regurgitation 5.  Successful primary PCI with DES proximal/mid LAD Recommendations 1.  Dual antiplatelet therapy uninterrupted for 1 year 2.  High intensity atorvastatin 80 mg daily 3.  Metoprolol tartrate 25 mg twice daily 4.  2D echocardiogram   DG Chest Portable 1 View  Result Date: 11/28/2020 CLINICAL DATA:  ST elevation myocardial infarction EXAM: PORTABLE CHEST 1 VIEW COMPARISON:  05/15/2020 FINDINGS: Hyperinflation and interstitial coarsening with biapical architectural distortion. There is  hazy density bilaterally, nodule not excluded. Artifact from EKG leads and defibrillator pads. Normal heart size and stable aortic contours. IMPRESSION: 1. Recommend follow-up chest CT to evaluate hazy density at the apices. 2. Emphysema. 3. No acute finding related to the history of myocardial infarction. Electronically Signed   By: Marnee Spring M.D.   On: 11/28/2020 05:19   ECHOCARDIOGRAM COMPLETE  Result Date: 11/28/2020    ECHOCARDIOGRAM REPORT   Patient Name:   Peter Page Date of Exam: 11/28/2020 Medical Rec #:  [id number]       Height:       69.0 in Accession #:    [id number]      Weight:       122.4 lb Date of Birth:  [birth year]       BSA:          1.677 m Patient Age:    60 years        BP:           129/76 mmHg Patient Gender: M               HR:           61 bpm.  Exam Location:  [facility] Procedure: 2D Echo and Strain Analysis Indications:     AMI I21.9  History:         Patient has prior history of Echocardiogram examinations, most                  recent 05/17/2020.  Sonographer:     Overton Mam RDCS Referring Phys:  [id number] Lyn Hollingshead PARASCHOS Diagnosing Phys: Marcina Millard MD  Sonographer Comments: Global longitudinal strain was attempted. IMPRESSIONS  1. Left ventricular ejection fraction, by estimation, is 40 to 45%. The left ventricle has mildly decreased function. The left ventricle demonstrates regional wall motion abnormalities (see scoring diagram/findings for description). Left ventricular diastolic parameters were normal.  2. Right ventricular systolic function is normal. The right ventricular size is normal.  3. The mitral valve is normal in structure. Mild mitral valve regurgitation. No evidence of mitral stenosis.  4. Tricuspid valve regurgitation is mild to moderate.  5. The aortic valve is normal in structure. Aortic valve regurgitation is not visualized. No aortic stenosis is present.  6. The inferior vena cava is normal in size with greater than 50% respiratory  variability, suggesting right atrial pressure of 3 mmHg. FINDINGS  Left Ventricle: Left ventricular ejection fraction, by estimation, is 40 to 45%. The left ventricle has mildly decreased function. The left ventricle demonstrates regional wall motion abnormalities. The left ventricular internal cavity size was normal in size. There is no left ventricular hypertrophy. Left ventricular diastolic parameters were normal.  LV Wall Scoring: The entire apex is akinetic. Right Ventricle: The right ventricular size is normal. No increase in right ventricular wall thickness. Right ventricular systolic function is normal. Left Atrium: Left atrial size was normal in size. Right Atrium: Right atrial size was normal in size. Pericardium: There is no evidence of pericardial effusion. Mitral Valve: The mitral valve is normal in structure. Mild mitral valve regurgitation. No evidence of mitral valve stenosis. Tricuspid Valve: The tricuspid valve is normal in structure. Tricuspid valve regurgitation is mild to moderate. No evidence of tricuspid stenosis. Aortic Valve: The aortic valve is normal in structure. Aortic valve regurgitation is not visualized. No aortic stenosis is present. Aortic valve peak gradient measures 3.6 mmHg. Pulmonic Valve: The pulmonic valve was normal in structure. Pulmonic valve regurgitation is not visualized. No evidence of pulmonic stenosis. Aorta: The aortic root is normal in size and structure. Venous: The inferior vena cava is normal in size with greater than 50% respiratory variability, suggesting right atrial pressure of 3 mmHg. IAS/Shunts: No atrial level shunt detected by color flow Doppler.  LEFT VENTRICLE PLAX 2D LVIDd:         4.34 cm     Diastology LVIDs:         3.37 cm     LV e' medial:    7.51 cm/s LV PW:         1.07 cm     LV E/e' medial:  11.0 LV IVS:        0.91 cm     LV e' lateral:   9.36 cm/s LVOT diam:     2.20 cm     LV E/e' lateral: 8.8 LV SV:         55 LV SV Index:   33 LVOT Area:      3.80 cm  LV Volumes (MOD) LV vol d, MOD A2C: 75.2 ml LV vol d, MOD A4C: 92.0 ml LV vol s, MOD A2C: 36.9 ml LV vol s, MOD  A4C: 45.1 ml LV SV MOD A2C:     38.3 ml LV SV MOD A4C:     92.0 ml LV SV MOD BP:      48.7 ml RIGHT VENTRICLE RV Basal diam:  2.99 cm RV S prime:     8.49 cm/s TAPSE (M-mode): 1.5 cm LEFT ATRIUM             Index       RIGHT ATRIUM          Index LA diam:        3.40 cm 2.03 cm/m  RA Area:     7.82 cm LA Vol (A2C):   30.0 ml 17.89 ml/m RA Volume:   14.10 ml 8.41 ml/m LA Vol (A4C):   25.6 ml 15.27 ml/m LA Biplane Vol: 30.1 ml 17.95 ml/m  AORTIC VALVE                PULMONIC VALVE AV Area (Vmax): 2.90 cm    PV Vmax:       0.76 m/s AV Vmax:        95.00 cm/s  PV Peak grad:  2.3 mmHg AV Peak Grad:   3.6 mmHg LVOT Vmax:      72.50 cm/s LVOT Vmean:     47.400 cm/s LVOT VTI:       0.145 m  AORTA Ao Root diam: 3.20 cm MITRAL VALVE               TRICUSPID VALVE MV Area (PHT): 4.19 cm    TV Peak grad:   23.3 mmHg MV Decel Time: 181 msec    TV Vmax:        2.42 m/s MV E velocity: 82.30 cm/s MV A velocity: 51.00 cm/s  SHUNTS MV E/A ratio:  1.61        Systemic VTI:  0.14 m                            Systemic Diam: 2.20 cm Marcina Millard MD Electronically signed by Marcina Millard MD Signature Date/Time: 11/28/2020/1:19:07 PM    Final      Subjective: Patient was seen and examined today.  Denies any chest pain or shortness of breath.  Able to ambulate without any difficulty or chest discomfort.  Discharge Exam: Vitals:   11/30/20 1000 11/30/20 1100  BP: 94/76 100/89  Pulse: 72 74  Resp: (!) 24 20  Temp:    SpO2: 96% (!) 88%   Vitals:   11/30/20 0900 11/30/20 0937 11/30/20 1000 11/30/20 1100  BP: (!) 88/73 (!) 88/73 94/76 100/89  Pulse: 91 86 72 74  Resp: (!) 21  (!) 24 20  Temp:      TempSrc:      SpO2: 93%  96% (!) 88%  Weight:      Height:        General: Pt is alert, awake, not in acute distress Cardiovascular: RRR, S1/S2 +, no rubs, no gallops Respiratory:  CTA bilaterally, no wheezing, no rhonchi Abdominal: Soft, NT, ND, bowel sounds + Extremities: no edema, no cyanosis   The results of significant diagnostics from this hospitalization (including imaging, microbiology, ancillary and laboratory) are listed below for reference.    Microbiology: Recent Results (from the past 240 hour(s))  Resp Panel by RT-PCR (Flu A&B, Covid) Nasopharyngeal Swab     Status: None   Collection Time: 11/28/20  5:08 AM   Specimen: Nasopharyngeal Swab; Nasopharyngeal(NP)  swabs in vial transport medium  Result Value Ref Range Status   SARS Coronavirus 2 by RT PCR NEGATIVE NEGATIVE Final    Comment: (NOTE) SARS-CoV-2 target nucleic acids are NOT DETECTED.  The SARS-CoV-2 RNA is generally detectable in upper respiratory specimens during the acute phase of infection. The lowest concentration of SARS-CoV-2 viral copies this assay can detect is 138 copies/mL. A negative result does not preclude SARS-Cov-2 infection and should not be used as the sole basis for treatment or other patient management decisions. A negative result may occur with  improper specimen collection/handling, submission of specimen other than nasopharyngeal swab, presence of viral mutation(s) within the areas targeted by this assay, and inadequate number of viral copies(<138 copies/mL). A negative result must be combined with clinical observations, patient history, and epidemiological information. The expected result is Negative.  Fact Sheet for Patients:  BloggerCourse.com  Fact Sheet for Healthcare Providers:  SeriousBroker.it  This test is no t yet approved or cleared by the [place] FDA and  has been authorized for detection and/or diagnosis of SARS-CoV-2 by FDA under an Emergency Use Authorization (EUA). This EUA will remain  in effect (meaning this test can be used) for the duration of the COVID-19 declaration under Section  564(b)(1) of the Act, 21 U.S.C.section 360bbb-3(b)(1), unless the authorization is terminated  or revoked sooner.       Influenza A by PCR NEGATIVE NEGATIVE Final   Influenza B by PCR NEGATIVE NEGATIVE Final    Comment: (NOTE) The Xpert Xpress SARS-CoV-2/FLU/RSV plus assay is intended as an aid in the diagnosis of influenza from Nasopharyngeal swab specimens and should not be used as a sole basis for treatment. Nasal washings and aspirates are unacceptable for Xpert Xpress SARS-CoV-2/FLU/RSV testing.  Fact Sheet for Patients: BloggerCourse.com  Fact Sheet for Healthcare Providers: SeriousBroker.it  This test is not yet approved or cleared by the [place] FDA and has been authorized for detection and/or diagnosis of SARS-CoV-2 by FDA under an Emergency Use Authorization (EUA). This EUA will remain in effect (meaning this test can be used) for the duration of the COVID-19 declaration under Section 564(b)(1) of the Act, 21 U.S.C. section 360bbb-3(b)(1), unless the authorization is terminated or revoked.  Performed at [facility], [address] [place], [place] [address]   MRSA PCR Screening     Status: None   Collection Time: 11/28/20  6:55 AM   Specimen: Nasal Mucosa; Nasopharyngeal  Result Value Ref Range Status   MRSA by PCR NEGATIVE NEGATIVE Final    Comment:        The GeneXpert MRSA Assay (FDA approved for NASAL specimens only), is one component of a comprehensive MRSA colonization surveillance program. It is not intended to diagnose MRSA infection nor to guide or monitor treatment for MRSA infections. Performed at [facility], [address] [place], [place] [address]      Labs: BNP (last 3 results) No results for input(s): BNP in the last 8760 hours. Basic Metabolic Panel: Recent Labs  Lab 11/28/20 0521 11/29/20 0436 11/30/20 0430  NA 141 139 135  K 4.1 3.8 3.7  CL 108  112* 106  CO2 20* 19* 22  GLUCOSE 181* 83 96  BUN 15 14 13   CREATININE 1.16 1.04 0.89  CALCIUM 9.0 8.1* 8.2*   Liver Function Tests: No results for input(s): AST, ALT, ALKPHOS, BILITOT, PROT, ALBUMIN in the last 168 hours. No results for input(s): LIPASE, AMYLASE in the last 168 hours. No results  for input(s): AMMONIA in the last 168 hours. CBC: Recent Labs  Lab 11/28/20 0521 11/29/20 0436 11/30/20 0430  WBC 13.3* 10.5 8.8  HGB 15.7 13.7 14.6  HCT 46.7 40.0 42.7  MCV 92.8 90.3 88.2  PLT 353 257 235   Cardiac Enzymes: No results for input(s): CKTOTAL, CKMB, CKMBINDEX, TROPONINI in the last 168 hours. BNP: Invalid input(s): POCBNP CBG: Recent Labs  Lab 11/28/20 0654 11/28/20 1634  GLUCAP 110* 94   D-Dimer No results for input(s): DDIMER in the last 72 hours. Hgb A1c No results for input(s): HGBA1C in the last 72 hours. Lipid Profile Recent Labs    11/29/20 0436  CHOL 150  HDL 38*  LDLCALC 98  TRIG 68  CHOLHDL 3.9   Thyroid function studies No results for input(s): TSH, T4TOTAL, T3FREE, THYROIDAB in the last 72 hours.  Invalid input(s): FREET3 Anemia work up No results for input(s): VITAMINB12, FOLATE, FERRITIN, TIBC, IRON, RETICCTPCT in the last 72 hours. Urinalysis    Component Value Date/Time   COLORURINE YELLOW (A) 05/16/2020 0235   APPEARANCEUR HAZY (A) 05/16/2020 0235   LABSPEC 1.028 05/16/2020 0235   PHURINE 6.0 05/16/2020 0235   GLUCOSEU NEGATIVE 05/16/2020 0235   HGBUR NEGATIVE 05/16/2020 0235   BILIRUBINUR NEGATIVE 05/16/2020 0235   KETONESUR NEGATIVE 05/16/2020 0235   PROTEINUR NEGATIVE 05/16/2020 0235   NITRITE NEGATIVE 05/16/2020 0235   LEUKOCYTESUR NEGATIVE 05/16/2020 0235   Sepsis Labs Invalid input(s): PROCALCITONIN,  WBC,  LACTICIDVEN Microbiology Recent Results (from the past 240 hour(s))  Resp Panel by RT-PCR (Flu A&B, Covid) Nasopharyngeal Swab     Status: None   Collection Time: 11/28/20  5:08 AM   Specimen: Nasopharyngeal  Swab; Nasopharyngeal(NP) swabs in vial transport medium  Result Value Ref Range Status   SARS Coronavirus 2 by RT PCR NEGATIVE NEGATIVE Final    Comment: (NOTE) SARS-CoV-2 target nucleic acids are NOT DETECTED.  The SARS-CoV-2 RNA is generally detectable in upper respiratory specimens during the acute phase of infection. The lowest concentration of SARS-CoV-2 viral copies this assay can detect is 138 copies/mL. A negative result does not preclude SARS-Cov-2 infection and should not be used as the sole basis for treatment or other patient management decisions. A negative result may occur with  improper specimen collection/handling, submission of specimen other than nasopharyngeal swab, presence of viral mutation(s) within the areas targeted by this assay, and inadequate number of viral copies(<138 copies/mL). A negative result must be combined with clinical observations, patient history, and epidemiological information. The expected result is Negative.  Fact Sheet for Patients:  BloggerCourse.com  Fact Sheet for Healthcare Providers:  SeriousBroker.it  This test is no t yet approved or cleared by the [place] FDA and  has been authorized for detection and/or diagnosis of SARS-CoV-2 by FDA under an Emergency Use Authorization (EUA). This EUA will remain  in effect (meaning this test can be used) for the duration of the COVID-19 declaration under Section 564(b)(1) of the Act, 21 U.S.C.section 360bbb-3(b)(1), unless the authorization is terminated  or revoked sooner.       Influenza A by PCR NEGATIVE NEGATIVE Final   Influenza B by PCR NEGATIVE NEGATIVE Final    Comment: (NOTE) The Xpert Xpress SARS-CoV-2/FLU/RSV plus assay is intended as an aid in the diagnosis of influenza from Nasopharyngeal swab specimens and should not be used as a sole basis for treatment. Nasal washings and aspirates are unacceptable for Xpert Xpress  SARS-CoV-2/FLU/RSV testing.  Fact Sheet for Patients: BloggerCourse.com  Fact  Sheet for Healthcare Providers: SeriousBroker.it  This test is not yet approved or cleared by the [place] and has been authorized for detection and/or diagnosis of SARS-CoV-2 by FDA under an Emergency Use Authorization (EUA). This EUA will remain in effect (meaning this test can be used) for the duration of the COVID-19 declaration under Section 564(b)(1) of the Act, 21 U.S.C. section 360bbb-3(b)(1), unless the authorization is terminated or revoked.  Performed at [facility], [address] [place], [place] [address]   MRSA PCR Screening     Status: None   Collection Time: 11/28/20  6:55 AM   Specimen: Nasal Mucosa; Nasopharyngeal  Result Value Ref Range Status   MRSA by PCR NEGATIVE NEGATIVE Final    Comment:        The GeneXpert MRSA Assay (FDA approved for NASAL specimens only), is one component of a comprehensive MRSA colonization surveillance program. It is not intended to diagnose MRSA infection nor to guide or monitor treatment for MRSA infections. Performed at [facility], [address] [place], [place] [address]     Time coordinating discharge: Over 30 minutes  SIGNED:  Arnetha Courser, MD  [facility] 11/30/2020, 12:40 PM  If 7PM-7AM, please contact night-coverage [email or web address]  This record has been created using Conservation officer, historic buildings. Errors have been sought and corrected,but may not always be located. Such creation errors do not reflect on the standard of care.

## 2020-11-30 NOTE — Progress Notes (Signed)
Initial Nutrition Assessment  DOCUMENTATION CODES:  Underweight,Severe malnutrition in context of social or environmental circumstances  INTERVENTION:   Recommend adjusting to regular diet for poor intake (28% average), underweight BMI, and severe malnutrition. MD prefers heart healthy diet at this time.  Ensure Enlive po TID, each supplement provides 350 kcal and 20 grams of protein  Magic cup TID with meals, each supplement provides 290 kcal and 9 grams of protein  MVI with minerals daily  NUTRITION DIAGNOSIS:  Severe Malnutrition (in the context of social/environmental factors) related to other (see comment) (inadequate oral intake) as evidenced by severe muscle depletion,severe fat depletion.  GOAL:  Patient will meet greater than or equal to 90% of their needs  MONITOR:  PO intake,Supplement acceptance,Weight trends  REASON FOR ASSESSMENT:  Malnutrition Screening Tool    ASSESSMENT:  Pt presented to ED from home with severe bilateral arm pain accompanied by nausea and SOB. Code STEMI called in field by EMS. Pt emergently taken to cath lab on arrival to ED. PMH relevant for asthma, HTN, Hx CVA, and COPD.  Pt resting in bed at the time of visit. Reports that he has not been eating well, does not like the food he is receiving. Currently on [employer], will recommend liberalizing as pt's lipid anal was normal. Pt reports at home, he only eats one meal a day (supper). States he may snack during the day, but not always. Denies GI distress today or trouble chewing/swallowing. Lives at home with his child/spouse and grandchild. Hopeful to be discharged later today.  Significant fat and muscle deficits present on physical exam. Denies recent weight loss, and states usual is about 130-135 lbs. Noted 4.3% weight loss over the last 6 months, which is not severe. Pt is underweight for height. Pt reports that he has had nutrition supplements in the past, ok with receiving while he is  admitted.     Average Meal Intake: . 5/30-6/1: 28% intake x 2 recorded meals  Nutritionally Relevant Medications Scheduled Meds: . atorvastatin  80 mg Oral QPM  . famotidine  20 mg Oral Daily   PRN Meds: ondansetron  Labs reviewed  NUTRITION - FOCUSED PHYSICAL EXAM: Flowsheet Row Most Recent Value  Orbital Region Severe depletion  Upper Arm Region Severe depletion  Thoracic and Lumbar Region Severe depletion  Buccal Region Severe depletion  Temple Region Severe depletion  Clavicle Bone Region Severe depletion  Clavicle and Acromion Bone Region Severe depletion  Scapular Bone Region Moderate depletion  Dorsal Hand Moderate depletion  Patellar Region Moderate depletion  Anterior Thigh Region Moderate depletion  Posterior Calf Region Moderate depletion  Edema (RD Assessment) None  Hair Reviewed  Eyes Reviewed  Mouth Reviewed  Skin Reviewed  Nails Reviewed     Diet Order:   Diet Order            Diet Heart Room service appropriate? Yes; Fluid consistency: Thin  Diet effective now                 EDUCATION NEEDS:  No education needs have been identified at this time  Skin:  Skin Assessment: Reviewed RN Assessment (ecchymosis to the bilateral arms and legs)  Last BM:  PTA per RN documentation  Height:  Ht Readings from Last 1 Encounters:  11/28/20 5\' 9"  (1.753 m)    Weight:  Wt Readings from Last 1 Encounters:  11/28/20 55.5 kg    Ideal Body Weight:  72.7 kg  BMI:  Body mass index  is 18.07 kg/m.  Estimated Nutritional Needs:   Kcal:  1800-2000 kcal/d  Protein:  90-100 g/d  Fluid:  >1800 mL/d  Greig Castilla, RD, LDN Clinical Dietitian Pager on Amion

## 2020-12-03 ENCOUNTER — Encounter
Admission: EM | Disposition: A | Discharge status: Home / Self Care | Payer: Self-pay | Source: Home / Self Care | Attending: Internal Medicine

## 2020-12-03 ENCOUNTER — Emergency Department: Payer: Medicare Other

## 2020-12-03 ENCOUNTER — Other Ambulatory Visit: Payer: Self-pay

## 2020-12-03 ENCOUNTER — Inpatient Hospital Stay
Admission: EM | Admit: 2020-12-03 | Discharge: 2020-12-06 | DRG: 250 | Disposition: A | Discharge status: Home / Self Care | Payer: Medicare Other | Attending: Internal Medicine | Admitting: Internal Medicine

## 2020-12-03 DIAGNOSIS — T82867A Thrombosis of cardiac prosthetic devices, implants and grafts, initial encounter: Secondary | ICD-10-CM

## 2020-12-03 DIAGNOSIS — E43 Unspecified severe protein-calorie malnutrition: Secondary | ICD-10-CM | POA: Diagnosis present

## 2020-12-03 DIAGNOSIS — K219 Gastro-esophageal reflux disease without esophagitis: Secondary | ICD-10-CM | POA: Diagnosis present

## 2020-12-03 DIAGNOSIS — I97190 Other postprocedural cardiac functional disturbances following cardiac surgery: Secondary | ICD-10-CM | POA: Diagnosis present

## 2020-12-03 DIAGNOSIS — Z8249 Family history of ischemic heart disease and other diseases of the circulatory system: Secondary | ICD-10-CM

## 2020-12-03 DIAGNOSIS — Z7982 Long term (current) use of aspirin: Secondary | ICD-10-CM | POA: Diagnosis not present

## 2020-12-03 DIAGNOSIS — Z955 Presence of coronary angioplasty implant and graft: Secondary | ICD-10-CM | POA: Diagnosis not present

## 2020-12-03 DIAGNOSIS — Y712 Prosthetic and other implants, materials and accessory cardiovascular devices associated with adverse incidents: Secondary | ICD-10-CM | POA: Diagnosis present

## 2020-12-03 DIAGNOSIS — I2510 Atherosclerotic heart disease of native coronary artery without angina pectoris: Secondary | ICD-10-CM | POA: Diagnosis not present

## 2020-12-03 DIAGNOSIS — Z8673 Personal history of transient ischemic attack (TIA), and cerebral infarction without residual deficits: Secondary | ICD-10-CM | POA: Diagnosis not present

## 2020-12-03 DIAGNOSIS — Z87891 Personal history of nicotine dependence: Secondary | ICD-10-CM | POA: Diagnosis not present

## 2020-12-03 DIAGNOSIS — J449 Chronic obstructive pulmonary disease, unspecified: Secondary | ICD-10-CM | POA: Diagnosis present

## 2020-12-03 DIAGNOSIS — R54 Age-related physical debility: Secondary | ICD-10-CM | POA: Diagnosis present

## 2020-12-03 DIAGNOSIS — T82897A Other specified complication of cardiac prosthetic devices, implants and grafts, initial encounter: Secondary | ICD-10-CM

## 2020-12-03 DIAGNOSIS — E785 Hyperlipidemia, unspecified: Secondary | ICD-10-CM | POA: Diagnosis present

## 2020-12-03 DIAGNOSIS — I2109 ST elevation (STEMI) myocardial infarction involving other coronary artery of anterior wall: Secondary | ICD-10-CM | POA: Diagnosis present

## 2020-12-03 DIAGNOSIS — R079 Chest pain, unspecified: Secondary | ICD-10-CM | POA: Diagnosis not present

## 2020-12-03 DIAGNOSIS — I2102 ST elevation (STEMI) myocardial infarction involving left anterior descending coronary artery: Secondary | ICD-10-CM | POA: Diagnosis not present

## 2020-12-03 DIAGNOSIS — Z79899 Other long term (current) drug therapy: Secondary | ICD-10-CM

## 2020-12-03 DIAGNOSIS — I10 Essential (primary) hypertension: Secondary | ICD-10-CM | POA: Diagnosis present

## 2020-12-03 DIAGNOSIS — Z20822 Contact with and (suspected) exposure to covid-19: Secondary | ICD-10-CM | POA: Diagnosis present

## 2020-12-03 DIAGNOSIS — Z681 Body mass index (BMI) 19 or less, adult: Secondary | ICD-10-CM

## 2020-12-03 DIAGNOSIS — I21A9 Other myocardial infarction type: Secondary | ICD-10-CM | POA: Diagnosis present

## 2020-12-03 DIAGNOSIS — I213 ST elevation (STEMI) myocardial infarction of unspecified site: Secondary | ICD-10-CM | POA: Diagnosis present

## 2020-12-03 HISTORY — PX: CORONARY/GRAFT ACUTE MI REVASCULARIZATION: CATH118305

## 2020-12-03 HISTORY — PX: INTRAVASCULAR ULTRASOUND/IVUS: CATH118244

## 2020-12-03 HISTORY — PX: LEFT HEART CATH AND CORONARY ANGIOGRAPHY: CATH118249

## 2020-12-03 LAB — BASIC METABOLIC PANEL
Anion gap: 13 (ref 5–15)
BUN: 17 mg/dL (ref 8–23)
CO2: 19 mmol/L — ABNORMAL LOW (ref 22–32)
Calcium: 9.1 mg/dL (ref 8.9–10.3)
Chloride: 108 mmol/L (ref 98–111)
Creatinine, Ser: 1.09 mg/dL (ref 0.61–1.24)
GFR, Estimated: >60 mL/min (ref >60)
Glucose, Bld: 131 mg/dL — ABNORMAL HIGH (ref 70–99)
Potassium: 3.8 mmol/L (ref 3.5–5.1)
Sodium: 140 mmol/L (ref 135–145)

## 2020-12-03 LAB — RESP PANEL BY RT-PCR (FLU A&B, COVID) ARPGX2
Influenza A by PCR: NEGATIVE
Influenza B by PCR: NEGATIVE
SARS Coronavirus 2 by RT PCR: NEGATIVE

## 2020-12-03 LAB — CBC
HCT: 43.8 % (ref 39.0–52.0)
Hemoglobin: 14.9 g/dL (ref 13.0–17.0)
MCH: 31.1 pg (ref 26.0–34.0)
MCHC: 34 g/dL (ref 30.0–36.0)
MCV: 91.4 fL (ref 80.0–100.0)
Platelets: 326 10*3/uL (ref 150–400)
RBC: 4.79 MIL/uL (ref 4.22–5.81)
RDW: 13.7 % (ref 11.5–15.5)
WBC: 9.6 10*3/uL (ref 4.0–10.5)
nRBC: 0 % (ref 0.0–0.2)

## 2020-12-03 LAB — POCT ACTIVATED CLOTTING TIME
Activated Clotting Time: 309 seconds
Activated Clotting Time: 368 seconds

## 2020-12-03 LAB — GLUCOSE, CAPILLARY: Glucose-Capillary: 146 mg/dL — ABNORMAL HIGH (ref 70–99)

## 2020-12-03 LAB — TROPONIN I (HIGH SENSITIVITY): Troponin I (High Sensitivity): 776 ng/L (ref <18)

## 2020-12-03 SURGERY — CORONARY/GRAFT ACUTE MI REVASCULARIZATION

## 2020-12-03 MED ORDER — HEPARIN (PORCINE) IN NACL 1000-0.9 UT/500ML-% IV SOLN
INTRAVENOUS | Status: AC
  Filled 2020-12-03: qty 1000

## 2020-12-03 MED ORDER — HEPARIN SODIUM (PORCINE) 5000 UNIT/ML IJ SOLN
60.0000 [IU]/kg | Freq: Once | INTRAMUSCULAR | Status: AC
  Administered 2020-12-03: 3200 [IU] via INTRAVENOUS

## 2020-12-03 MED ORDER — FENTANYL CITRATE (PF) 100 MCG/2ML IJ SOLN
INTRAMUSCULAR | Status: DC | PRN
  Administered 2020-12-03: 25 ug via INTRAVENOUS

## 2020-12-03 MED ORDER — NITROGLYCERIN 1 MG/10 ML FOR IR/CATH LAB
INTRA_ARTERIAL | Status: DC | PRN
  Administered 2020-12-03 (×2): 200 ug via INTRACORONARY

## 2020-12-03 MED ORDER — TIROFIBAN HCL IN NACL 5-0.9 MG/100ML-% IV SOLN
INTRAVENOUS | Status: AC
  Filled 2020-12-03: qty 100

## 2020-12-03 MED ORDER — SODIUM CHLORIDE 0.9% FLUSH
3.0000 mL | Freq: Two times a day (BID) | INTRAVENOUS | Status: DC
  Administered 2020-12-03 – 2020-12-06 (×6): 3 mL via INTRAVENOUS

## 2020-12-03 MED ORDER — ACETAMINOPHEN 325 MG PO TABS: 650.0000 mg | ORAL_TABLET | ORAL | Status: DC | PRN

## 2020-12-03 MED ORDER — TICAGRELOR 90 MG PO TABS: 90.0000 mg | ORAL_TABLET | Freq: Two times a day (BID) | ORAL | Status: DC

## 2020-12-03 MED ORDER — ASPIRIN 81 MG PO CHEW
81.0000 mg | CHEWABLE_TABLET | Freq: Every day | ORAL | Status: DC
  Administered 2020-12-04 – 2020-12-06 (×3): 81 mg via ORAL
  Filled 2020-12-03 (×3): qty 1

## 2020-12-03 MED ORDER — HEPARIN SODIUM (PORCINE) 1000 UNIT/ML IJ SOLN
INTRAMUSCULAR | Status: DC | PRN
  Administered 2020-12-03: 6000 [IU] via INTRAVENOUS

## 2020-12-03 MED ORDER — ONDANSETRON HCL 4 MG/2ML IJ SOLN: 4.0000 mg | Freq: Four times a day (QID) | INTRAMUSCULAR | Status: DC | PRN

## 2020-12-03 MED ORDER — HEPARIN SODIUM (PORCINE) 1000 UNIT/ML IJ SOLN
INTRAMUSCULAR | Status: AC
  Filled 2020-12-03: qty 1

## 2020-12-03 MED ORDER — ADULT MULTIVITAMIN W/MINERALS CH
1.0000 | ORAL_TABLET | Freq: Every day | ORAL | Status: DC
  Administered 2020-12-04 – 2020-12-05 (×2): 1 via ORAL
  Filled 2020-12-03 (×3): qty 1

## 2020-12-03 MED ORDER — FUROSEMIDE 10 MG/ML IJ SOLN
INTRAMUSCULAR | Status: DC | PRN
  Administered 2020-12-03: 40 mg via INTRAVENOUS

## 2020-12-03 MED ORDER — TICAGRELOR 90 MG PO TABS
ORAL_TABLET | ORAL | Status: DC | PRN
Start: 2020-12-03 — End: 2020-12-03
  Administered 2020-12-03: 180 mg via ORAL

## 2020-12-03 MED ORDER — MORPHINE SULFATE (PF) 4 MG/ML IV SOLN: 4.0000 mg | Freq: Once | INTRAVENOUS | Status: AC

## 2020-12-03 MED ORDER — TIROFIBAN HCL IV 12.5 MG/250 ML
INTRAVENOUS | Status: DC | PRN
  Administered 2020-12-03: 0.15 ug/kg/min via INTRAVENOUS

## 2020-12-03 MED ORDER — METOPROLOL SUCCINATE ER 25 MG PO TB24
12.5000 mg | ORAL_TABLET | Freq: Every day | ORAL | Status: DC
  Filled 2020-12-03: qty 0.5

## 2020-12-03 MED ORDER — VERAPAMIL HCL 2.5 MG/ML IV SOLN
INTRAVENOUS | Status: AC
  Filled 2020-12-03: qty 2

## 2020-12-03 MED ORDER — SODIUM CHLORIDE 0.9 % IV SOLN: INTRAVENOUS | Status: DC

## 2020-12-03 MED ORDER — MIDAZOLAM HCL 2 MG/2ML IJ SOLN
INTRAMUSCULAR | Status: DC | PRN
  Administered 2020-12-03: 1 mg via INTRAVENOUS

## 2020-12-03 MED ORDER — LIDOCAINE HCL (PF) 1 % IJ SOLN
INTRAMUSCULAR | Status: DC | PRN
  Administered 2020-12-03: 2 mL

## 2020-12-03 MED ORDER — SODIUM CHLORIDE 0.9% FLUSH: 3.0000 mL | INTRAVENOUS | Status: DC | PRN

## 2020-12-03 MED ORDER — LIDOCAINE HCL (PF) 1 % IJ SOLN
INTRAMUSCULAR | Status: AC
  Filled 2020-12-03: qty 30

## 2020-12-03 MED ORDER — FENTANYL CITRATE (PF) 100 MCG/2ML IJ SOLN
INTRAMUSCULAR | Status: AC
  Filled 2020-12-03: qty 2

## 2020-12-03 MED ORDER — FAMOTIDINE 20 MG PO TABS
20.0000 mg | ORAL_TABLET | Freq: Two times a day (BID) | ORAL | Status: DC
  Administered 2020-12-03 – 2020-12-05 (×4): 20 mg via ORAL
  Filled 2020-12-03 (×5): qty 1

## 2020-12-03 MED ORDER — MIDAZOLAM HCL 2 MG/2ML IJ SOLN
INTRAMUSCULAR | Status: AC
  Filled 2020-12-03: qty 2

## 2020-12-03 MED ORDER — ATORVASTATIN CALCIUM 80 MG PO TABS
80.0000 mg | ORAL_TABLET | Freq: Every day | ORAL | Status: DC
  Filled 2020-12-03: qty 1

## 2020-12-03 MED ORDER — ENSURE ENLIVE PO LIQD
237.0000 mL | Freq: Three times a day (TID) | ORAL | Status: DC
  Administered 2020-12-04: 237 mL via ORAL

## 2020-12-03 MED ORDER — MORPHINE SULFATE (PF) 4 MG/ML IV SOLN
INTRAVENOUS | Status: AC
  Administered 2020-12-03: 4 mg via INTRAVENOUS
  Filled 2020-12-03: qty 1

## 2020-12-03 MED ORDER — ONDANSETRON HCL 4 MG/2ML IJ SOLN
INTRAMUSCULAR | Status: AC
  Administered 2020-12-03: 4 mg via INTRAVENOUS
  Filled 2020-12-03: qty 2

## 2020-12-03 MED ORDER — LABETALOL HCL 5 MG/ML IV SOLN: 10.0000 mg | INTRAVENOUS | Status: AC | PRN

## 2020-12-03 MED ORDER — NITROGLYCERIN 0.4 MG SL SUBL: 0.4000 mg | SUBLINGUAL_TABLET | SUBLINGUAL | Status: DC | PRN

## 2020-12-03 MED ORDER — TIROFIBAN (AGGRASTAT) BOLUS VIA INFUSION
INTRAVENOUS | Status: DC | PRN
  Administered 2020-12-03: 1335 ug via INTRAVENOUS

## 2020-12-03 MED ORDER — ASPIRIN 81 MG PO TBEC: 81.0000 mg | DELAYED_RELEASE_TABLET | Freq: Every day | ORAL | Status: DC

## 2020-12-03 MED ORDER — NITROGLYCERIN 1 MG/10 ML FOR IR/CATH LAB
INTRA_ARTERIAL | Status: AC
  Filled 2020-12-03: qty 10

## 2020-12-03 MED ORDER — ONDANSETRON HCL 4 MG/2ML IJ SOLN: 4.0000 mg | Freq: Once | INTRAMUSCULAR | Status: AC

## 2020-12-03 MED ORDER — TICAGRELOR 90 MG PO TABS
ORAL_TABLET | ORAL | Status: AC
  Filled 2020-12-03: qty 2

## 2020-12-03 MED ORDER — SODIUM CHLORIDE 0.9 % IV SOLN: 250.0000 mL | INTRAVENOUS | Status: DC | PRN

## 2020-12-03 MED ORDER — IOHEXOL 300 MG/ML  SOLN
INTRAMUSCULAR | Status: DC | PRN
  Administered 2020-12-03: 100 mL via INTRA_ARTERIAL

## 2020-12-03 MED ORDER — TICAGRELOR 90 MG PO TABS
90.0000 mg | ORAL_TABLET | Freq: Two times a day (BID) | ORAL | Status: DC
  Administered 2020-12-04 – 2020-12-06 (×5): 90 mg via ORAL
  Filled 2020-12-03 (×5): qty 1

## 2020-12-03 MED ORDER — HEPARIN (PORCINE) IN NACL 1000-0.9 UT/500ML-% IV SOLN
INTRAVENOUS | Status: DC | PRN
  Administered 2020-12-03 (×2): 500 mL

## 2020-12-03 MED ORDER — HYDRALAZINE HCL 20 MG/ML IJ SOLN: 10.0000 mg | INTRAMUSCULAR | Status: AC | PRN

## 2020-12-03 MED ORDER — VERAPAMIL HCL 2.5 MG/ML IV SOLN: INTRAVENOUS | Status: DC | PRN

## 2020-12-03 MED ORDER — FUROSEMIDE 10 MG/ML IJ SOLN
INTRAMUSCULAR | Status: AC
  Filled 2020-12-03: qty 4

## 2020-12-03 SURGICAL SUPPLY — 20 items
BALLN ~~LOC~~ TREK RX 3.25X12 (BALLOONS) ×2
BALLN ~~LOC~~ TREK RX 3.75X12 (BALLOONS) ×2
BALLOON ~~LOC~~ TREK RX 3.25X12 (BALLOONS) IMPLANT
BALLOON ~~LOC~~ TREK RX 3.75X12 (BALLOONS) IMPLANT
CATH EAGLE EYE PLAT IMAGING (CATHETERS) ×1 IMPLANT
CATH EXTRAC PRONTO 5.5F 138CM (CATHETERS) ×1 IMPLANT
CATH INFINITI JR4 5F (CATHETERS) ×1 IMPLANT
CATH LAUNCHER 6FR EBU3.5 (CATHETERS) ×1 IMPLANT
DEVICE RAD TR BAND REGULAR (VASCULAR PRODUCTS) ×1 IMPLANT
DRAPE BRACHIAL (DRAPES) ×1 IMPLANT
GLIDESHEATH SLEND SS 6F .021 (SHEATH) ×1 IMPLANT
GUIDEWIRE INQWIRE 1.5J.035X260 (WIRE) IMPLANT
INQWIRE 1.5J .035X260CM (WIRE) ×2
KIT ENCORE 26 ADVANTAGE (KITS) ×1 IMPLANT
KIT MANI 3VAL PERCEP (MISCELLANEOUS) ×1 IMPLANT
PROTECTION STATION PRESSURIZED (MISCELLANEOUS) ×2
STATION PROTECTION PRESSURIZED (MISCELLANEOUS) IMPLANT
VALVE COPILOT STAT (MISCELLANEOUS) ×1 IMPLANT
WIRE ASAHI PROWATER 180CM (WIRE) ×1 IMPLANT
WIRE GUIDERIGHT .035X150 (WIRE) IMPLANT

## 2020-12-03 NOTE — Consult Note (Signed)
Cardiology Consultation:   Patient ID: Peter Page MRN: [id number]; DOB: 09-Apr-1961  Admit date: 12/03/2020 Date of Consult: 12/03/2020  PCP:  Patient, No Pcp Per (Inactive)   [facility] Providers Cardiologist:  Dr. Darrold Junker    Patient Profile:   Peter Page is a 60 y.o. male with a hx of CAD who is being seen 12/03/2020 for the evaluation of chest pain at the request of Dr. Derrill Kay.  History of Present Illness:   Peter Page has a history of coronary artery disease with recent anterior STEMI with LAD stent placement in May 2022.  Of note, he had some nausea in the hospital that he attributed to his medications.  He did state that he was taking his medications since hospital discharge.  He presented to the ER after developing chest discomfort while working on his car.  It was quite severe.  It was worse today than it was earlier in the week when he had his stent placed.   Past Medical History:  Diagnosis Date  . Asthma   . COPD (chronic obstructive pulmonary disease) (HCC)     Past Surgical History:  Procedure Laterality Date  . CORONARY/GRAFT ACUTE MI REVASCULARIZATION N/A 11/28/2020   Procedure: Coronary/Graft Acute MI Revascularization;  Surgeon: Marcina Millard, MD;  Location: [facility] INVASIVE CV LAB;  Service: Cardiovascular;  Laterality: N/A;  . LEFT HEART CATH AND CORONARY ANGIOGRAPHY N/A 11/28/2020   Procedure: LEFT HEART CATH AND CORONARY ANGIOGRAPHY;  Surgeon: Marcina Millard, MD;  Location: [facility] INVASIVE CV LAB;  Service: Cardiovascular;  Laterality: N/A;       Inpatient Medications: Scheduled Meds: . [START ON 12/04/2020] aspirin  81 mg Oral Daily  . [START ON 12/04/2020] atorvastatin  80 mg Oral Daily  . famotidine  20 mg Oral BID  . [START ON 12/04/2020] feeding supplement  237 mL Oral TID BM  . [START ON 12/04/2020] metoprolol succinate  12.5 mg Oral Daily  . [START ON 12/04/2020] multivitamin with minerals  1 tablet Oral Daily  . sodium chloride flush   3 mL Intravenous Q12H  . [START ON 12/04/2020] ticagrelor  90 mg Oral BID   Continuous Infusions: . sodium chloride 20 mL/hr at 12/03/20 2102  . sodium chloride     PRN Meds:   Allergies:   No Known Allergies  Social History:   Social History   Socioeconomic History  . Marital status: Single    Spouse name: Not on file  . Number of children: Not on file  . Years of education: Not on file  . Highest education level: Not on file  Occupational History  . Not on file  Tobacco Use  . Smoking status: Former Smoker    Types: Cigarettes    Quit date: 2020    Years since quitting: 2.4  . Smokeless tobacco: Never Used  Vaping Use  . Vaping Use: Former  Substance and Sexual Activity  . Alcohol use: Yes    Comment: "i did all night I wish I hadn't"  . Drug use: Yes    Types: Marijuana    Comment: daily  . Sexual activity: Not on file  Other Topics Concern  . Not on file  Social History Narrative  . Not on file   Social Determinants of Health   Financial Resource Strain: Not on file  Food Insecurity: Not on file  Transportation Needs: Not on file  Physical Activity: Not on file  Stress: Not on file  Social Connections: Not on file  Intimate Partner Violence: Not on file    Family History:    Family History  Problem Relation Age of Onset  . CAD Peter Page      ROS:  Please see the history of present illness.  Severe chest pain All other ROS reviewed and negative.     Physical Exam/Data:   Vitals:   12/03/20 2121 12/03/20 2135 12/03/20 2305 12/03/20 2317  BP: 132/70  111/82 99/84  Pulse: (!) 51  (!) 102 78  Resp: (!) 21  15 16   Temp:   (!) 97.4 F (36.3 C)   TempSrc:   Oral   SpO2: 99% 99% (!) 57% (!) 88%  Weight:      Height:       No intake or output data in the 24 hours ending 12/03/20 2332 Last 3 Weights 12/03/2020 11/28/2020 05/15/2020  Weight (lbs) 117 lb 11.2 oz 122 lb 5.7 oz 127 lb 13.9 oz  Weight (kg) 53.388 kg 55.5 kg 58 kg     Body mass index is  17.38 kg/m.  General:  Well nourished, well developed, in moderate distress HEENT: normal Lymph: no adenopathy Neck: no JVD Endocrine:  No thryomegaly Vascular: No carotid bruits; FA pulses 2+ bilaterally without bruits  Cardiac:  normal S1, S2; bradycardic; no murmur  Lungs:  clear to auscultation bilaterally, no wheezing, rhonchi or rales  Abd: soft, nontender, no hepatomegaly  Ext: no edema Musculoskeletal:  No deformities, BUE and BLE strength normal and equal; 2+ right radial pulse Skin: warm and dry ;  Hands were dirty Neuro:  CNs 2-12 intact, no focal abnormalities noted Psych: Anxious affect   EKG:  The EKG was personally reviewed and demonstrates: Sinus bradycardia with anterior ST elevation and inferior ST depression Telemetry:  Telemetry was personally reviewed and demonstrates: Sinus bradycardia  Relevant CV Studies: Prior cath films reviewed  Laboratory Data:  High Sensitivity Troponin:   Recent Labs  Lab 11/28/20 0521 11/28/20 1656 11/28/20 1830 12/03/20 2049  TROPONINIHS 22* >24,000* >24,000* 776*     Chemistry Recent Labs  Lab 11/29/20 0436 11/30/20 0430 12/03/20 2049  NA 139 135 140  K 3.8 3.7 3.8  CL 112* 106 108  CO2 19* 22 19*  GLUCOSE 83 96 131*  BUN 14 13 17   CREATININE 1.04 0.89 1.09  CALCIUM 8.1* 8.2* 9.1  GFRNONAA >60 >60 >60  ANIONGAP 8 7 13     No results for input(s): PROT, ALBUMIN, AST, ALT, ALKPHOS, BILITOT in the last 168 hours. Hematology Recent Labs  Lab 11/29/20 0436 11/30/20 0430 12/03/20 2049  WBC 10.5 8.8 9.6  RBC 4.43 4.84 4.79  HGB 13.7 14.6 14.9  HCT 40.0 42.7 43.8  MCV 90.3 88.2 91.4  MCH 30.9 30.2 31.1  MCHC 34.3 34.2 34.0  RDW 13.8 13.4 13.7  PLT 257 235 326   BNPNo results for input(s): BNP, PROBNP in the last 168 hours.  DDimer No results for input(s): DDIMER in the last 168 hours.   Radiology/Studies:  CARDIAC CATHETERIZATION  Result Date: 12/03/2020  3rd Mrg lesion is 75% stenosed.  Prox RCA-1  lesion is 30% stenosed.  Prox RCA-2 lesion is 20% stenosed.  Mid LAD lesion is 100% stenosed. Stent thrombosis of LAD stent placed earlier in the week.  Thombectomy restored TIMI 3 flow. Then IVUS showed the prior stent was undeployed. Balloon angioplasty was performed first with a 3.25 mm balloon. The thrombus decreased significantly.  Final Balloon angioplasty was performed using a BALL[place] TREK RX 3.75X12,  up to 3.91 mm. Sizing was optimized with IVUS.  Post intervention, there is a 0% residual stenosis.  There is no aortic valve stenosis.  LV end diastolic pressure is severely elevated.  Patient was reloaded with Brilinta.  There was some question as to whether he was taking the Brilinta once a day or twice a day at home.  Apparently, it was also an issue at the end of his last hospitalization where he felt all of the medicines were making him sick.  We will have to try to get history from family members to see whether or not he truly was taking his medicines.  Regardless, stent size was optimized with larger postdilatation balloons. Elevated LVEDP.  IV Lasix was given.  He will need medical therapy for LV dysfunction most likely.   DG Chest Portable 1 View  Result Date: 12/03/2020 CLINICAL DATA:  Left-sided chest pain. EXAM: PORTABLE CHEST 1 VIEW COMPARISON:  Nov 28, 2020 FINDINGS: The lungs are hyperinflated. Diffuse, chronic appearing increased interstitial lung markings are seen. Stable areas of bilateral upper lobe emphysematous lung disease and biapical architectural distortion are noted. There is no evidence of a pleural effusion or pneumothorax. The heart size and mediastinal contours are within normal limits. Both lungs are clear. The visualized skeletal structures are unremarkable. IMPRESSION: COPD without acute cardiopulmonary disease. Electronically Signed   By: Aram Candela M.D.   On: 12/03/2020 21:41     Assessment and Plan:   1. Acute anterior wall MI: Given the recent  history, this is concerning for stent thrombosis.  I personally reviewed the ECG and made the decision for emergent cardiac catheterization.  Ultimately, he will need dual antiplatelet therapy will have to make sure that he is compliant with the antiplatelet therapy.  He will need high-dose statin.  Beta-blocker as tolerated although he is bradycardic at this point.  Cath showed:   Mid LAD lesion is 100% stenosed. Stent thrombosis of LAD stent placed earlier in the week.  Thombectomy restored TIMI 3 flow. Then IVUS showed the prior stent was undeployed. Balloon angioplasty was performed first with a 3.25 mm balloon. The thrombus decreased significantly.  Final Balloon angioplasty was performed using a BALLOON [place] TREK RX 3.75X12, up to 3.91 mm. Sizing was optimized with IVUS.  Post intervention, there is a 0% residual stenosis.  There is no aortic valve stenosis.  LV end diastolic pressure is severely elevated.   Patient was reloaded with Brilinta.  There was some question as to whether he was taking the Brilinta once a day or twice a day at home.  Apparently, it was also an issue at the end of his last hospitalization where he felt all of the medicines were making him sick.  We will have to try to get history from family members to see whether or not he truly was taking his medicines.  Regardless, stent size was optimized with larger postdilatation balloons.  Given the issues with antiplatelet therapy, I elected not to place another stent.  Elevated LVEDP.  IV Lasix was given.  He will need medical therapy for LV dysfunction most likely. He was reloaded with Brilinta in the Cath Lab.  I left a message with his daughter who is the emergency contact regarding the findings and his location.  I also spoke with Dr. Arville Care about the plan.   Risk Assessment/Risk Scores:     TIMI Risk Score for ST  Elevation MI:   The patient's TIMI risk score is 5, which  indicates a 12.4% risk of all cause  mortality at 30 days.         For questions or updates, please contact [facility] Please consult [email or web address] for contact info under    Signed, Lance Muss, MD  12/03/2020 11:32 PM

## 2020-12-03 NOTE — ED Notes (Addendum)
Ed provider bedside. Pt going to be called stemi. Pt changed into gown, defib pads placed, awaiting cardiologist arrival

## 2020-12-03 NOTE — ED Notes (Signed)
Cardiologist bedside agnaci

## 2020-12-03 NOTE — ED Provider Notes (Signed)
[facility] Emergency Department Provider Note  ____________________________________________   I have reviewed the triage vital signs and the nursing notes.   HISTORY  Chief Complaint Chest Pain   History limited by: Not Limited   HPI Peter Page is a 60 y.o. male who presents to the emergency department today because of concern for chest pain. It started while he was working on his car. Located in his left chest it does radiate down his left arm. Did have associated shortness of breath and sweating. The patient was in the hospital roughly 1 week ago for STEMI and had stent placement. He states he has been taking his medication as prescribed. The patient has not had any pain until today.   Records reviewed. Per medical record review patient has a history of COPD, asthma.   Past Medical History:  Diagnosis Date  . Asthma   . COPD (chronic obstructive pulmonary disease) [facility])     Patient Active Problem List   Diagnosis Date Noted  . Protein-calorie malnutrition, severe 11/30/2020  . Acute ST elevation myocardial infarction (STEMI) involving left anterior descending (LAD) coronary artery (HCC) 11/28/2020  . Malnutrition of moderate degree 05/17/2020  . Nausea   . Nonintractable headache   . Essential hypertension   . Hypokalemia   . Cerebellar stroke, acute (HCC) 05/16/2020  . History of tobacco use 05/16/2020  . Acute CVA (cerebrovascular accident) (HCC) 05/16/2020  . Hypotension   . Dizziness   . Chronic obstructive pulmonary disease (HCC)   . Subclavian artery stenosis, left [facility])     Past Surgical History:  Procedure Laterality Date  . CORONARY/GRAFT ACUTE MI REVASCULARIZATION N/A 11/28/2020   Procedure: Coronary/Graft Acute MI Revascularization;  Surgeon: Marcina Millard, MD;  Location: [facility] INVASIVE CV LAB;  Service: Cardiovascular;  Laterality: N/A;  . LEFT HEART CATH AND CORONARY ANGIOGRAPHY N/A 11/28/2020   Procedure: LEFT HEART  CATH AND CORONARY ANGIOGRAPHY;  Surgeon: Marcina Millard, MD;  Location: [facility] INVASIVE CV LAB;  Service: Cardiovascular;  Laterality: N/A;    Prior to Admission medications   Medication Sig Start Date End Date Taking? Authorizing Provider  aspirin EC 81 MG EC tablet Take 1 tablet (81 mg total) by mouth daily. Swallow whole. 05/20/20   Tresa Moore, MD  atorvastatin (LIPITOR) 80 MG tablet Take 1 tablet (80 mg total) by mouth daily. 11/30/20   Arnetha Courser, MD  famotidine (PEPCID) 20 MG tablet Take 1 tablet (20 mg total) by mouth 2 (two) times daily. 09/04/17   Sharman Cheek, MD  feeding supplement (ENSURE ENLIVE / ENSURE PLUS) LIQD Take 237 mLs by mouth 3 (three) times daily between meals. 11/30/20   Arnetha Courser, MD  metoprolol succinate (TOPROL-XL) 25 MG 24 hr tablet Take 0.5 tablets (12.5 mg total) by mouth daily. 12/01/20   Arnetha Courser, MD  Multiple Vitamin (MULTIVITAMIN WITH MINERALS) TABS tablet Take 1 tablet by mouth daily. 11/30/20   Arnetha Courser, MD  nitroGLYCERIN (NITROSTAT) 0.4 MG SL tablet Place 1 tablet (0.4 mg total) under the tongue every 5 (five) minutes x 3 doses as needed for chest pain. 11/30/20   Arnetha Courser, MD  ticagrelor (BRILINTA) 90 MG TABS tablet Take 1 tablet (90 mg total) by mouth 2 (two) times daily. 11/30/20   Arnetha Courser, MD    Allergies Patient has no known allergies.  Family History  Problem Relation Age of Onset  . CAD Father     Social History Social History   Tobacco Use  .  Smoking status: Former Smoker    Types: Cigarettes    Quit date: 2020    Years since quitting: 2.4  . Smokeless tobacco: Never Used  Vaping Use  . Vaping Use: Former  Substance Use Topics  . Alcohol use: Yes    Comment: "i did all night I wish I hadn't"  . Drug use: Yes    Types: Marijuana    Comment: daily    Review of Systems Constitutional: No fever/chills Eyes: No visual changes. ENT: No sore throat. Cardiovascular: Positive for chest  pain. Respiratory: Denies shortness of breath. Gastrointestinal: No abdominal pain.  No nausea, no vomiting.  No diarrhea.   Genitourinary: Negative for dysuria. Musculoskeletal: Negative for back pain. Skin: Negative for rash. Neurological: Negative for headaches, focal weakness or numbness.  ____________________________________________   PHYSICAL EXAM:  VITAL SIGNS: ED Triage Vitals  Enc Vitals Group     BP 12/03/20 2044 (!) 117/96     Pulse Rate 12/03/20 2044 (!) 54     Resp 12/03/20 2044 16     Temp 12/03/20 2044 98 F (36.7 C)     Temp Source 12/03/20 2044 Oral     SpO2 12/03/20 2044 97 %     Weight 12/03/20 2043 117 lb 11.2 oz (53.4 kg)     Height 12/03/20 2043 5\' 9"  (1.753 m)     Head Circumference --      Peak Flow --      Pain Score 12/03/20 2043 7   Constitutional: Alert and oriented.  Eyes: Conjunctivae are normal.  ENT      Head: Normocephalic and atraumatic.      Nose: No congestion/rhinnorhea.      Mouth/Throat: Mucous membranes are moist.      Neck: No stridor. Hematological/Lymphatic/Immunilogical: No cervical lymphadenopathy. Cardiovascular: Bradycardia, regular rhythm.  No murmurs, rubs, or gallops.  Respiratory: Normal respiratory effort without tachypnea nor retractions. Breath sounds are clear and equal bilaterally. No wheezes/rales/rhonchi. Gastrointestinal: Soft and non tender. No rebound. No guarding.  Genitourinary: Deferred Musculoskeletal: Normal range of motion in all extremities. No lower extremity edema. Neurologic:  Normal speech and language. No gross focal neurologic deficits are appreciated.  Skin:  Skin is warm, dry and intact. No rash noted. Psychiatric: Mood and affect are normal. Speech and behavior are normal. Patient exhibits appropriate insight and judgment.  ____________________________________________    LABS (pertinent positives/negatives)  COVID negative Trop hs 776 BMP wnl except co2 19, glu 131 CBC wbc 9.6, hgb 14.9,  plt 326  ____________________________________________   EKG  I, 2044, attending physician, personally viewed and interpreted this EKG  EKG Time: 2043 Rate: 49 Rhythm: sinus bradycardia Axis: left axis deviation Intervals: qtc 405 QRS: narrow ST changes: ST elevation in  V2, V3, V4, V5 Impression: Abnormal ekg  ____________________________________________    RADIOLOGY  CXR COPD without acute exacerbation  ____________________________________________   PROCEDURES  Procedures  ____________________________________________   INITIAL IMPRESSION / ASSESSMENT AND PLAN / ED COURSE  Pertinent labs & imaging results that were available during my care of the patient were reviewed by me and considered in my medical decision making (see chart for details).   Patient presented to the emergency department today via EMS because of concerns for chest pain.  Patient did have a recent stent placement here in the hospital.  EKG did shows elevation in V2 through V5.  When compared to most recent previous EKG this is a significant change.  I did discuss with Dr. 2044 with  cardiology.  Given change in EKG and pain patient was taken emergently to the cardiac catheterization lab.   ____________________________________________   FINAL CLINICAL IMPRESSION(S) / ED DIAGNOSES  Final diagnoses:  Chest pain, unspecified type     Note: This dictation was prepared with Dragon dictation. Any transcriptional errors that result from this process are unintentional     Phineas Semen, MD 12/03/20 2341

## 2020-12-03 NOTE — ED Notes (Signed)
Pt to cath lab.

## 2020-12-03 NOTE — ED Notes (Signed)
Pt to cath lab with rn, tech and cardiologist

## 2020-12-03 NOTE — Progress Notes (Signed)
  Chaplain On-Call responded to Code STEMI notification to the ED room 3.  Chaplain met the patient at bedside and provided brief spiritual support and prayer.  Patient was taken urgently to the Cath Lab for further procedures.  Chaplain will be available for support as needed.  Chaplain Pollyann Samples M.Div., [facility]

## 2020-12-03 NOTE — H&P (Addendum)
[facility]   PATIENT NAME: Peter Page    MR#:  [id number]  DATE OF BIRTH:  1960/08/05  DATE OF ADMISSION:  12/03/2020  PRIMARY CARE PHYSICIAN: Patient, No Pcp Per (Inactive)   Patient is coming from: Home  REQUESTING/REFERRING PHYSICIAN: Corky Crafts, MD  CHIEF COMPLAINT:   Chief Complaint  Patient presents with  . Chest Pain  . Code STEMI    HISTORY OF PRESENT ILLNESS:  TAVARIOUS FREEL is a 60 y.o. Caucasian male with medical history significant for Coronary artery disease and recent anterior STEMI about a week ago for which he PCI and LAD stent.  He was discharged home and apparently has not been taking his medications due to the patient denied.  He experienced chest pain today while working on his car.  It was left-sided pressure and radiated to his left arm with associated nausea and vomiting once as well as diaphoresis.  He admitted to associated dyspnea as well as diaphoresis.  No cough or wheezing or hemoptysis.  No leg pain or edema recent travels.  No fever or chills.  No nausea or vomiting or abdominal pain.  No bleeding diathesis.  The patient went to the Cath Lab and had expansion of his occluded LAD stent by Dr. Eldridge Dace tonight.  His chest pain that was initially 10/10 came down to 1/10.  He was also given Lasix for suspected fluid overload.  He denies any fever or chills.   ED Course: When he came to the ER blood pressure was 117/96 and heart rate was 54 with otherwise normal vital signs.  Later blood pressure was 132/70 with a heart rate of 51 and respiratory rate of 21.  Pulse oximetry was 99% on 2 L of O2 by nasal cannula. EKG as reviewed by me : ST segment elevation anterolaterally with sinus bradycardia with a rate of 49 and probable left atrial enlargement and T wave inversion inferiorly Imaging: Chest x-ray showed COPD without acute cardiopulmonary disease.  The patient was given IV heparin initially and IV morphine sulfate as well as Zofran.   Post cath he will be placed on aspirin and Brilinta in addition to high-dose statin.  He was given IV Aggrastat for a couple of hours during the procedure.  He will be admitted to an ICU bed for further evaluation and management.  PAST MEDICAL HISTORY:   Past Medical History:  Diagnosis Date  . Asthma   . COPD (chronic obstructive pulmonary disease) (HCC)     PAST SURGICAL HISTORY:   Past Surgical History:  Procedure Laterality Date  . CORONARY/GRAFT ACUTE MI REVASCULARIZATION N/A 11/28/2020   Procedure: Coronary/Graft Acute MI Revascularization;  Surgeon: Marcina Millard, MD;  Location: [facility] INVASIVE CV LAB;  Service: Cardiovascular;  Laterality: N/A;  . LEFT HEART CATH AND CORONARY ANGIOGRAPHY N/A 11/28/2020   Procedure: LEFT HEART CATH AND CORONARY ANGIOGRAPHY;  Surgeon: Marcina Millard, MD;  Location: [facility] INVASIVE CV LAB;  Service: Cardiovascular;  Laterality: N/A;    SOCIAL HISTORY:   Social History   Tobacco Use  . Smoking status: Former Smoker    Types: Cigarettes    Quit date: 2020    Years since quitting: 2.4  . Smokeless tobacco: Never Used  Substance Use Topics  . Alcohol use: Yes    Comment: "i did all night I wish I hadn't"    FAMILY HISTORY:   Family History  Problem Relation Age of Onset  . CAD Father  DRUG ALLERGIES:  No Known Allergies  REVIEW OF SYSTEMS:   ROS As per history of present illness. All pertinent systems were reviewed above. Constitutional, HEENT, cardiovascular, respiratory, GI, GU, musculoskeletal, neuro, psychiatric, endocrine, integumentary and hematologic systems were reviewed and are otherwise negative/unremarkable except for positive findings mentioned above in the HPI.   MEDICATIONS AT HOME:   Prior to Admission medications   Medication Sig Start Date End Date Taking? Authorizing Provider  aspirin EC 81 MG EC tablet Take 1 tablet (81 mg total) by mouth daily. Swallow whole. 05/20/20   Tresa Moore, MD   atorvastatin (LIPITOR) 80 MG tablet Take 1 tablet (80 mg total) by mouth daily. 11/30/20   Arnetha Courser, MD  famotidine (PEPCID) 20 MG tablet Take 1 tablet (20 mg total) by mouth 2 (two) times daily. 09/04/17   Sharman Cheek, MD  feeding supplement (ENSURE ENLIVE / ENSURE PLUS) LIQD Take 237 mLs by mouth 3 (three) times daily between meals. 11/30/20   Arnetha Courser, MD  metoprolol succinate (TOPROL-XL) 25 MG 24 hr tablet Take 0.5 tablets (12.5 mg total) by mouth daily. 12/01/20   Arnetha Courser, MD  Multiple Vitamin (MULTIVITAMIN WITH MINERALS) TABS tablet Take 1 tablet by mouth daily. 11/30/20   Arnetha Courser, MD  nitroGLYCERIN (NITROSTAT) 0.4 MG SL tablet Place 1 tablet (0.4 mg total) under the tongue every 5 (five) minutes x 3 doses as needed for chest pain. 11/30/20   Arnetha Courser, MD  ticagrelor (BRILINTA) 90 MG TABS tablet Take 1 tablet (90 mg total) by mouth 2 (two) times daily. 11/30/20   Arnetha Courser, MD      VITAL SIGNS:  Blood pressure 132/70, pulse (!) 51, temperature 98 F (36.7 C), temperature source Oral, resp. rate (!) 21, height 5\' 9"  (1.753 m), weight 53.4 kg, SpO2 99 %.  PHYSICAL EXAMINATION:  Physical Exam  GENERAL:  60 y.o.-year-old Caucasian male patient lying in the bed with no acute distress.  EYES: Pupils equal, round, reactive to light and accommodation. No scleral icterus. Extraocular muscles intact.  HEENT: Head atraumatic, normocephalic. Oropharynx and nasopharynx clear.  NECK:  Supple, no jugular venous distention. No thyroid enlargement, no tenderness.  LUNGS: Normal breath sounds bilaterally, no wheezing, rales,rhonchi or crepitation. No use of accessory muscles of respiration.  CARDIOVASCULAR: Regular rate and rhythm, S1, S2 normal. No murmurs, rubs, or gallops.  ABDOMEN: Soft, nondistended, nontender. Bowel sounds present. No organomegaly or mass.  EXTREMITIES: No pedal edema, cyanosis, or clubbing.  NEUROLOGIC: Cranial nerves II through XII are intact. Muscle  strength 5/5 in all extremities. Sensation intact. Gait not checked.  PSYCHIATRIC: The patient is alert and oriented x 3.  Normal affect and good eye contact. SKIN: No obvious rash, lesion, or ulcer.   LABORATORY PANEL:   CBC Recent Labs  Lab 12/03/20 2049  WBC 9.6  HGB 14.9  HCT 43.8  PLT 326   ------------------------------------------------------------------------------------------------------------------  Chemistries  Recent Labs  Lab 12/03/20 2049  NA 140  K 3.8  CL 108  CO2 19*  GLUCOSE 131*  BUN 17  CREATININE 1.09  CALCIUM 9.1   ------------------------------------------------------------------------------------------------------------------  Cardiac Enzymes No results for input(s): TROPONINI in the last 168 hours. ------------------------------------------------------------------------------------------------------------------  RADIOLOGY:  DG Chest Portable 1 View  Result Date: 12/03/2020 CLINICAL DATA:  Left-sided chest pain. EXAM: PORTABLE CHEST 1 VIEW COMPARISON:  Nov 28, 2020 FINDINGS: The lungs are hyperinflated. Diffuse, chronic appearing increased interstitial lung markings are seen. Stable areas of bilateral upper lobe emphysematous lung disease and  biapical architectural distortion are noted. There is no evidence of a pleural effusion or pneumothorax. The heart size and mediastinal contours are within normal limits. Both lungs are clear. The visualized skeletal structures are unremarkable. IMPRESSION: COPD without acute cardiopulmonary disease. Electronically Signed   By: Aram Candela M.D.   On: 12/03/2020 21:41      IMPRESSION AND PLAN:  Active Problems:   Acute anterior wall MI (HCC)   STEMI (ST elevation myocardial infarction) (HCC)  1.  Acute anterior wall ST segment elevation myocardial infarction secondary to LAD stent occlusion after 1 week from stent placement, likely due to noncompliance, status post PCI and expansion. - The patient  will be admitted to an ICU bed. - Will be continued on aspirin and Brilinta as well as high-dose statin therapy and beta-blocker therapy. - He will be placed on as needed sublingual nitroglycerin.  2.  Essential hypertension. - We will continue Toprol-XL and he will be placed on as needed IV hydralazine and labetalol.  3.  Dyslipidemia. - We will continue high-dose statin therapy.  4.  GERD. - We will continue his H2 blocker therapy.  5.  History of cerebellar CVA. - The patient will be placed on aspirin as mentioned above as well as statin therapy.   DVT prophylaxis: Lovenox. Code Status: full code.  Family Communication:  The plan of care was discussed in details with the patient (and family). I answered all questions. The patient agreed to proceed with the above mentioned plan. Further management will depend upon hospital course. Disposition Plan: Back to previous home environment Consults called: Cardiology. All the records are reviewed and case discussed with ED provider.  Status is: Inpatient  Remains inpatient appropriate because:Ongoing active pain requiring inpatient pain management, Altered mental status, Ongoing diagnostic testing needed not appropriate for outpatient work up, Unsafe d/c plan, IV treatments appropriate due to intensity of illness or inability to take PO and Inpatient level of care appropriate due to severity of illness   Dispo: The patient is from: Home              Anticipated d/c is to: Home              Patient currently is not medically stable to d/c.   Difficult to place patient No   TOTAL TIME TAKING CARE OF THIS PATIENT: 55 minutes.    Hannah Beat M.D on 12/03/2020 at 11:03 PM  [facility]   From 7 PM-7 AM, contact night-coverage [email or web address]  CC: Primary care physician; Patient, No Pcp Per (Inactive)

## 2020-12-03 NOTE — ED Triage Notes (Addendum)
Pt from home via ems, working on car and developed L chest into arm/neck pain 10/10, received 1 spray ntg/173mcg fentanyl pain 7/10. was here 5/30-6/1 for stemi and stent x1

## 2020-12-04 DIAGNOSIS — R079 Chest pain, unspecified: Secondary | ICD-10-CM

## 2020-12-04 DIAGNOSIS — I2102 ST elevation (STEMI) myocardial infarction involving left anterior descending coronary artery: Secondary | ICD-10-CM

## 2020-12-04 LAB — TROPONIN I (HIGH SENSITIVITY): Troponin I (High Sensitivity): >24000 ng/L

## 2020-12-04 LAB — CBC
HCT: 44.1 % (ref 39.0–52.0)
Hemoglobin: 15.5 g/dL (ref 13.0–17.0)
MCH: 31.3 pg (ref 26.0–34.0)
MCHC: 35.1 g/dL (ref 30.0–36.0)
MCV: 88.9 fL (ref 80.0–100.0)
Platelets: 326 10*3/uL (ref 150–400)
RBC: 4.96 MIL/uL (ref 4.22–5.81)
RDW: 13.7 % (ref 11.5–15.5)
WBC: 11.2 10*3/uL — ABNORMAL HIGH (ref 4.0–10.5)
nRBC: 0 % (ref 0.0–0.2)

## 2020-12-04 LAB — BASIC METABOLIC PANEL
Anion gap: 13 (ref 5–15)
BUN: 17 mg/dL (ref 8–23)
CO2: 21 mmol/L — ABNORMAL LOW (ref 22–32)
Calcium: 8.6 mg/dL — ABNORMAL LOW (ref 8.9–10.3)
Chloride: 103 mmol/L (ref 98–111)
Creatinine, Ser: 1.02 mg/dL (ref 0.61–1.24)
GFR, Estimated: >60 mL/min (ref >60)
Glucose, Bld: 123 mg/dL — ABNORMAL HIGH (ref 70–99)
Potassium: 3.7 mmol/L (ref 3.5–5.1)
Sodium: 137 mmol/L (ref 135–145)

## 2020-12-04 LAB — PROTIME-INR
INR: 0.9 (ref 0.8–1.2)
Prothrombin Time: 12.6 seconds (ref 11.4–15.2)

## 2020-12-04 LAB — HEPARIN LEVEL (UNFRACTIONATED)
Heparin Unfractionated: <0.1 IU/mL — ABNORMAL LOW (ref 0.30–0.70)
Heparin Unfractionated: 0.13 IU/mL — ABNORMAL LOW (ref 0.30–0.70)

## 2020-12-04 LAB — APTT: aPTT: 33 seconds (ref 24–36)

## 2020-12-04 MED ORDER — HYDROMORPHONE HCL 1 MG/ML IJ SOLN
0.5000 mg | INTRAMUSCULAR | Status: DC | PRN
  Administered 2020-12-04: 0.5 mg via INTRAVENOUS
  Filled 2020-12-04: qty 1

## 2020-12-04 MED ORDER — SODIUM CHLORIDE 0.9 % IV SOLN
12.5000 mg | Freq: Four times a day (QID) | INTRAVENOUS | Status: DC | PRN
  Filled 2020-12-04: qty 0.5

## 2020-12-04 MED ORDER — HEPARIN BOLUS VIA INFUSION
1500.0000 [IU] | Freq: Once | INTRAVENOUS | Status: AC
  Administered 2020-12-04: 1500 [IU] via INTRAVENOUS
  Filled 2020-12-04: qty 1500

## 2020-12-04 MED ORDER — CHLORHEXIDINE GLUCONATE CLOTH 2 % EX PADS
6.0000 | MEDICATED_PAD | Freq: Every day | CUTANEOUS | Status: DC
  Administered 2020-12-05 – 2020-12-06 (×2): 6 via TOPICAL

## 2020-12-04 MED ORDER — ATORVASTATIN CALCIUM 80 MG PO TABS
80.0000 mg | ORAL_TABLET | Freq: Every day | ORAL | Status: DC
  Administered 2020-12-04 – 2020-12-06 (×3): 80 mg via ORAL
  Filled 2020-12-04 (×3): qty 1
  Filled 2020-12-04: qty 4

## 2020-12-04 MED ORDER — SODIUM CHLORIDE 0.9 % IV BOLUS
250.0000 mL | Freq: Once | INTRAVENOUS | Status: AC
  Administered 2020-12-04: 250 mL via INTRAVENOUS

## 2020-12-04 MED ORDER — ONDANSETRON HCL 4 MG/2ML IJ SOLN
4.0000 mg | INTRAMUSCULAR | Status: DC | PRN
  Administered 2020-12-04 – 2020-12-05 (×4): 4 mg via INTRAVENOUS
  Filled 2020-12-04 (×4): qty 2

## 2020-12-04 MED ORDER — HEPARIN (PORCINE) 25000 UT/250ML-% IV SOLN
1300.0000 [IU]/h | INTRAVENOUS | Status: DC
  Administered 2020-12-04: 700 [IU]/h via INTRAVENOUS
  Administered 2020-12-05: 1200 [IU]/h via INTRAVENOUS
  Filled 2020-12-04 (×2): qty 250

## 2020-12-04 MED ORDER — MORPHINE SULFATE (PF) 2 MG/ML IV SOLN
1.0000 mg | INTRAVENOUS | Status: DC | PRN
  Administered 2020-12-04: 1 mg via INTRAVENOUS
  Filled 2020-12-04: qty 1

## 2020-12-04 MED ORDER — SODIUM CHLORIDE 0.9 % IV BOLUS
500.0000 mL | Freq: Once | INTRAVENOUS | Status: AC
  Administered 2020-12-04: 500 mL via INTRAVENOUS

## 2020-12-04 NOTE — Progress Notes (Signed)
PROGRESS NOTE    Peter Page  NWG:[id number] DOB: 1961/01/19 DOA: 12/03/2020 PCP: Patient, No Pcp Per (Inactive)   Brief Narrative: Taken from H&P. Peter Page is a 60 y.o. Caucasian male with medical history significant for Coronary artery disease and recent anterior STEMI about a week ago for which he PCI and LAD stent, patient was discharged on 11/30/2020.  There was some concern of being noncompliant but according to patient he was taking all of his medications regularly.  Started getting left-sided chest pain with associated nausea, vomiting and diaphoresis.  Found to have ST elevation in anterolateral leads, taken to Cath Lab emergently for STEMI of anterior wall, found to have extension of his LAD lesion requiring another stent.  Subjective: Patient was feeling little improved when seen this morning.  No chest pain or shortness of breath.  Softer blood pressure but he also received Dilaudid earlier.  Denies any dizziness.  We discussed about being compliant with his medications, per patient he was taking all of his medications very regularly and did not missed any dose.  Assessment & Plan:   Active Problems:   Acute anterior wall MI (HCC)   STEMI (ST elevation myocardial infarction) (HCC)  STEMI involving anterior wall.  Patient had a stent placed in LAD on 11/28/2020 and with then few days found to have occlusion of that stent along with some extension requiring another intervention. Blood pressure currently softer but he also received Dilaudid earlier.  Otherwise asymptomatic. Cardiology is following-appreciate their help. -Continue with DAPT -Currently on heparin infusion too. -Aggressive risk modification which include high intensity statin. -Cardiac rehab  Essential hypertension.  Currently softer blood pressure. -Holding morning dose of metoprolol.  Dyslipidemia. -Continue high intensity statin  GERD. -Continue home dose of H2 blocker  History of cerebellar  CVA. -Continue aspirin and statin  Objective: Vitals:   12/04/20 0630 12/04/20 0700 12/04/20 0800 12/04/20 1000  BP: 100/73 112/77 120/66 (!) 89/77  Pulse: 69 61 65 (!) 57  Resp: 19 (!) 30 13   Temp:   97.6 F (36.4 C)   TempSrc:   Oral   SpO2: 92% 94% 98% 98%  Weight:      Height:        Intake/Output Summary (Last 24 hours) at 12/04/2020 1133 Last data filed at 12/04/2020 1050 Gross per 24 hour  Intake 94.62 ml  Output 1650 ml  Net -1555.38 ml   Filed Weights   12/03/20 2043  Weight: 53.4 kg    Examination:  General exam: Frail, malnourished gentleman, appears calm and comfortable  Respiratory system: Clear to auscultation. Respiratory effort normal. Cardiovascular system: S1 & S2 heard, RRR.  Gastrointestinal system: Soft, nontender, nondistended, bowel sounds positive. Central nervous system: Alert and oriented. No focal neurological deficits.Symmetric 5 x 5 power. Extremities: No edema, no cyanosis, pulses intact and symmetrical. Psychiatry: Judgement and insight appear normal. Mood & affect appropriate.    DVT prophylaxis: Heparin Code Status: Full Family Communication: Discussed with patient Disposition Plan:  Status is: Inpatient  Remains inpatient appropriate because:Inpatient level of care appropriate due to severity of illness   Dispo: The patient is from: Home              Anticipated d/c is to: Home              Patient currently is not medically stable to d/c.   Difficult to place patient No  Level of care: ICU  All the records are reviewed and case discussed with Care Management/Social Worker. Management plans discussed with the patient, nursing and they are in agreement.  Consultants:   Cardiology  Procedures:  Antimicrobials:   Data Reviewed: I have personally reviewed following labs and imaging studies  CBC: Recent Labs  Lab 11/28/20 0521 11/29/20 0436 11/30/20 0430 12/03/20 2049 12/04/20 0432  WBC 13.3* 10.5 8.8  9.6 11.2*  HGB 15.7 13.7 14.6 14.9 15.5  HCT 46.7 40.0 42.7 43.8 44.1  MCV 92.8 90.3 88.2 91.4 88.9  PLT 353 257 235 326 326   Basic Metabolic Panel: Recent Labs  Lab 11/28/20 0521 11/29/20 0436 11/30/20 0430 12/03/20 2049 12/04/20 0432  NA 141 139 135 140 137  K 4.1 3.8 3.7 3.8 3.7  CL 108 112* 106 108 103  CO2 20* 19* 22 19* 21*  GLUCOSE 181* 83 96 131* 123*  BUN CREATININE 1.16 1.04 0.89 1.09 1.02  CALCIUM 9.0 8.1* 8.2* 9.1 8.6*   GFR: Estimated Creatinine Clearance: 57.4 mL/min (by C-G formula based on SCr of 1.02 mg/dL). Liver Function Tests: No results for input(s): AST, ALT, ALKPHOS, BILITOT, PROT, ALBUMIN in the last 168 hours. No results for input(s): LIPASE, AMYLASE in the last 168 hours. No results for input(s): AMMONIA in the last 168 hours. Coagulation Profile: Recent Labs  Lab 12/04/20 0432  INR 0.9   Cardiac Enzymes: No results for input(s): CKTOTAL, CKMB, CKMBINDEX, TROPONINI in the last 168 hours. BNP (last 3 results) No results for input(s): PROBNP in the last 8760 hours. HbA1C: No results for input(s): HGBA1C in the last 72 hours. CBG: Recent Labs  Lab 11/28/20 0654 11/28/20 1634 12/03/20 2305  GLUCAP 110* 94 146*   Lipid Profile: No results for input(s): CHOL, HDL, LDLCALC, TRIG, CHOLHDL, LDLDIRECT in the last 72 hours. Thyroid Function Tests: No results for input(s): TSH, T4TOTAL, FREET4, T3FREE, THYROIDAB in the last 72 hours. Anemia Panel: No results for input(s): VITAMINB12, FOLATE, FERRITIN, TIBC, IRON, RETICCTPCT in the last 72 hours. Sepsis Labs: No results for input(s): PROCALCITON, LATICACIDVEN in the last 168 hours.  Recent Results (from the past 240 hour(s))  Resp Panel by RT-PCR (Flu A&B, Covid) Nasopharyngeal Swab     Status: None   Collection Time: 11/28/20  5:08 AM   Specimen: Nasopharyngeal Swab; Nasopharyngeal(NP) swabs in vial transport medium  Result Value Ref Range Status   SARS Coronavirus 2 by RT  PCR NEGATIVE NEGATIVE Final    Comment: (NOTE) SARS-CoV-2 target nucleic acids are NOT DETECTED.  The SARS-CoV-2 RNA is generally detectable in upper respiratory specimens during the acute phase of infection. The lowest concentration of SARS-CoV-2 viral copies this assay can detect is 138 copies/mL. A negative result does not preclude SARS-Cov-2 infection and should not be used as the sole basis for treatment or other patient management decisions. A negative result may occur with  improper specimen collection/handling, submission of specimen other than nasopharyngeal swab, presence of viral mutation(s) within the areas targeted by this assay, and inadequate number of viral copies(<138 copies/mL). A negative result must be combined with clinical observations, patient history, and epidemiological information. The expected result is Negative.  Fact Sheet for Patients:  BloggerCourse.com  Fact Sheet for Healthcare Providers:  SeriousBroker.it  This test is no t yet approved or cleared by the [place] FDA and  has been authorized for detection and/or diagnosis of SARS-CoV-2 by FDA under an Emergency Use Authorization (EUA).  This EUA will remain  in effect (meaning this test can be used) for the duration of the COVID-19 declaration under Section 564(b)(1) of the Act, 21 U.S.C.section 360bbb-3(b)(1), unless the authorization is terminated  or revoked sooner.       Influenza A by PCR NEGATIVE NEGATIVE Final   Influenza B by PCR NEGATIVE NEGATIVE Final    Comment: (NOTE) The Xpert Xpress SARS-CoV-2/FLU/RSV plus assay is intended as an aid in the diagnosis of influenza from Nasopharyngeal swab specimens and should not be used as a sole basis for treatment. Nasal washings and aspirates are unacceptable for Xpert Xpress SARS-CoV-2/FLU/RSV testing.  Fact Sheet for Patients: BloggerCourse.com  Fact Sheet for  Healthcare Providers: SeriousBroker.it  This test is not yet approved or cleared by the [place] FDA and has been authorized for detection and/or diagnosis of SARS-CoV-2 by FDA under an Emergency Use Authorization (EUA). This EUA will remain in effect (meaning this test can be used) for the duration of the COVID-19 declaration under Section 564(b)(1) of the Act, 21 U.S.C. section 360bbb-3(b)(1), unless the authorization is terminated or revoked.  Performed at [facility], [address] [place], [place] [address]   MRSA PCR Screening     Status: None   Collection Time: 11/28/20  6:55 AM   Specimen: Nasal Mucosa; Nasopharyngeal  Result Value Ref Range Status   MRSA by PCR NEGATIVE NEGATIVE Final    Comment:        The GeneXpert MRSA Assay (FDA approved for NASAL specimens only), is one component of a comprehensive MRSA colonization surveillance program. It is not intended to diagnose MRSA infection nor to guide or monitor treatment for MRSA infections. Performed at [facility], [address] [place], [place] [address]   Resp Panel by RT-PCR (Flu A&B, Covid) Nasopharyngeal Swab     Status: None   Collection Time: 12/03/20  9:01 PM   Specimen: Nasopharyngeal Swab; Nasopharyngeal(NP) swabs in vial transport medium  Result Value Ref Range Status   SARS Coronavirus 2 by RT PCR NEGATIVE NEGATIVE Final    Comment: (NOTE) SARS-CoV-2 target nucleic acids are NOT DETECTED.  The SARS-CoV-2 RNA is generally detectable in upper respiratory specimens during the acute phase of infection. The lowest concentration of SARS-CoV-2 viral copies this assay can detect is 138 copies/mL. A negative result does not preclude SARS-Cov-2 infection and should not be used as the sole basis for treatment or other patient management decisions. A negative result may occur with  improper specimen collection/handling, submission of specimen other than  nasopharyngeal swab, presence of viral mutation(s) within the areas targeted by this assay, and inadequate number of viral copies(<138 copies/mL). A negative result must be combined with clinical observations, patient history, and epidemiological information. The expected result is Negative.  Fact Sheet for Patients:  BloggerCourse.com  Fact Sheet for Healthcare Providers:  SeriousBroker.it  This test is no t yet approved or cleared by the [place] FDA and  has been authorized for detection and/or diagnosis of SARS-CoV-2 by FDA under an Emergency Use Authorization (EUA). This EUA will remain  in effect (meaning this test can be used) for the duration of the COVID-19 declaration under Section 564(b)(1) of the Act, 21 U.S.C.section 360bbb-3(b)(1), unless the authorization is terminated  or revoked sooner.       Influenza A by PCR NEGATIVE NEGATIVE Final   Influenza B by PCR NEGATIVE NEGATIVE Final    Comment: (NOTE) The Xpert Xpress SARS-CoV-2/FLU/RSV plus assay is intended as an  aid in the diagnosis of influenza from Nasopharyngeal swab specimens and should not be used as a sole basis for treatment. Nasal washings and aspirates are unacceptable for Xpert Xpress SARS-CoV-2/FLU/RSV testing.  Fact Sheet for Patients: BloggerCourse.com  Fact Sheet for Healthcare Providers: SeriousBroker.it  This test is not yet approved or cleared by the [place] FDA and has been authorized for detection and/or diagnosis of SARS-CoV-2 by FDA under an Emergency Use Authorization (EUA). This EUA will remain in effect (meaning this test can be used) for the duration of the COVID-19 declaration under Section 564(b)(1) of the Act, 21 U.S.C. section 360bbb-3(b)(1), unless the authorization is terminated or revoked.  Performed at [facility], [address], [place], [place]  [address]      Radiology Studies: CARDIAC CATHETERIZATION  Addendum Date: 12/03/2020    3rd Mrg lesion is 75% stenosed.  Prox RCA-1 lesion is 30% stenosed.  Prox RCA-2 lesion is 20% stenosed.  Mid LAD lesion is 100% stenosed. Stent thrombosis of LAD stent placed earlier in the week.  Thombectomy restored TIMI 3 flow. Then IVUS showed the prior stent was undeployed. Balloon angioplasty was performed first with a 3.25 mm balloon. The thrombus decreased significantly.  Final Balloon angioplasty was performed using a BALLOON [place] TREK RX 3.75X12, up to 3.91 mm. Sizing was optimized with IVUS.  Post intervention, there is a 0% residual stenosis.  There is no aortic valve stenosis.  LV end diastolic pressure is severely elevated.  Patient was reloaded with Brilinta.  There was some question as to whether he was taking the Brilinta once a day or twice a day at home.  Apparently, it was also an issue at the end of his last hospitalization where he felt all of the medicines were making him sick.  We will have to try to get history from family members to see whether or not he truly was taking his medicines.  Regardless, stent size was optimized with larger postdilatation balloons.  I tried to avoid additional stent placement given the potential issues that he had with taking antiplatelet therapy. Elevated LVEDP.  IV Lasix was given.  He will need medical therapy for LV dysfunction most likely. I did leave a message for his daughter who is listed as emergency contact with his location.   Result Date: 12/03/2020  3rd Mrg lesion is 75% stenosed.  Prox RCA-1 lesion is 30% stenosed.  Prox RCA-2 lesion is 20% stenosed.  Mid LAD lesion is 100% stenosed. Stent thrombosis of LAD stent placed earlier in the week.  Thombectomy restored TIMI 3 flow. Then IVUS showed the prior stent was undeployed. Balloon angioplasty was performed first with a 3.25 mm balloon. The thrombus decreased significantly.  Final Balloon  angioplasty was performed using a BALLOON [place] TREK RX 3.75X12, up to 3.91 mm. Sizing was optimized with IVUS.  Post intervention, there is a 0% residual stenosis.  There is no aortic valve stenosis.  LV end diastolic pressure is severely elevated.  Patient was reloaded with Brilinta.  There was some question as to whether he was taking the Brilinta once a day or twice a day at home.  Apparently, it was also an issue at the end of his last hospitalization where he felt all of the medicines were making him sick.  We will have to try to get history from family members to see whether or not he truly was taking his medicines.  Regardless, stent size was optimized with larger postdilatation balloons. Elevated LVEDP.  IV Lasix was given.  He will need medical therapy for LV dysfunction most likely.   DG Chest Portable 1 View  Result Date: 12/03/2020 CLINICAL DATA:  Left-sided chest pain. EXAM: PORTABLE CHEST 1 VIEW COMPARISON:  Nov 28, 2020 FINDINGS: The lungs are hyperinflated. Diffuse, chronic appearing increased interstitial lung markings are seen. Stable areas of bilateral upper lobe emphysematous lung disease and biapical architectural distortion are noted. There is no evidence of a pleural effusion or pneumothorax. The heart size and mediastinal contours are within normal limits. Both lungs are clear. The visualized skeletal structures are unremarkable. IMPRESSION: COPD without acute cardiopulmonary disease. Electronically Signed   By: Aram Candelahaddeus  Houston M.D.   On: 12/03/2020 21:41    Scheduled Meds: . aspirin  81 mg Oral Daily  . atorvastatin  80 mg Oral Daily  . Chlorhexidine Gluconate Cloth  6 each Topical Daily  . famotidine  20 mg Oral BID  . feeding supplement  237 mL Oral TID BM  . multivitamin with minerals  1 tablet Oral Daily  . sodium chloride flush  3 mL Intravenous Q12H  . ticagrelor  90 mg Oral BID   Continuous Infusions: . sodium chloride 20 mL/hr at 12/03/20 2102  . sodium chloride     . heparin 700 Units/hr (12/04/20 1000)     LOS: 1 day   Time spent: 40 minutes. More than 50% of the time was spent in counseling/coordination of care  Arnetha CourserSumayya Romanita Fager, MD [facility]  If 7PM-7AM, please contact night-coverage [email or web address]  12/04/2020, 11:33 AM   This record has been created using Conservation officer, historic buildingsDragon voice recognition software. Errors have been sought and corrected,but may not always be located. Such creation errors do not reflect on the standard of care.

## 2020-12-04 NOTE — Plan of Care (Signed)
Neuro: Stable at baseline, patient sleeping for most of the day Resp: stable on room air to 4L [place]-pt removes himself, clubbing of fingers and toes CV: afebrile, hypotension throughout the day- MD aware-213mL NS bolus given, no change-con't to monitor GIGU: Not interested in eating today, utilizing urinal, no BM, some nausea resolved with zofran Skin: clean dry and intact   Social: Daughter called to check in today, all questions and concerns from her and patient addressed.   Problem: Education: Goal: Knowledge of General Education information will improve Description: Including pain rating scale, medication(s)/side effects and non-pharmacologic comfort measures Outcome: Progressing   Problem: Health Behavior/Discharge Planning: Goal: Ability to manage health-related needs will improve Outcome: Progressing   Problem: Clinical Measurements: Goal: Ability to maintain clinical measurements within normal limits will improve Outcome: Progressing Goal: Will remain free from infection Outcome: Progressing Goal: Diagnostic test results will improve Outcome: Progressing Goal: Respiratory complications will improve Outcome: Progressing Goal: Cardiovascular complication will be avoided Outcome: Progressing   Problem: Activity: Goal: Risk for activity intolerance will decrease Outcome: Progressing   Problem: Nutrition: Goal: Adequate nutrition will be maintained Outcome: Progressing   Problem: Coping: Goal: Level of anxiety will decrease Outcome: Progressing   Problem: Elimination: Goal: Will not experience complications related to bowel motility Outcome: Progressing Goal: Will not experience complications related to urinary retention Outcome: Progressing   Problem: Pain Managment: Goal: General experience of comfort will improve Outcome: Progressing   Problem: Safety: Goal: Ability to remain free from injury will improve Outcome: Progressing   Problem: Skin Integrity: Goal:  Risk for impaired skin integrity will decrease Outcome: Progressing

## 2020-12-04 NOTE — Progress Notes (Signed)
ANTICOAGULATION CONSULT NOTE  Pharmacy Consult for heparin infusion Indication: chest pain/ACS/STEMI  No Known Allergies  Patient Measurements: Height: 5\' 9"  (175.3 cm) Weight: 53.4 kg (117 lb 11.2 oz) IBW/kg (Calculated) : 70.7 Heparin Dosing Weight: 53.4 kg  Vital Signs: Temp: 98.4 F (36.9 C) (06/05 1600) Temp Source: Oral (06/05 1600) BP: 87/68 (06/05 1800) Pulse Rate: 69 (06/05 1800)  Labs: Recent Labs    12/03/20 2049 12/03/20 2304 12/04/20 0432 12/04/20 1058 12/04/20 1905  HGB 14.9  --  15.5  --   --   HCT 43.8  --  44.1  --   --   PLT 326  --  326  --   --   APTT  --   --  33  --   --   LABPROT  --   --  12.6  --   --   INR  --   --  0.9  --   --   HEPARINUNFRC  --   --   --  <0.10* 0.13*  CREATININE 1.09  --  1.02  --   --   TROPONINIHS 776* >24,000*  --   --   --     Estimated Creatinine Clearance: 57.4 mL/min (by C-G formula based on SCr of 1.02 mg/dL).   Medical History: Past Medical History:  Diagnosis Date  . Asthma   . COPD (chronic obstructive pulmonary disease) (HCC)     Medications:   Ticagrelor 90 mg BID/asa 81 Aggrastat (6/4)  Assessment: Pt is 60 yo male, post cardiac cath on 6/4 and hx of CAD, admitted with  "acute anterior wall ST segment elevation MI secondary to LAD stent occlusion after 1 week from stent placement, likely due to noncompliance, status post PCI and expansion."  Goal of Therapy:  Heparin level 0.3-0.7 units/ml Monitor platelets by anticoagulation protocol: Yes  No original bolus per MD - heparin started at 700 units/hr  6/5 1058 HL < 0.10 - subtherapeutic, increased to 900 units/hr 6/5 1905 HL 0.13, subtherapeutic    Plan:  Heparin subtherapeutic.. Will repeat bolus of 1500 units x 1 and increase infusion to 1100 units/hr. Check HL 6 hrs after rate change CBC daily while on heparin.  8/4, PharmD, BCPS Clinical Pharmacist 12/04/2020 8:10 PM

## 2020-12-04 NOTE — Progress Notes (Signed)
ANTICOAGULATION CONSULT NOTE  Pharmacy Consult for heparin infusion Indication: chest pain/ACS/STEMI  No Known Allergies  Patient Measurements: Height: 5\' 9"  (175.3 cm) Weight: 53.4 kg (117 lb 11.2 oz) IBW/kg (Calculated) : 70.7 Heparin Dosing Weight: 53.4 kg  Vital Signs: Temp: 97.6 F (36.4 C) (06/05 0800) Temp Source: Oral (06/05 0800) BP: 89/77 (06/05 1000) Pulse Rate: 57 (06/05 1000)  Labs: Recent Labs    12/03/20 2049 12/03/20 2304 12/04/20 0432 12/04/20 1058  HGB 14.9  --  15.5  --   HCT 43.8  --  44.1  --   PLT 326  --  326  --   APTT  --   --  33  --   LABPROT  --   --  12.6  --   INR  --   --  0.9  --   HEPARINUNFRC  --   --   --  <0.10*  CREATININE 1.09  --  1.02  --   TROPONINIHS 776* >24,000*  --   --     Estimated Creatinine Clearance: 57.4 mL/min (by C-G formula based on SCr of 1.02 mg/dL).   Medical History: Past Medical History:  Diagnosis Date  . Asthma   . COPD (chronic obstructive pulmonary disease) (HCC)     Medications:   Ticagrelor 90 mg BID/asa 81 Aggrastat (6/4)  Assessment: Pt is 60 yo male, post cardiac cath on 6/4 and hx of CAD, admitted with  "acute anterior wall ST segment elevation MI secondary to LAD stent occlusion after 1 week from stent placement, likely due to noncompliance, status post PCI and expansion."  Goal of Therapy:  Heparin level 0.3-0.7 units/ml Monitor platelets by anticoagulation protocol: Yes  No original bolus per MD - heparin started at 700 units/hr  6/5 1058 HL < 0.10 - subtherapeutic   Plan:  Will now bolus 1500 units and increase heparin infusion to 900 unit/hr  Check HL 6 hrs after rate change CBC daily while on heparin.  8/4, PharmD, BCPS Clinical Pharmacist 12/04/2020 12:28 PM

## 2020-12-04 NOTE — Progress Notes (Signed)
Patient Name: Peter Page Date of Encounter: 12/04/2020  Hospital Problem List     Active Problems:   Acute anterior wall MI [facility])   STEMI (ST elevation myocardial infarction) [facility])    Patient Profile     60 year old male with history of coronary disease status post recent anterior myocardial infarction with placement of a drug-eluting stent in the LAD on 11/28/2020.  Cardiac cath at that time showed two-vessel coronary disease with an ulcerated 99% stenosis in the proximal to mid LAD and a 75% stenosis in the OM 3 with an EF mildly reduced.  He had moderate to severe MR at the time.  He had a resolute Onyx 2.75 x 18 mm stent placed in the proximal to mid LAD.  He was discharged on Brilinta.  He returned to the ER yesterday with recurrent chest pain.  EKG suggested anterior STEMI.  Cardiac cath done urgently revealed 100% in-stent thrombosis with again a 70% stenosis in the third marginal and severe disease in the RCA.  Patient underwent balloon angioplasty with a 3.75 x 12 mm balloon.  Was taken up to 3.91 mm.  He had elevated LVEDP.  He was placed back on Brilinta.  He is unclear whether he was compliant with his Brilinta which she had been discharged on several days earlier.  Subjective   Intermittent chest pain.  Inpatient Medications    . aspirin  81 mg Oral Daily  . atorvastatin  80 mg Oral Daily  . famotidine  20 mg Oral BID  . feeding supplement  237 mL Oral TID BM  . multivitamin with minerals  1 tablet Oral Daily  . sodium chloride flush  3 mL Intravenous Q12H  . ticagrelor  90 mg Oral BID    Vital Signs    Vitals:   12/04/20 0630 12/04/20 0700 12/04/20 0800 12/04/20 1000  BP: 100/73 112/77 120/66 (!) 89/77  Pulse: 69 61 65 (!) 57  Resp: 19 (!) 30 13   Temp:   97.6 F (36.4 C)   TempSrc:   Oral   SpO2: 92% 94% 98% 98%  Weight:      Height:        Intake/Output Summary (Last 24 hours) at 12/04/2020 1033 Last data filed at 12/04/2020 1000 Gross per 24 hour   Intake 91.62 ml  Output 1650 ml  Net -1558.38 ml   Filed Weights   12/03/20 2043  Weight: 53.4 kg    Physical Exam     HEENT: normal.  Neck: Supple, no JVD, carotid bruits, or masses. Cardiac: RRR, no murmurs, rubs, or gallops. No clubbing, cyanosis, edema.  Radials/DP/PT 2+ and equal bilaterally.  Respiratory:  Respirations regular and unlabored, clear to auscultation bilaterally. GI: Soft, nontender, nondistended, BS + x 4. MS: no deformity or atrophy. Skin: warm and dry, no rash. Neuro:  Strength and sensation are intact. Psych: Normal affect.  Labs    CBC Recent Labs    12/03/20 2049 12/04/20 0432  WBC 9.6 11.2*  HGB 14.9 15.5  HCT 43.8 44.1  MCV 91.4 88.9  PLT 326 326   Basic Metabolic Panel Recent Labs    67/34/19 2049 12/04/20 0432  NA 140 137  K 3.8 3.7  CL 108 103  CO2 19* 21*  GLUCOSE 131* 123*  BUN 17 17  CREATININE 1.09 1.02  CALCIUM 9.1 8.6*   Liver Function Tests No results for input(s): AST, ALT, ALKPHOS, BILITOT, PROT, ALBUMIN in the last 72 hours. No results  for input(s): LIPASE, AMYLASE in the last 72 hours. Cardiac Enzymes No results for input(s): CKTOTAL, CKMB, CKMBINDEX, TROPONINI in the last 72 hours. BNP No results for input(s): BNP in the last 72 hours. D-Dimer No results for input(s): DDIMER in the last 72 hours. Hemoglobin A1C No results for input(s): HGBA1C in the last 72 hours. Fasting Lipid Panel No results for input(s): CHOL, HDL, LDLCALC, TRIG, CHOLHDL, LDLDIRECT in the last 72 hours. Thyroid Function Tests No results for input(s): TSH, T4TOTAL, T3FREE, THYROIDAB in the last 72 hours.  Invalid input(s): FREET3  Telemetry    Sinus rhythm  ECG    Evolving anterolateral myocardial infarction  Radiology    CARDIAC CATHETERIZATION  Addendum Date: 12/03/2020    3rd Mrg lesion is 75% stenosed.  Prox RCA-1 lesion is 30% stenosed.  Prox RCA-2 lesion is 20% stenosed.  Mid LAD lesion is 100% stenosed. Stent  thrombosis of LAD stent placed earlier in the week.  Thombectomy restored TIMI 3 flow. Then IVUS showed the prior stent was undeployed. Balloon angioplasty was performed first with a 3.25 mm balloon. The thrombus decreased significantly.  Final Balloon angioplasty was performed using a BALLOON [place] TREK RX 3.75X12, up to 3.91 mm. Sizing was optimized with IVUS.  Post intervention, there is a 0% residual stenosis.  There is no aortic valve stenosis.  LV end diastolic pressure is severely elevated.  Patient was reloaded with Brilinta.  There was some question as to whether he was taking the Brilinta once a day or twice a day at home.  Apparently, it was also an issue at the end of his last hospitalization where he felt all of the medicines were making him sick.  We will have to try to get history from family members to see whether or not he truly was taking his medicines.  Regardless, stent size was optimized with larger postdilatation balloons.  I tried to avoid additional stent placement given the potential issues that he had with taking antiplatelet therapy. Elevated LVEDP.  IV Lasix was given.  He will need medical therapy for LV dysfunction most likely. I did leave a message for his daughter who is listed as emergency contact with his location.   Result Date: 12/03/2020  3rd Mrg lesion is 75% stenosed.  Prox RCA-1 lesion is 30% stenosed.  Prox RCA-2 lesion is 20% stenosed.  Mid LAD lesion is 100% stenosed. Stent thrombosis of LAD stent placed earlier in the week.  Thombectomy restored TIMI 3 flow. Then IVUS showed the prior stent was undeployed. Balloon angioplasty was performed first with a 3.25 mm balloon. The thrombus decreased significantly.  Final Balloon angioplasty was performed using a BALLOON [place] TREK RX 3.75X12, up to 3.91 mm. Sizing was optimized with IVUS.  Post intervention, there is a 0% residual stenosis.  There is no aortic valve stenosis.  LV end diastolic pressure is severely  elevated.  Patient was reloaded with Brilinta.  There was some question as to whether he was taking the Brilinta once a day or twice a day at home.  Apparently, it was also an issue at the end of his last hospitalization where he felt all of the medicines were making him sick.  We will have to try to get history from family members to see whether or not he truly was taking his medicines.  Regardless, stent size was optimized with larger postdilatation balloons. Elevated LVEDP.  IV Lasix was given.  He will need medical therapy for LV dysfunction most likely.  CARDIAC CATHETERIZATION  Result Date: 11/28/2020  Prox RCA-1 lesion is 30% stenosed.  Prox RCA-2 lesion is 20% stenosed.  3rd Mrg lesion is 75% stenosed.  Prox LAD to Mid LAD lesion is 99% stenosed.  A drug-eluting stent was successfully placed using a STENT RESOLUTE ONYX [id number].  Post intervention, there is a 0% residual stenosis.  There is moderate (3+) mitral regurgitation.  1.  Anterior STEMI 2.  Two-vessel coronary artery disease with ulcerated 99% stenosis proximal/mid LAD and 75% stenosis OM 3 3.  Mildly reduced left ventricular function with anteroapical akinesis 4.  Moderate to severe mitral regurgitation 5.  Successful primary PCI with DES proximal/mid LAD Recommendations 1.  Dual antiplatelet therapy uninterrupted for 1 year 2.  High intensity atorvastatin 80 mg daily 3.  Metoprolol tartrate 25 mg twice daily 4.  2D echocardiogram   DG Chest Portable 1 View  Result Date: 12/03/2020 CLINICAL DATA:  Left-sided chest pain. EXAM: PORTABLE CHEST 1 VIEW COMPARISON:  Nov 28, 2020 FINDINGS: The lungs are hyperinflated. Diffuse, chronic appearing increased interstitial lung markings are seen. Stable areas of bilateral upper lobe emphysematous lung disease and biapical architectural distortion are noted. There is no evidence of a pleural effusion or pneumothorax. The heart size and mediastinal contours are within normal limits. Both lungs are  clear. The visualized skeletal structures are unremarkable. IMPRESSION: COPD without acute cardiopulmonary disease. Electronically Signed   By: Aram Candela M.D.   On: 12/03/2020 21:41   DG Chest Portable 1 View  Result Date: 11/28/2020 CLINICAL DATA:  ST elevation myocardial infarction EXAM: PORTABLE CHEST 1 VIEW COMPARISON:  05/15/2020 FINDINGS: Hyperinflation and interstitial coarsening with biapical architectural distortion. There is hazy density bilaterally, nodule not excluded. Artifact from EKG leads and defibrillator pads. Normal heart size and stable aortic contours. IMPRESSION: 1. Recommend follow-up chest CT to evaluate hazy density at the apices. 2. Emphysema. 3. No acute finding related to the history of myocardial infarction. Electronically Signed   By: Marnee Spring M.D.   On: 11/28/2020 05:19   ECHOCARDIOGRAM COMPLETE  Result Date: 11/28/2020    ECHOCARDIOGRAM REPORT   Patient Name:   OZRO RUSSETT Date of Exam: 11/28/2020 Medical Rec #:  [id number]       Height:       69.0 in Accession #:    [id number]      Weight:       122.4 lb Date of Birth:  1961/01/21       BSA:          1.677 m Patient Age:    60 years        BP:           129/76 mmHg Patient Gender: M               HR:           61 bpm. Exam Location:  [facility] Procedure: 2D Echo and Strain Analysis Indications:     AMI I21.9  History:         Patient has prior history of Echocardiogram examinations, most                  recent 05/17/2020.  Sonographer:     Overton Mam RDCS Referring Phys:  [id number] Lyn Hollingshead PARASCHOS Diagnosing Phys: Marcina Millard MD  Sonographer Comments: Global longitudinal strain was attempted. IMPRESSIONS  1. Left ventricular ejection fraction, by estimation, is 40 to 45%. The left ventricle has mildly decreased function. The left ventricle demonstrates  regional wall motion abnormalities (see scoring diagram/findings for description). Left ventricular diastolic parameters were normal.  2. Right  ventricular systolic function is normal. The right ventricular size is normal.  3. The mitral valve is normal in structure. Mild mitral valve regurgitation. No evidence of mitral stenosis.  4. Tricuspid valve regurgitation is mild to moderate.  5. The aortic valve is normal in structure. Aortic valve regurgitation is not visualized. No aortic stenosis is present.  6. The inferior vena cava is normal in size with greater than 50% respiratory variability, suggesting right atrial pressure of 3 mmHg. FINDINGS  Left Ventricle: Left ventricular ejection fraction, by estimation, is 40 to 45%. The left ventricle has mildly decreased function. The left ventricle demonstrates regional wall motion abnormalities. The left ventricular internal cavity size was normal in size. There is no left ventricular hypertrophy. Left ventricular diastolic parameters were normal.  LV Wall Scoring: The entire apex is akinetic. Right Ventricle: The right ventricular size is normal. No increase in right ventricular wall thickness. Right ventricular systolic function is normal. Left Atrium: Left atrial size was normal in size. Right Atrium: Right atrial size was normal in size. Pericardium: There is no evidence of pericardial effusion. Mitral Valve: The mitral valve is normal in structure. Mild mitral valve regurgitation. No evidence of mitral valve stenosis. Tricuspid Valve: The tricuspid valve is normal in structure. Tricuspid valve regurgitation is mild to moderate. No evidence of tricuspid stenosis. Aortic Valve: The aortic valve is normal in structure. Aortic valve regurgitation is not visualized. No aortic stenosis is present. Aortic valve peak gradient measures 3.6 mmHg. Pulmonic Valve: The pulmonic valve was normal in structure. Pulmonic valve regurgitation is not visualized. No evidence of pulmonic stenosis. Aorta: The aortic root is normal in size and structure. Venous: The inferior vena cava is normal in size with greater than 50%  respiratory variability, suggesting right atrial pressure of 3 mmHg. IAS/Shunts: No atrial level shunt detected by color flow Doppler.  LEFT VENTRICLE PLAX 2D LVIDd:         4.34 cm     Diastology LVIDs:         3.37 cm     LV e' medial:    7.51 cm/s LV PW:         1.07 cm     LV E/e' medial:  11.0 LV IVS:        0.91 cm     LV e' lateral:   9.36 cm/s LVOT diam:     2.20 cm     LV E/e' lateral: 8.8 LV SV:         55 LV SV Index:   33 LVOT Area:     3.80 cm  LV Volumes (MOD) LV vol d, MOD A2C: 75.2 ml LV vol d, MOD A4C: 92.0 ml LV vol s, MOD A2C: 36.9 ml LV vol s, MOD A4C: 45.1 ml LV SV MOD A2C:     38.3 ml LV SV MOD A4C:     92.0 ml LV SV MOD BP:      48.7 ml RIGHT VENTRICLE RV Basal diam:  2.99 cm RV S prime:     8.49 cm/s TAPSE (M-mode): 1.5 cm LEFT ATRIUM             Index       RIGHT ATRIUM          Index LA diam:        3.40 cm 2.03 cm/m  RA Area:  7.82 cm LA Vol (A2C):   30.0 ml 17.89 ml/m RA Volume:   14.10 ml 8.41 ml/m LA Vol (A4C):   25.6 ml 15.27 ml/m LA Biplane Vol: 30.1 ml 17.95 ml/m  AORTIC VALVE                PULMONIC VALVE AV Area (Vmax): 2.90 cm    PV Vmax:       0.76 m/s AV Vmax:        95.00 cm/s  PV Peak grad:  2.3 mmHg AV Peak Grad:   3.6 mmHg LVOT Vmax:      72.50 cm/s LVOT Vmean:     47.400 cm/s LVOT VTI:       0.145 m  AORTA Ao Root diam: 3.20 cm MITRAL VALVE               TRICUSPID VALVE MV Area (PHT): 4.19 cm    TV Peak grad:   23.3 mmHg MV Decel Time: 181 msec    TV Vmax:        2.42 m/s MV E velocity: 82.30 cm/s MV A velocity: 51.00 cm/s  SHUNTS MV E/A ratio:  1.61        Systemic VTI:  0.14 m                            Systemic Diam: 2.20 cm Marcina Millard MD Electronically signed by Marcina Millard MD Signature Date/Time: 11/28/2020/1:19:07 PM    Final     Assessment & Plan    60 year old male with history of coronary disease status post LAD STEMI earlier this week with placement of a drug-eluting stent.  Now had recurrence with acute in-stent restenosis.   Unclear whether the patient was compliant with his Brilinta.  Stent was upsized by balloon and he was placed back on his Brilinta and aspirin and heparin.  Currently hemodynamically somewhat soft pressure appears stable with stable MAP.  1.  Coronary disease-status post recurrent anterior myocardial infarction due to in-stent restenosis.  May have been secondary to noncompliance with Brilinta.  Has been placed back on this.  Will need stress regarding compliance with this.  We will hold beta-blocker due to soft blood pressure and follow.  Will need to repeat echocardiogram to determine if there is any further LV change to guide further therapy.  Continue with careful diuresis following renal function hemodynamics and electrolytes.  Signed, Darlin Priestly Carlo Lorson MD 12/04/2020, 10:33 AM  Pager: [phone]

## 2020-12-04 NOTE — Progress Notes (Signed)
ANTICOAGULATION CONSULT NOTE  Pharmacy Consult for heparin infusion Indication: chest pain/ACS/STEMI  No Known Allergies  Patient Measurements: Height: 5\' 9"  (175.3 cm) Weight: 53.4 kg (117 lb 11.2 oz) IBW/kg (Calculated) : 70.7 Heparin Dosing Weight: 53.4 kg  Vital Signs: Temp: 97.4 F (36.3 C) (06/04 2305) Temp Source: Oral (06/04 2305) BP: 95/69 (06/05 0415) Pulse Rate: 58 (06/05 0415)  Labs: Recent Labs    12/03/20 2049 12/03/20 2304  HGB 14.9  --   HCT 43.8  --   PLT 326  --   CREATININE 1.09  --   TROPONINIHS 776* >24,000*    Estimated Creatinine Clearance: 53.8 mL/min (by C-G formula based on SCr of 1.09 mg/dL).   Medical History: Past Medical History:  Diagnosis Date  . Asthma   . COPD (chronic obstructive pulmonary disease) (HCC)     Medications:   Ticagrelor 90 mg BID Aggrastat (6/4)  Assessment: Pt is 60 yo male, post cardiac cath on 6/4 and hx of CAD, admitted with  "acute anterior wall ST segment elevation MI secondary to LAD stent occlusion after 1 week from stent placement, likely due to noncompliance, status post PCI and expansion."  Goal of Therapy:  Heparin level 0.3-0.7 units/ml Monitor platelets by anticoagulation protocol: Yes   Plan:  Ordered baseline labs No bolus per MD. Starting heparin infusion at 700 unit/hr at 0500 per MD. Check HL 6 hrs after start of infusion CBC daily while on heparin.  8/4, PharmD, [facility] 12/04/2020 4:34 AM

## 2020-12-05 ENCOUNTER — Inpatient Hospital Stay
Admit: 2020-12-05 | Discharge: 2020-12-05 | Disposition: A | Discharge status: Home / Self Care | Payer: Medicare Other | Attending: Cardiology | Admitting: Cardiology

## 2020-12-05 ENCOUNTER — Encounter: Payer: Self-pay | Admitting: Interventional Cardiology

## 2020-12-05 LAB — ECHOCARDIOGRAM COMPLETE
AR max vel: 2.67 cm2
AV Area VTI: 2.43 cm2
AV Area mean vel: 2.36 cm2
AV Mean grad: 2 mmHg
AV Peak grad: 3.2 mmHg
Ao pk vel: 0.9 m/s
Area-P 1/2: 4.74 cm2
Calc EF: 27.1 %
Height: 69 in
S' Lateral: 3.51 cm
Single Plane A2C EF: 34.5 %
Single Plane A4C EF: 21 %
Weight: 1883.2 oz

## 2020-12-05 LAB — BASIC METABOLIC PANEL
Anion gap: 10 (ref 5–15)
BUN: 16 mg/dL (ref 8–23)
CO2: 21 mmol/L — ABNORMAL LOW (ref 22–32)
Calcium: 8.1 mg/dL — ABNORMAL LOW (ref 8.9–10.3)
Chloride: 105 mmol/L (ref 98–111)
Creatinine, Ser: 0.93 mg/dL (ref 0.61–1.24)
GFR, Estimated: >60 mL/min (ref >60)
Glucose, Bld: 96 mg/dL (ref 70–99)
Potassium: 3.7 mmol/L (ref 3.5–5.1)
Sodium: 136 mmol/L (ref 135–145)

## 2020-12-05 LAB — CBC
HCT: 43.7 % (ref 39.0–52.0)
Hemoglobin: 15.3 g/dL (ref 13.0–17.0)
MCH: 31.2 pg (ref 26.0–34.0)
MCHC: 35 g/dL (ref 30.0–36.0)
MCV: 89 fL (ref 80.0–100.0)
Platelets: 277 10*3/uL (ref 150–400)
RBC: 4.91 MIL/uL (ref 4.22–5.81)
RDW: 13.6 % (ref 11.5–15.5)
WBC: 8.6 10*3/uL (ref 4.0–10.5)
nRBC: 0 % (ref 0.0–0.2)

## 2020-12-05 LAB — HEPARIN LEVEL (UNFRACTIONATED)
Heparin Unfractionated: 0.24 IU/mL — ABNORMAL LOW (ref 0.30–0.70)
Heparin Unfractionated: 0.28 IU/mL — ABNORMAL LOW (ref 0.30–0.70)
Heparin Unfractionated: <0.1 IU/mL — ABNORMAL LOW (ref 0.30–0.70)

## 2020-12-05 MED ORDER — SODIUM CHLORIDE 0.9 % IV BOLUS
500.0000 mL | Freq: Once | INTRAVENOUS | Status: AC
  Administered 2020-12-05: 500 mL via INTRAVENOUS

## 2020-12-05 MED ORDER — HEPARIN BOLUS VIA INFUSION
800.0000 [IU] | Freq: Once | INTRAVENOUS | Status: AC
  Administered 2020-12-05: 800 [IU] via INTRAVENOUS
  Filled 2020-12-05: qty 800

## 2020-12-05 NOTE — Progress Notes (Signed)
*  PRELIMINARY RESULTS* Echocardiogram 2D Echocardiogram has been performed.  Peter Page 12/05/2020, 8:01 AM 

## 2020-12-05 NOTE — Progress Notes (Signed)
Patient alert and oriented x 4. Patient states that he has no pain. Patient stated that he felt nauseous, he received Zofran x2 (see MAR). Patient blood pressure was soft with systolic's in the 70's. Reached out to the hospitaist about the patients blood pressure. Patient received 2 boluses of saline (see MAR) per doctors orders. Moved patient blood pressure cuff to his right ankle and blood pressures are better with systolic in the 90's to low 100's. Patient states that he is very cold and would like to take a bath. Patient bathed himself, fitted sheet was changed, and warm blankets were provided. Patients son Gerilyn Pilgrim called and stated he would call back later this morning.

## 2020-12-05 NOTE — Progress Notes (Signed)
PROGRESS NOTE    Peter Page  DJS:[id number] DOB: 1960/12/08 DOA: 12/03/2020 PCP: Patient, No Pcp Per (Inactive)   Brief Narrative: Taken from H&P. Peter Page is a 60 y.o. Caucasian male with medical history significant for Coronary artery disease and recent anterior STEMI about a week ago for which he PCI and LAD stent, patient was discharged on 11/30/2020.  There was some concern of being noncompliant but according to patient he was taking all of his medications regularly.  Started getting left-sided chest pain with associated nausea, vomiting and diaphoresis.  Found to have ST elevation in anterolateral leads, taken to Cath Lab emergently for STEMI of anterior wall, found to have extension of his LAD lesion requiring another stent.  Subjective: Patient was seen and examined today.  Denies any chest pain or shortness of breath.  Assessment & Plan:   Active Problems:   Acute anterior wall MI (HCC)   STEMI (ST elevation myocardial infarction) (HCC)   Chest pain  STEMI involving anterior wall.  Patient had a stent placed in LAD on 11/28/2020 and with then few days found to have occlusion of that stent along with some extension requiring another intervention. Blood pressure currently softer but he also received Dilaudid earlier.  Otherwise asymptomatic. Cardiology is following-appreciate their help. -Continue with DAPT -Currently on heparin infusion too. -Aggressive risk modification which include high intensity statin. -Cardiac rehab -Echocardiogram done-pending results  Essential hypertension.  Blood pressure within goal. -Continue with metoprolol and monitor  Dyslipidemia. -Continue high intensity statin  GERD. -Continue home dose of H2 blocker  History of cerebellar CVA. -Continue aspirin and statin  Severe protein caloric malnutrition. Estimated body mass index is 17.38 kg/m as calculated from the following:   Height as of this encounter: 5\' 9"  (1.753 m).   Weight  as of this encounter: 53.4 kg. -Dietitian consult  Objective: Vitals:   12/05/20 0900 12/05/20 1000 12/05/20 1100 12/05/20 1200  BP: 119/81 110/78 113/78   Pulse: 80 78 (!) 109 69  Resp: 20 (!) 23 (!) 25 (!) 23  Temp:    98.1 F (36.7 C)  TempSrc:    Oral  SpO2: 96% 94% 92% 97%  Weight:      Height:        Intake/Output Summary (Last 24 hours) at 12/05/2020 1517 Last data filed at 12/05/2020 1353 Gross per 24 hour  Intake 1511.19 ml  Output 710 ml  Net 801.19 ml   Filed Weights   12/03/20 2043  Weight: 53.4 kg    Examination:  General.  Very frail and malnourished gentleman, in no acute distress. Pulmonary.  Lungs clear bilaterally, normal respiratory effort. CV.  Regular rate and rhythm, no JVD, rub or murmur. Abdomen.  Soft, nontender, nondistended, BS positive. CNS.  Alert and oriented x3.  No focal neurologic deficit. Extremities.  No edema, no cyanosis, pulses intact and symmetrical. Psychiatry.  Judgment and insight appears normal.   DVT prophylaxis: Heparin Code Status: Full Family Communication: Discussed with patient Disposition Plan:  Status is: Inpatient  Remains inpatient appropriate because:Inpatient level of care appropriate due to severity of illness   Dispo: The patient is from: Home              Anticipated d/c is to: Home              Patient currently is not medically stable to d/c.   Difficult to place patient No  Level of care: Progressive Cardiac  All the records are reviewed and case discussed with Care Management/Social Worker. Management plans discussed with the patient, nursing and they are in agreement.  Consultants:   Cardiology  Procedures:  Antimicrobials:   Data Reviewed: I have personally reviewed following labs and imaging studies  CBC: Recent Labs  Lab 11/29/20 0436 11/30/20 0430 12/03/20 2049 12/04/20 0432 12/05/20 0239  WBC 10.5 8.8 9.6 11.2* 8.6  HGB 13.7 14.6 14.9 15.5 15.3  HCT 40.0 42.7 43.8  44.1 43.7  MCV 90.3 88.2 91.4 88.9 89.0  PLT 257 235 326 326 277   Basic Metabolic Panel: Recent Labs  Lab 11/29/20 0436 11/30/20 0430 12/03/20 2049 12/04/20 0432 12/05/20 0239  NA 139 135 140 137 136  K 3.8 3.7 3.8 3.7 3.7  CL 112* 106 108 103 105  CO2 19* 22 19* 21* 21*  GLUCOSE 83 96 131* 123* 96  BUN CREATININE 1.04 0.89 1.09 1.02 0.93  CALCIUM 8.1* 8.2* 9.1 8.6* 8.1*   GFR: Estimated Creatinine Clearance: 63 mL/min (by C-G formula based on SCr of 0.93 mg/dL). Liver Function Tests: No results for input(s): AST, ALT, ALKPHOS, BILITOT, PROT, ALBUMIN in the last 168 hours. No results for input(s): LIPASE, AMYLASE in the last 168 hours. No results for input(s): AMMONIA in the last 168 hours. Coagulation Profile: Recent Labs  Lab 12/04/20 0432  INR 0.9   Cardiac Enzymes: No results for input(s): CKTOTAL, CKMB, CKMBINDEX, TROPONINI in the last 168 hours. BNP (last 3 results) No results for input(s): PROBNP in the last 8760 hours. HbA1C: No results for input(s): HGBA1C in the last 72 hours. CBG: Recent Labs  Lab 11/28/20 1634 12/03/20 2305  GLUCAP 94 146*   Lipid Profile: No results for input(s): CHOL, HDL, LDLCALC, TRIG, CHOLHDL, LDLDIRECT in the last 72 hours. Thyroid Function Tests: No results for input(s): TSH, T4TOTAL, FREET4, T3FREE, THYROIDAB in the last 72 hours. Anemia Panel: No results for input(s): VITAMINB12, FOLATE, FERRITIN, TIBC, IRON, RETICCTPCT in the last 72 hours. Sepsis Labs: No results for input(s): PROCALCITON, LATICACIDVEN in the last 168 hours.  Recent Results (from the past 240 hour(s))  Resp Panel by RT-PCR (Flu A&B, Covid) Nasopharyngeal Swab     Status: None   Collection Time: 11/28/20  5:08 AM   Specimen: Nasopharyngeal Swab; Nasopharyngeal(NP) swabs in vial transport medium  Result Value Ref Range Status   SARS Coronavirus 2 by RT PCR NEGATIVE NEGATIVE Final    Comment: (NOTE) SARS-CoV-2 target nucleic acids are  NOT DETECTED.  The SARS-CoV-2 RNA is generally detectable in upper respiratory specimens during the acute phase of infection. The lowest concentration of SARS-CoV-2 viral copies this assay can detect is 138 copies/mL. A negative result does not preclude SARS-Cov-2 infection and should not be used as the sole basis for treatment or other patient management decisions. A negative result may occur with  improper specimen collection/handling, submission of specimen other than nasopharyngeal swab, presence of viral mutation(s) within the areas targeted by this assay, and inadequate number of viral copies(<138 copies/mL). A negative result must be combined with clinical observations, patient history, and epidemiological information. The expected result is Negative.  Fact Sheet for Patients:  BloggerCourse.com  Fact Sheet for Healthcare Providers:  SeriousBroker.it  This test is no t yet approved or cleared by the [place] FDA and  has been authorized for detection and/or diagnosis of SARS-CoV-2 by FDA under an Emergency Use Authorization (EUA). This EUA  will remain  in effect (meaning this test can be used) for the duration of the COVID-19 declaration under Section 564(b)(1) of the Act, 21 U.S.C.section 360bbb-3(b)(1), unless the authorization is terminated  or revoked sooner.       Influenza A by PCR NEGATIVE NEGATIVE Final   Influenza B by PCR NEGATIVE NEGATIVE Final    Comment: (NOTE) The Xpert Xpress SARS-CoV-2/FLU/RSV plus assay is intended as an aid in the diagnosis of influenza from Nasopharyngeal swab specimens and should not be used as a sole basis for treatment. Nasal washings and aspirates are unacceptable for Xpert Xpress SARS-CoV-2/FLU/RSV testing.  Fact Sheet for Patients: BloggerCourse.com  Fact Sheet for Healthcare Providers: SeriousBroker.it  This test is not  yet approved or cleared by the [place] FDA and has been authorized for detection and/or diagnosis of SARS-CoV-2 by FDA under an Emergency Use Authorization (EUA). This EUA will remain in effect (meaning this test can be used) for the duration of the COVID-19 declaration under Section 564(b)(1) of the Act, 21 U.S.C. section 360bbb-3(b)(1), unless the authorization is terminated or revoked.  Performed at [facility], [address] [place], [place] [address]   MRSA PCR Screening     Status: None   Collection Time: 11/28/20  6:55 AM   Specimen: Nasal Mucosa; Nasopharyngeal  Result Value Ref Range Status   MRSA by PCR NEGATIVE NEGATIVE Final    Comment:        The GeneXpert MRSA Assay (FDA approved for NASAL specimens only), is one component of a comprehensive MRSA colonization surveillance program. It is not intended to diagnose MRSA infection nor to guide or monitor treatment for MRSA infections. Performed at [facility], [address] [place], [place] [address]   Resp Panel by RT-PCR (Flu A&B, Covid) Nasopharyngeal Swab     Status: None   Collection Time: 12/03/20  9:01 PM   Specimen: Nasopharyngeal Swab; Nasopharyngeal(NP) swabs in vial transport medium  Result Value Ref Range Status   SARS Coronavirus 2 by RT PCR NEGATIVE NEGATIVE Final    Comment: (NOTE) SARS-CoV-2 target nucleic acids are NOT DETECTED.  The SARS-CoV-2 RNA is generally detectable in upper respiratory specimens during the acute phase of infection. The lowest concentration of SARS-CoV-2 viral copies this assay can detect is 138 copies/mL. A negative result does not preclude SARS-Cov-2 infection and should not be used as the sole basis for treatment or other patient management decisions. A negative result may occur with  improper specimen collection/handling, submission of specimen other than nasopharyngeal swab, presence of viral mutation(s) within the areas targeted by this  assay, and inadequate number of viral copies(<138 copies/mL). A negative result must be combined with clinical observations, patient history, and epidemiological information. The expected result is Negative.  Fact Sheet for Patients:  BloggerCourse.com  Fact Sheet for Healthcare Providers:  SeriousBroker.it  This test is no t yet approved or cleared by the [place] FDA and  has been authorized for detection and/or diagnosis of SARS-CoV-2 by FDA under an Emergency Use Authorization (EUA). This EUA will remain  in effect (meaning this test can be used) for the duration of the COVID-19 declaration under Section 564(b)(1) of the Act, 21 U.S.C.section 360bbb-3(b)(1), unless the authorization is terminated  or revoked sooner.       Influenza A by PCR NEGATIVE NEGATIVE Final   Influenza B by PCR NEGATIVE NEGATIVE Final    Comment: (NOTE) The Xpert Xpress SARS-CoV-2/FLU/RSV plus assay is intended as an aid in  the diagnosis of influenza from Nasopharyngeal swab specimens and should not be used as a sole basis for treatment. Nasal washings and aspirates are unacceptable for Xpert Xpress SARS-CoV-2/FLU/RSV testing.  Fact Sheet for Patients: BloggerCourse.comhttps://www.fda.gov/media/152166/download  Fact Sheet for Healthcare Providers: SeriousBroker.ithttps://www.fda.gov/media/152162/download  This test is not yet approved or cleared by the [place] FDA and has been authorized for detection and/or diagnosis of SARS-CoV-2 by FDA under an Emergency Use Authorization (EUA). This EUA will remain in effect (meaning this test can be used) for the duration of the COVID-19 declaration under Section 564(b)(1) of the Act, 21 U.S.C. section 360bbb-3(b)(1), unless the authorization is terminated or revoked.  Performed at [facility], [address] [place], [place] [id number]      Radiology Studies: CARDIAC CATHETERIZATION  Addendum Date: 12/03/2020     3rd Mrg lesion is 75% stenosed.  Prox RCA-1 lesion is 30% stenosed.  Prox RCA-2 lesion is 20% stenosed.  Mid LAD lesion is 100% stenosed. Stent thrombosis of LAD stent placed earlier in the week.  Thombectomy restored TIMI 3 flow. Then IVUS showed the prior stent was undeployed. Balloon angioplasty was performed first with a 3.25 mm balloon. The thrombus decreased significantly.  Final Balloon angioplasty was performed using a BALLOON [place] TREK RX 3.75X12, up to 3.91 mm. Sizing was optimized with IVUS.  Post intervention, there is a 0% residual stenosis.  There is no aortic valve stenosis.  LV end diastolic pressure is severely elevated.  Patient was reloaded with Brilinta.  There was some question as to whether he was taking the Brilinta once a day or twice a day at home.  Apparently, it was also an issue at the end of his last hospitalization where he felt all of the medicines were making him sick.  We will have to try to get history from family members to see whether or not he truly was taking his medicines.  Regardless, stent size was optimized with larger postdilatation balloons.  I tried to avoid additional stent placement given the potential issues that he had with taking antiplatelet therapy. Elevated LVEDP.  IV Lasix was given.  He will need medical therapy for LV dysfunction most likely. I did leave a message for his daughter who is listed as emergency contact with his location.   Result Date: 12/03/2020  3rd Mrg lesion is 75% stenosed.  Prox RCA-1 lesion is 30% stenosed.  Prox RCA-2 lesion is 20% stenosed.  Mid LAD lesion is 100% stenosed. Stent thrombosis of LAD stent placed earlier in the week.  Thombectomy restored TIMI 3 flow. Then IVUS showed the prior stent was undeployed. Balloon angioplasty was performed first with a 3.25 mm balloon. The thrombus decreased significantly.  Final Balloon angioplasty was performed using a BALLOON  TREK RX 3.75X12, up to 3.91 mm. Sizing was  optimized with IVUS.  Post intervention, there is a 0% residual stenosis.  There is no aortic valve stenosis.  LV end diastolic pressure is severely elevated.  Patient was reloaded with Brilinta.  There was some question as to whether he was taking the Brilinta once a day or twice a day at home.  Apparently, it was also an issue at the end of his last hospitalization where he felt all of the medicines were making him sick.  We will have to try to get history from family members to see whether or not he truly was taking his medicines.  Regardless, stent size was optimized with larger postdilatation balloons. Elevated LVEDP.  IV  Lasix was given.  He will need medical therapy for LV dysfunction most likely.   DG Chest Portable 1 View  Result Date: 12/03/2020 CLINICAL DATA:  Left-sided chest pain. EXAM: PORTABLE CHEST 1 VIEW COMPARISON:  Nov 28, 2020 FINDINGS: The lungs are hyperinflated. Diffuse, chronic appearing increased interstitial lung markings are seen. Stable areas of bilateral upper lobe emphysematous lung disease and biapical architectural distortion are noted. There is no evidence of a pleural effusion or pneumothorax. The heart size and mediastinal contours are within normal limits. Both lungs are clear. The visualized skeletal structures are unremarkable. IMPRESSION: COPD without acute cardiopulmonary disease. Electronically Signed   By: Aram Candela M.D.   On: 12/03/2020 21:41    Scheduled Meds: . aspirin  81 mg Oral Daily  . atorvastatin  80 mg Oral Daily  . Chlorhexidine Gluconate Cloth  6 each Topical Daily  . famotidine  20 mg Oral BID  . feeding supplement  237 mL Oral TID BM  . multivitamin with minerals  1 tablet Oral Daily  . sodium chloride flush  3 mL Intravenous Q12H  . ticagrelor  90 mg Oral BID   Continuous Infusions: . sodium chloride 20 mL/hr at 12/03/20 2102  . sodium chloride    . heparin 1,300 Units/hr (12/05/20 1211)  . promethazine (PHENERGAN) injection (IM  or IVPB)       LOS: 2 days   Time spent: 35 minutes. More than 50% of the time was spent in counseling/coordination of care  Arnetha Courser, MD [facility]  If 7PM-7AM, please contact night-coverage [email or web address]  12/05/2020, 3:17 PM   This record has been created using Conservation officer, historic buildings. Errors have been sought and corrected,but may not always be located. Such creation errors do not reflect on the standard of care.

## 2020-12-05 NOTE — Progress Notes (Signed)
ANTICOAGULATION CONSULT NOTE  Pharmacy Consult for heparin infusion Indication: chest pain/ACS/STEMI  Patient Measurements: Heparin Dosing Weight: 53.4 kg  Labs: Recent Labs    12/03/20 2049 12/03/20 2304 12/04/20 0432 12/04/20 1058 12/04/20 1905 12/05/20 0239 12/05/20 1004  HGB 14.9  --  15.5  --   --  15.3  --   HCT 43.8  --  44.1  --   --  43.7  --   PLT 326  --  326  --   --  277  --   APTT  --   --  33  --   --   --   --   LABPROT  --   --  12.6  --   --   --   --   INR  --   --  0.9  --   --   --   --   HEPARINUNFRC  --   --   --    < > 0.13* 0.24* 0.28*  CREATININE 1.09  --  1.02  --   --  0.93  --   TROPONINIHS 776* >24,000*  --   --   --   --   --    < > = values in this interval not displayed.    Estimated Creatinine Clearance: 63 mL/min (by C-G formula based on SCr of 0.93 mg/dL).   Medical History: Past Medical History:  Diagnosis Date  . Asthma   . COPD (chronic obstructive pulmonary disease) (HCC)     Medications:   Ticagrelor 90 mg BID/asa 81 Aggrastat (6/4)  Assessment: Pt is 60 yo male, post cardiac cath on 6/4 and hx of CAD, admitted with  "acute anterior wall ST segment elevation MI secondary to LAD stent occlusion after 1 week from stent placement, likely due to noncompliance, status post PCI and expansion."  Goal of Therapy:  Heparin level 0.3-0.7 units/ml Monitor platelets by anticoagulation protocol: Yes  No original bolus per MD - heparin started at 700 units/hr  6/5 1058 HL < 0.10 - subtherapeutic, increased to 900 units/hr 6/5 1905 HL 0.13, subtherapeutic  6/6 0239 HL 0.24, subtherapeutic 6/6 1004 HL 0.28, subtherapeutic   Plan:  --Will repeat bolus of 800 units x 1 and increase infusion to 1300 units/hr --Check HL 6 hrs after rate change --CBC daily while on heparin  Tressie Ellis 12/05/2020 11:18 AM

## 2020-12-05 NOTE — Progress Notes (Signed)
ANTICOAGULATION CONSULT NOTE  Pharmacy Consult for heparin infusion Indication: chest pain/ACS/STEMI  No Known Allergies  Patient Measurements: Height: 5\' 9"  (175.3 cm) Weight: 53.4 kg (117 lb 11.2 oz) IBW/kg (Calculated) : 70.7 Heparin Dosing Weight: 53.4 kg  Vital Signs: Temp: 98.7 F (37.1 C) (06/05 2000) Temp Source: Oral (06/05 2000) BP: 99/82 (06/05 2000) Pulse Rate: 97 (06/05 2000)  Labs: Recent Labs    12/03/20 2049 12/03/20 2304 12/04/20 0432 12/04/20 1058 12/04/20 1905 12/05/20 0239  HGB 14.9  --  15.5  --   --  15.3  HCT 43.8  --  44.1  --   --  43.7  PLT 326  --  326  --   --  277  APTT  --   --  33  --   --   --   LABPROT  --   --  12.6  --   --   --   INR  --   --  0.9  --   --   --   HEPARINUNFRC  --   --   --  <0.10* 0.13* 0.24*  CREATININE 1.09  --  1.02  --   --  0.93  TROPONINIHS 776* >24,000*  --   --   --   --     Estimated Creatinine Clearance: 63 mL/min (by C-G formula based on SCr of 0.93 mg/dL).   Medical History: Past Medical History:  Diagnosis Date  . Asthma   . COPD (chronic obstructive pulmonary disease) (HCC)     Medications:   Ticagrelor 90 mg BID/asa 81 Aggrastat (6/4)  Assessment: Pt is 60 yo male, post cardiac cath on 6/4 and hx of CAD, admitted with  "acute anterior wall ST segment elevation MI secondary to LAD stent occlusion after 1 week from stent placement, likely due to noncompliance, status post PCI and expansion."  Goal of Therapy:  Heparin level 0.3-0.7 units/ml Monitor platelets by anticoagulation protocol: Yes  No original bolus per MD - heparin started at 700 units/hr  6/5 1058 HL < 0.10 - subtherapeutic, increased to 900 units/hr 6/5 1905 HL 0.13, subtherapeutic  6/6 0239 HL 0.24, subtherapeutic   Plan:  Will repeat bolus of 800 units x 1 and increase infusion to 1200 units/hr. Check HL 6 hrs after rate change CBC daily while on heparin.  8/4, PharmD, [facility] 12/05/2020 3:23 AM

## 2020-12-05 NOTE — Progress Notes (Signed)
*  PRELIMINARY RESULTS* Echocardiogram 2D Echocardiogram has been performed.  Cristela Blue 12/05/2020, 8:01 AM

## 2020-12-05 NOTE — Progress Notes (Signed)
Patient Name: Peter Page Date of Encounter: 12/05/2020  Hospital Problem List     Active Problems:   Acute anterior wall MI [facility])   STEMI (ST elevation myocardial infarction) [facility])   Chest pain    Patient Profile       60 year old male with history of coronary disease status post recent anterior myocardial infarction with placement of a drug-eluting stent in the LAD on 11/28/2020.  Cardiac cath at that time showed two-vessel coronary disease with an ulcerated 99% stenosis in the proximal to mid LAD and a 75% stenosis in the OM 3 with an EF mildly reduced.  He had moderate to severe MR at the time.  He had a resolute Onyx 2.75 x 18 mm stent placed in the proximal to mid LAD.  He was discharged on Brilinta.  He returned to the ER yesterday with recurrent chest pain.  EKG suggested anterior STEMI.  Cardiac cath done urgently revealed 100% in-stent thrombosis with again a 70% stenosis in the third marginal and severe disease in the RCA.  Patient underwent balloon angioplasty with a 3.75 x 12 mm balloon.  Was taken up to 3.91 mm.  He had elevated LVEDP.  He was placed back on Brilinta.  It is unclear whether he was compliant with his Brilinta which she had been discharged on several days earlier.  Subjective   Feels better. Less chest pain  Inpatient Medications    . aspirin  81 mg Oral Daily  . atorvastatin  80 mg Oral Daily  . Chlorhexidine Gluconate Cloth  6 each Topical Daily  . famotidine  20 mg Oral BID  . feeding supplement  237 mL Oral TID BM  . multivitamin with minerals  1 tablet Oral Daily  . sodium chloride flush  3 mL Intravenous Q12H  . ticagrelor  90 mg Oral BID    Vital Signs    Vitals:   12/05/20 0900 12/05/20 1000 12/05/20 1100 12/05/20 1200  BP: 119/81 110/78 113/78   Pulse: 80 78 (!) 109 69  Resp: 20 (!) 23 (!) 25 (!) 23  Temp:    98.1 F (36.7 C)  TempSrc:    Oral  SpO2: 96% 94% 92% 97%  Weight:      Height:        Intake/Output Summary (Last 24  hours) at 12/05/2020 1544 Last data filed at 12/05/2020 1353 Gross per 24 hour  Intake 1511.19 ml  Output 710 ml  Net 801.19 ml   Filed Weights   12/03/20 2043  Weight: 53.4 kg    Physical Exam    GEN: Well nourished, well developed, in no acute distress.  HEENT: normal.  Neck: Supple, no JVD, carotid bruits, or masses. Cardiac: RRR, no murmurs, rubs, or gallops. No clubbing, cyanosis, edema.  Radials/DP/PT 2+ and equal bilaterally.  Respiratory:  Respirations regular and unlabored, clear to auscultation bilaterally. GI: Soft, nontender, nondistended, BS + x 4. MS: no deformity or atrophy. Skin: warm and dry, no rash. Neuro:  Strength and sensation are intact. Psych: Normal affect.  Labs    CBC Recent Labs    12/04/20 0432 12/05/20 0239  WBC 11.2* 8.6  HGB 15.5 15.3  HCT 44.1 43.7  MCV 88.9 89.0  PLT 326 277   Basic Metabolic Panel Recent Labs    16/10/96 0432 12/05/20 0239  NA 137 136  K 3.7 3.7  CL 103 105  CO2 21* 21*  GLUCOSE 123* 96  BUN 17 16  CREATININE  1.02 0.93  CALCIUM 8.6* 8.1*   Liver Function Tests No results for input(s): AST, ALT, ALKPHOS, BILITOT, PROT, ALBUMIN in the last 72 hours. No results for input(s): LIPASE, AMYLASE in the last 72 hours. Cardiac Enzymes No results for input(s): CKTOTAL, CKMB, CKMBINDEX, TROPONINI in the last 72 hours. BNP No results for input(s): BNP in the last 72 hours. D-Dimer No results for input(s): DDIMER in the last 72 hours. Hemoglobin A1C No results for input(s): HGBA1C in the last 72 hours. Fasting Lipid Panel No results for input(s): CHOL, HDL, LDLCALC, TRIG, CHOLHDL, LDLDIRECT in the last 72 hours. Thyroid Function Tests No results for input(s): TSH, T4TOTAL, T3FREE, THYROIDAB in the last 72 hours.  Invalid input(s): FREET3  Telemetry    nsr  ECG    nsr with evolving anterolateral mi  Radiology    CARDIAC CATHETERIZATION  Addendum Date: 12/03/2020    3rd Mrg lesion is 75% stenosed.  Prox  RCA-1 lesion is 30% stenosed.  Prox RCA-2 lesion is 20% stenosed.  Mid LAD lesion is 100% stenosed. Stent thrombosis of LAD stent placed earlier in the week.  Thombectomy restored TIMI 3 flow. Then IVUS showed the prior stent was undeployed. Balloon angioplasty was performed first with a 3.25 mm balloon. The thrombus decreased significantly.  Final Balloon angioplasty was performed using a BALLOON [place] TREK RX [id number], up to 3.91 mm. Sizing was optimized with IVUS.  Post intervention, there is a 0% residual stenosis.  There is no aortic valve stenosis.  LV end diastolic pressure is severely elevated.  Patient was reloaded with Brilinta.  There was some question as to whether he was taking the Brilinta once a day or twice a day at home.  Apparently, it was also an issue at the end of his last hospitalization where he felt all of the medicines were making him sick.  We will have to try to get history from family members to see whether or not he truly was taking his medicines.  Regardless, stent size was optimized with larger postdilatation balloons.  I tried to avoid additional stent placement given the potential issues that he had with taking antiplatelet therapy. Elevated LVEDP.  IV Lasix was given.  He will need medical therapy for LV dysfunction most likely. I did leave a message for his daughter who is listed as emergency contact with his location.   Result Date: 12/03/2020  3rd Mrg lesion is 75% stenosed.  Prox RCA-1 lesion is 30% stenosed.  Prox RCA-2 lesion is 20% stenosed.  Mid LAD lesion is 100% stenosed. Stent thrombosis of LAD stent placed earlier in the week.  Thombectomy restored TIMI 3 flow. Then IVUS showed the prior stent was undeployed. Balloon angioplasty was performed first with a 3.25 mm balloon. The thrombus decreased significantly.  Final Balloon angioplasty was performed using a BALLOON [place] TREK RX 3.75X12, up to 3.91 mm. Sizing was optimized with IVUS.  Post intervention, there is  a 0% residual stenosis.  There is no aortic valve stenosis.  LV end diastolic pressure is severely elevated.  Patient was reloaded with Brilinta.  There was some question as to whether he was taking the Brilinta once a day or twice a day at home.  Apparently, it was also an issue at the end of his last hospitalization where he felt all of the medicines were making him sick.  We will have to try to get history from family members to see whether or not he truly was taking his medicines.  Regardless, stent size was optimized with larger postdilatation balloons. Elevated LVEDP.  IV Lasix was given.  He will need medical therapy for LV dysfunction most likely.   CARDIAC CATHETERIZATION  Result Date: 11/28/2020  Prox RCA-1 lesion is 30% stenosed.  Prox RCA-2 lesion is 20% stenosed.  3rd Mrg lesion is 75% stenosed.  Prox LAD to Mid LAD lesion is 99% stenosed.  A drug-eluting stent was successfully placed using a STENT RESOLUTE ONYX [id number].  Post intervention, there is a 0% residual stenosis.  There is moderate (3+) mitral regurgitation.  1.  Anterior STEMI 2.  Two-vessel coronary artery disease with ulcerated 99% stenosis proximal/mid LAD and 75% stenosis OM 3 3.  Mildly reduced left ventricular function with anteroapical akinesis 4.  Moderate to severe mitral regurgitation 5.  Successful primary PCI with DES proximal/mid LAD Recommendations 1.  Dual antiplatelet therapy uninterrupted for 1 year 2.  High intensity atorvastatin 80 mg daily 3.  Metoprolol tartrate 25 mg twice daily 4.  2D echocardiogram   DG Chest Portable 1 View  Result Date: 12/03/2020 CLINICAL DATA:  Left-sided chest pain. EXAM: PORTABLE CHEST 1 VIEW COMPARISON:  Nov 28, 2020 FINDINGS: The lungs are hyperinflated. Diffuse, chronic appearing increased interstitial lung markings are seen. Stable areas of bilateral upper lobe emphysematous lung disease and biapical architectural distortion are noted. There is no evidence of a pleural  effusion or pneumothorax. The heart size and mediastinal contours are within normal limits. Both lungs are clear. The visualized skeletal structures are unremarkable. IMPRESSION: COPD without acute cardiopulmonary disease. Electronically Signed   By: Aram Candela M.D.   On: 12/03/2020 21:41   DG Chest Portable 1 View  Result Date: 11/28/2020 CLINICAL DATA:  ST elevation myocardial infarction EXAM: PORTABLE CHEST 1 VIEW COMPARISON:  05/15/2020 FINDINGS: Hyperinflation and interstitial coarsening with biapical architectural distortion. There is hazy density bilaterally, nodule not excluded. Artifact from EKG leads and defibrillator pads. Normal heart size and stable aortic contours. IMPRESSION: 1. Recommend follow-up chest CT to evaluate hazy density at the apices. 2. Emphysema. 3. No acute finding related to the history of myocardial infarction. Electronically Signed   By: Marnee Spring M.D.   On: 11/28/2020 05:19   ECHOCARDIOGRAM COMPLETE  Result Date: 12/05/2020    ECHOCARDIOGRAM REPORT   Patient Name:   THAXTON PELLEY Date of Exam: 12/05/2020 Medical Rec #:  [id number]       Height:       69.0 in Accession #:    [id number]      Weight:       117.7 lb Date of Birth:  02-18-1961       BSA:          1.649 m Patient Age:    60 years        BP:           122/77 mmHg Patient Gender: M               HR:           77 bpm. Exam Location:  [facility] Procedure: 2D Echo, Cardiac Doppler, Color Doppler and Strain Analysis Indications:     Acute myocardial infarction -unspecified I21.9  History:         Patient has prior history of Echocardiogram examinations, most                  recent 11/28/2020. COPD.  Sonographer:     Cristela Blue RDCS (AE) Referring Phys:  Harold Hedge  MD Diagnosing Phys: Marcina MillardAlexander Paraschos MD  Sonographer Comments: Global longitudinal strain was attempted. IMPRESSIONS  1. Left ventricular ejection fraction, by estimation, is 35 to 40%. The left ventricle has moderately decreased function. The left  ventricle demonstrates regional wall motion abnormalities (see scoring diagram/findings for description). Left ventricular  diastolic parameters are consistent with Grade I diastolic dysfunction (impaired relaxation).  2. Right ventricular systolic function is normal. The right ventricular size is normal.  3. The mitral valve is normal in structure. Trivial mitral valve regurgitation. No evidence of mitral stenosis.  4. The aortic valve is normal in structure. Aortic valve regurgitation is not visualized. No aortic stenosis is present.  5. The inferior vena cava is normal in size with greater than 50% respiratory variability, suggesting right atrial pressure of 3 mmHg. FINDINGS  Left Ventricle: Left ventricular ejection fraction, by estimation, is 35 to 40%. The left ventricle has moderately decreased function. The left ventricle demonstrates regional wall motion abnormalities. The left ventricular internal cavity size was normal in size. There is no left ventricular hypertrophy. Left ventricular diastolic parameters are consistent with Grade I diastolic dysfunction (impaired relaxation).  LV Wall Scoring: The mid and distal anterior wall, mid and distal anterior septum, apical lateral segment, mid inferoseptal segment, and apex are akinetic. The basal anteroseptal segment, basal anterior segment, and basal inferoseptal segment are hypokinetic. Right Ventricle: The right ventricular size is normal. No increase in right ventricular wall thickness. Right ventricular systolic function is normal. Left Atrium: Left atrial size was normal in size. Right Atrium: Right atrial size was normal in size. Pericardium: There is no evidence of pericardial effusion. Mitral Valve: The mitral valve is normal in structure. Trivial mitral valve regurgitation. No evidence of mitral valve stenosis. Tricuspid Valve: The tricuspid valve is normal in structure. Tricuspid valve regurgitation is trivial. No evidence of tricuspid stenosis.  Aortic Valve: The aortic valve is normal in structure. Aortic valve regurgitation is not visualized. No aortic stenosis is present. Aortic valve mean gradient measures 2.0 mmHg. Aortic valve peak gradient measures 3.2 mmHg. Aortic valve area, by VTI measures 2.43 cm. Pulmonic Valve: The pulmonic valve was normal in structure. Pulmonic valve regurgitation is not visualized. No evidence of pulmonic stenosis. Aorta: The aortic root is normal in size and structure. Venous: The inferior vena cava is normal in size with greater than 50% respiratory variability, suggesting right atrial pressure of 3 mmHg. IAS/Shunts: No atrial level shunt detected by color flow Doppler.  LEFT VENTRICLE PLAX 2D LVIDd:         4.23 cm     Diastology LVIDs:         3.51 cm     LV e' medial:    5.22 cm/s LV PW:         1.01 cm     LV E/e' medial:  10.7 LV IVS:        0.75 cm     LV e' lateral:   6.96 cm/s LVOT diam:     2.10 cm     LV E/e' lateral: 8.0 LV SV:         36 LV SV Index:   22 LVOT Area:     3.46 cm  LV Volumes (MOD) LV vol d, MOD A2C: 77.6 ml LV vol d, MOD A4C: 74.4 ml LV vol s, MOD A2C: 50.8 ml LV vol s, MOD A4C: 58.8 ml LV SV MOD A2C:     26.8 ml LV SV MOD A4C:  74.4 ml LV SV MOD BP:      20.6 ml RIGHT VENTRICLE RV Basal diam:  2.25 cm RV S prime:     12.20 cm/s TAPSE (M-mode): 3.3 cm LEFT ATRIUM           Index       RIGHT ATRIUM          Index LA diam:      3.00 cm 1.82 cm/m  RA Area:     8.55 cm LA Vol (A2C): 38.7 ml 23.47 ml/m RA Volume:   17.40 ml 10.55 ml/m LA Vol (A4C): 38.0 ml 23.04 ml/m  AORTIC VALVE                   PULMONIC VALVE AV Area (Vmax):    2.67 cm    PV Vmax:        1.02 m/s AV Area (Vmean):   2.36 cm    PV Peak grad:   4.2 mmHg AV Area (VTI):     2.43 cm    RVOT Peak grad: 6 mmHg AV Vmax:           89.65 cm/s AV Vmean:          64.400 cm/s AV VTI:            0.148 m AV Peak Grad:      3.2 mmHg AV Mean Grad:      2.0 mmHg LVOT Vmax:         69.20 cm/s LVOT Vmean:        43.900 cm/s LVOT VTI:           0.104 m LVOT/AV VTI ratio: 0.70  AORTA Ao Root diam: 3.73 cm MITRAL VALVE               TRICUSPID VALVE MV Area (PHT): 4.74 cm    TR Peak grad:   20.1 mmHg MV Decel Time: 160 msec    TR Vmax:        224.00 cm/s MV E velocity: 55.70 cm/s MV A velocity: 72.40 cm/s  SHUNTS MV E/A ratio:  0.77        Systemic VTI:  0.10 m                            Systemic Diam: 2.10 cm Marcina Millard MD Electronically signed by Marcina Millard MD Signature Date/Time: 12/05/2020/3:19:37 PM    Final    ECHOCARDIOGRAM COMPLETE  Result Date: 11/28/2020    ECHOCARDIOGRAM REPORT   Patient Name:   ABBAS BEYENE Date of Exam: 11/28/2020 Medical Rec #:  [id number]       Height:       69.0 in Accession #:    [id number]      Weight:       122.4 lb Date of Birth:  [birth year]       BSA:          1.677 m Patient Age:    60 years        BP:           129/76 mmHg Patient Gender: M               HR:           61 bpm. Exam Location:  [facility] Procedure: 2D Echo and Strain Analysis Indications:     AMI I21.9  History:         Patient  has prior history of Echocardiogram examinations, most                  recent 05/17/2020.  Sonographer:     Overton Mam RDCS Referring Phys:  [id number] Lyn Hollingshead PARASCHOS Diagnosing Phys: Marcina Millard MD  Sonographer Comments: Global longitudinal strain was attempted. IMPRESSIONS  1. Left ventricular ejection fraction, by estimation, is 40 to 45%. The left ventricle has mildly decreased function. The left ventricle demonstrates regional wall motion abnormalities (see scoring diagram/findings for description). Left ventricular diastolic parameters were normal.  2. Right ventricular systolic function is normal. The right ventricular size is normal.  3. The mitral valve is normal in structure. Mild mitral valve regurgitation. No evidence of mitral stenosis.  4. Tricuspid valve regurgitation is mild to moderate.  5. The aortic valve is normal in structure. Aortic valve regurgitation is not visualized. No  aortic stenosis is present.  6. The inferior vena cava is normal in size with greater than 50% respiratory variability, suggesting right atrial pressure of 3 mmHg. FINDINGS  Left Ventricle: Left ventricular ejection fraction, by estimation, is 40 to 45%. The left ventricle has mildly decreased function. The left ventricle demonstrates regional wall motion abnormalities. The left ventricular internal cavity size was normal in size. There is no left ventricular hypertrophy. Left ventricular diastolic parameters were normal.  LV Wall Scoring: The entire apex is akinetic. Right Ventricle: The right ventricular size is normal. No increase in right ventricular wall thickness. Right ventricular systolic function is normal. Left Atrium: Left atrial size was normal in size. Right Atrium: Right atrial size was normal in size. Pericardium: There is no evidence of pericardial effusion. Mitral Valve: The mitral valve is normal in structure. Mild mitral valve regurgitation. No evidence of mitral valve stenosis. Tricuspid Valve: The tricuspid valve is normal in structure. Tricuspid valve regurgitation is mild to moderate. No evidence of tricuspid stenosis. Aortic Valve: The aortic valve is normal in structure. Aortic valve regurgitation is not visualized. No aortic stenosis is present. Aortic valve peak gradient measures 3.6 mmHg. Pulmonic Valve: The pulmonic valve was normal in structure. Pulmonic valve regurgitation is not visualized. No evidence of pulmonic stenosis. Aorta: The aortic root is normal in size and structure. Venous: The inferior vena cava is normal in size with greater than 50% respiratory variability, suggesting right atrial pressure of 3 mmHg. IAS/Shunts: No atrial level shunt detected by color flow Doppler.  LEFT VENTRICLE PLAX 2D LVIDd:         4.34 cm     Diastology LVIDs:         3.37 cm     LV e' medial:    7.51 cm/s LV PW:         1.07 cm     LV E/e' medial:  11.0 LV IVS:        0.91 cm     LV e' lateral:    9.36 cm/s LVOT diam:     2.20 cm     LV E/e' lateral: 8.8 LV SV:         55 LV SV Index:   33 LVOT Area:     3.80 cm  LV Volumes (MOD) LV vol d, MOD A2C: 75.2 ml LV vol d, MOD A4C: 92.0 ml LV vol s, MOD A2C: 36.9 ml LV vol s, MOD A4C: 45.1 ml LV SV MOD A2C:     38.3 ml LV SV MOD A4C:     92.0 ml LV SV MOD BP:  48.7 ml RIGHT VENTRICLE RV Basal diam:  2.99 cm RV S prime:     8.49 cm/s TAPSE (M-mode): 1.5 cm LEFT ATRIUM             Index       RIGHT ATRIUM          Index LA diam:        3.40 cm 2.03 cm/m  RA Area:     7.82 cm LA Vol (A2C):   30.0 ml 17.89 ml/m RA Volume:   14.10 ml 8.41 ml/m LA Vol (A4C):   25.6 ml 15.27 ml/m LA Biplane Vol: 30.1 ml 17.95 ml/m  AORTIC VALVE                PULMONIC VALVE AV Area (Vmax): 2.90 cm    PV Vmax:       0.76 m/s AV Vmax:        95.00 cm/s  PV Peak grad:  2.3 mmHg AV Peak Grad:   3.6 mmHg LVOT Vmax:      72.50 cm/s LVOT Vmean:     47.400 cm/s LVOT VTI:       0.145 m  AORTA Ao Root diam: 3.20 cm MITRAL VALVE               TRICUSPID VALVE MV Area (PHT): 4.19 cm    TV Peak grad:   23.3 mmHg MV Decel Time: 181 msec    TV Vmax:        2.42 m/s MV E velocity: 82.30 cm/s MV A velocity: 51.00 cm/s  SHUNTS MV E/A ratio:  1.61        Systemic VTI:  0.14 m                            Systemic Diam: 2.20 cm Marcina Millard MD Electronically signed by Marcina Millard MD Signature Date/Time: 11/28/2020/1:19:07 PM    Final     Assessment & Plan     60 year old male with history of coronary disease status post LAD STEMI earlier this week with placement of a drug-eluting stent.  Now had recurrence with acute in-stent restenosis.  Unclear whether the patient was compliant with his Brilinta.  Stent was upsized by balloon and he was placed back on his Brilinta and aspirin and heparin.    1.  Coronary disease-status post recurrent anterior myocardial infarction due to in-stent restenosis.  May have been secondary to noncompliance with Brilinta.  Has been placed back on  this.  Will have again stressed  compliance with this.  We will hold beta-blocker due to soft blood pressure and follow.  Echo revealed ef of 35-40% with anteroapical and anterolateral akinesis.  Will discontinue heparin and follow over night. If stable would transfer to telemetry and ambulate.   Signed, Darlin Priestly Lynnsie Linders MD 12/05/2020, 3:44 PM  Pager: [phone]

## 2020-12-06 ENCOUNTER — Encounter: Payer: Self-pay | Admitting: Anesthesiology

## 2020-12-06 ENCOUNTER — Encounter: Payer: Self-pay | Admitting: Family Medicine

## 2020-12-06 DIAGNOSIS — I2109 ST elevation (STEMI) myocardial infarction involving other coronary artery of anterior wall: Secondary | ICD-10-CM

## 2020-12-06 LAB — CBC
HCT: 43.4 % (ref 39.0–52.0)
Hemoglobin: 14.8 g/dL (ref 13.0–17.0)
MCH: 30.4 pg (ref 26.0–34.0)
MCHC: 34.1 g/dL (ref 30.0–36.0)
MCV: 89.1 fL (ref 80.0–100.0)
Platelets: 296 10*3/uL (ref 150–400)
RBC: 4.87 MIL/uL (ref 4.22–5.81)
RDW: 13.5 % (ref 11.5–15.5)
WBC: 8.7 10*3/uL (ref 4.0–10.5)
nRBC: 0 % (ref 0.0–0.2)

## 2020-12-06 MED ORDER — METOPROLOL SUCCINATE ER 50 MG PO TB24
25.0000 mg | ORAL_TABLET | Freq: Every day | ORAL | Status: DC
  Administered 2020-12-06: 25 mg via ORAL
  Filled 2020-12-06: qty 1

## 2020-12-06 MED ORDER — METOPROLOL SUCCINATE ER 25 MG PO TB24: 25.0000 mg | ORAL_TABLET | Freq: Every day | ORAL | 1 refills | Status: AC

## 2020-12-06 NOTE — Progress Notes (Signed)
Patient ambulated without assistance approximately 300 ft around unit with nurse tech without any chest pain, SOB, diaphoresis, or nausea.

## 2020-12-06 NOTE — Plan of Care (Signed)
Patient ready for discharge. Has ambulated around unit without assistance and did not have any recurrence of chest pain, SOB, diaphoresis, or N/V. Patient understands importance of continuing to take his medications after discharge to ensure that he does not occlude his stent a second time. Patient has been on RA for over 36 hours with adequate oxygenation. Blood pressures have increased to SBPs in 120s and 130s. Patient eating meals without any nausea. Voiding without assistance using urinal. Awaiting discharge paperwork at this time. Patient aware that plan is to discharge today.

## 2020-12-06 NOTE — Progress Notes (Signed)
Discharge instructions and medications reviewed with patient. Importance of taking medications as prescribed again reviewed with patient. Patient verbalized understanding. IVs removed. Patient's belongings returned to patient. Patient taken to medical mall entrance via wheelchair without incident by NT.

## 2020-12-06 NOTE — Discharge Summary (Signed)
Physician Discharge Summary  Peter Page ZOX:[id number] DOB: 08/28/1960 DOA: 12/03/2020  PCP: Patient, No Pcp Per (Inactive)  Admit date: 12/03/2020 Discharge date: 12/06/2020  Admitted From: Home Disposition: Home  Recommendations for Outpatient Follow-up:  1. Follow up with PCP in 1-2 weeks 2. Follow-up with cardiology within 1 week 3. Follow-up with cardiac rehab 4. Please obtain BMP/CBC in one week 5. Please follow up on the following pending results: None  Home Health: No Equipment/Devices: None  discharge Condition: Stable CODE STATUS: Full Diet recommendation: Heart Healthy   Brief/Interim Summary: Peter Page a 60 y.o.Caucasian malewith medical history significant forCoronary artery disease and recent anterior STEMI about a week ago for which he PCI and LAD stent, patient was discharged on 11/30/2020.  There was some concern of being noncompliant but according to patient he was taking all of his medications regularly.  Started getting left-sided chest pain with associated nausea, vomiting and diaphoresis.  Found to have ST elevation in anterolateral leads, taken to Cath Lab emergently for STEMI of anterior wall, found to have extension of his LAD lesion with occlusion of prior stents.  Another PCI done.  Ejection fraction reduced to 35 to 40% with anteroapical and anterolateral hypokinesis. Patient was able to ambulate without any chest pain or shortness of breath.  He was advised again to continue with DAPT and statin.  He was started on low-dose beta-blocker, ACE inhibitor or ARB was deferred for outpatient due to borderline blood pressure.  He will follow-up closely with cardiology for further recommendations and participate with cardiac rehab.  Patient also has severe protein caloric malnutrition.  Advised to use some supplement.  Patient will continue with rest of his home medications and follow-up with his providers.  Discharge Diagnoses:  Active Problems:   Acute  anterior wall MI (HCC)   STEMI (ST elevation myocardial infarction) [facility])   Chest pain   Discharge Instructions  Discharge Instructions    (HEART FAILURE PATIENTS) Call MD:  Anytime you have any of the following symptoms: 1) 3 pound weight gain in 24 hours or 5 pounds in 1 week 2) shortness of breath, with or without a dry hacking cough 3) swelling in the hands, feet or stomach 4) if you have to sleep on extra pillows at night in order to breathe.   Complete by: As directed    AMB Referral to Cardiac Rehabilitation - Phase II   Complete by: As directed    Diagnosis: STEMI   After initial evaluation and assessments completed: Virtual Based Care may be provided alone or in conjunction with Phase 2 Cardiac Rehab based on patient barriers.: Yes   Diet - low sodium heart healthy   Complete by: As directed    Discharge instructions   Complete by: As directed    It was pleasure taking care of you. Continue taking your medications as directed and follow-up closely with your cardiologist. Continue with cardiac rehab.   Increase activity slowly   Complete by: As directed      Allergies as of 12/06/2020   No Known Allergies     Medication List    TAKE these medications   aspirin 81 MG EC tablet Take 1 tablet (81 mg total) by mouth daily. Swallow whole.   atorvastatin 80 MG tablet Commonly known as: LIPITOR Take 1 tablet (80 mg total) by mouth daily.   famotidine 20 MG tablet Commonly known as: PEPCID Take 1 tablet (20 mg total) by mouth 2 (two) times daily.  feeding supplement Liqd Take 237 mLs by mouth 3 (three) times daily between meals.   metoprolol succinate 25 MG 24 hr tablet Commonly known as: TOPROL-XL Take 1 tablet (25 mg total) by mouth daily. Take with or immediately following a meal. What changed:   how much to take  additional instructions   multivitamin with minerals Tabs tablet Take 1 tablet by mouth daily.   nitroGLYCERIN 0.4 MG SL tablet Commonly known  as: NITROSTAT Place 1 tablet (0.4 mg total) under the tongue every 5 (five) minutes x 3 doses as needed for chest pain.   ticagrelor 90 MG Tabs tablet Commonly known as: BRILINTA Take 1 tablet (90 mg total) by mouth 2 (two) times daily.       Follow-up Information    Paraschos, Alexander, MD Follow up in 1 week(s).   Specialty: Cardiology Contact information: [address] [facility] [place] [address] [phone]              No Known Allergies  Consultations:  Cardiology  Procedures/Studies: CARDIAC CATHETERIZATION  Addendum Date: 12/03/2020    3rd Mrg lesion is 75% stenosed.  Prox RCA-1 lesion is 30% stenosed.  Prox RCA-2 lesion is 20% stenosed.  Mid LAD lesion is 100% stenosed. Stent thrombosis of LAD stent placed earlier in the week.  Thombectomy restored TIMI 3 flow. Then IVUS showed the prior stent was undeployed. Balloon angioplasty was performed first with a 3.25 mm balloon. The thrombus decreased significantly.  Final Balloon angioplasty was performed using a BALLOON [place] TREK RX [id number], up to 3.91 mm. Sizing was optimized with IVUS.  Post intervention, there is a 0% residual stenosis.  There is no aortic valve stenosis.  LV end diastolic pressure is severely elevated.  Patient was reloaded with Brilinta.  There was some question as to whether he was taking the Brilinta once a day or twice a day at home.  Apparently, it was also an issue at the end of his last hospitalization where he felt all of the medicines were making him sick.  We will have to try to get history from family members to see whether or not he truly was taking his medicines.  Regardless, stent size was optimized with larger postdilatation balloons.  I tried to avoid additional stent placement given the potential issues that he had with taking antiplatelet therapy. Elevated LVEDP.  IV Lasix was given.  He will need medical therapy for LV dysfunction most likely. I did  leave a message for his daughter who is listed as emergency contact with his location.   Result Date: 12/03/2020  3rd Mrg lesion is 75% stenosed.  Prox RCA-1 lesion is 30% stenosed.  Prox RCA-2 lesion is 20% stenosed.  Mid LAD lesion is 100% stenosed. Stent thrombosis of LAD stent placed earlier in the week.  Thombectomy restored TIMI 3 flow. Then IVUS showed the prior stent was undeployed. Balloon angioplasty was performed first with a 3.25 mm balloon. The thrombus decreased significantly.  Final Balloon angioplasty was performed using a BALLOON [place] TREK RX 3.75X12, up to 3.91 mm. Sizing was optimized with IVUS.  Post intervention, there is a 0% residual stenosis.  There is no aortic valve stenosis.  LV end diastolic pressure is severely elevated.  Patient was reloaded with Brilinta.  There was some question as to whether he was taking the Brilinta once a day or twice a day at home.  Apparently, it was also an issue at the end of his last  hospitalization where he felt all of the medicines were making him sick.  We will have to try to get history from family members to see whether or not he truly was taking his medicines.  Regardless, stent size was optimized with larger postdilatation balloons. Elevated LVEDP.  IV Lasix was given.  He will need medical therapy for LV dysfunction most likely.   CARDIAC CATHETERIZATION  Result Date: 11/28/2020  Prox RCA-1 lesion is 30% stenosed.  Prox RCA-2 lesion is 20% stenosed.  3rd Mrg lesion is 75% stenosed.  Prox LAD to Mid LAD lesion is 99% stenosed.  A drug-eluting stent was successfully placed using a STENT RESOLUTE ONYX [id number].  Post intervention, there is a 0% residual stenosis.  There is moderate (3+) mitral regurgitation.  1.  Anterior STEMI 2.  Two-vessel coronary artery disease with ulcerated 99% stenosis proximal/mid LAD and 75% stenosis OM 3 3.  Mildly reduced left ventricular function with anteroapical akinesis 4.  Moderate to severe mitral  regurgitation 5.  Successful primary PCI with DES proximal/mid LAD Recommendations 1.  Dual antiplatelet therapy uninterrupted for 1 year 2.  High intensity atorvastatin 80 mg daily 3.  Metoprolol tartrate 25 mg twice daily 4.  2D echocardiogram   DG Chest Portable 1 View  Result Date: 12/03/2020 CLINICAL DATA:  Left-sided chest pain. EXAM: PORTABLE CHEST 1 VIEW COMPARISON:  Nov 28, 2020 FINDINGS: The lungs are hyperinflated. Diffuse, chronic appearing increased interstitial lung markings are seen. Stable areas of bilateral upper lobe emphysematous lung disease and biapical architectural distortion are noted. There is no evidence of a pleural effusion or pneumothorax. The heart size and mediastinal contours are within normal limits. Both lungs are clear. The visualized skeletal structures are unremarkable. IMPRESSION: COPD without acute cardiopulmonary disease. Electronically Signed   By: Aram Candela M.D.   On: 12/03/2020 21:41   DG Chest Portable 1 View  Result Date: 11/28/2020 CLINICAL DATA:  ST elevation myocardial infarction EXAM: PORTABLE CHEST 1 VIEW COMPARISON:  05/15/2020 FINDINGS: Hyperinflation and interstitial coarsening with biapical architectural distortion. There is hazy density bilaterally, nodule not excluded. Artifact from EKG leads and defibrillator pads. Normal heart size and stable aortic contours. IMPRESSION: 1. Recommend follow-up chest CT to evaluate hazy density at the apices. 2. Emphysema. 3. No acute finding related to the history of myocardial infarction. Electronically Signed   By: Marnee Spring M.D.   On: 11/28/2020 05:19   ECHOCARDIOGRAM COMPLETE  Result Date: 12/05/2020    ECHOCARDIOGRAM REPORT   Patient Name:   Peter Page Date of Exam: 12/05/2020 Medical Rec #:  [id number]       Height:       69.0 in Accession #:    [id number]      Weight:       117.7 lb Date of Birth:  Jul 18, 1960       BSA:          1.649 m Patient Age:    60 years        BP:           122/77 mmHg  Patient Gender: M               HR:           77 bpm. Exam Location:  [facility] Procedure: 2D Echo, Cardiac Doppler, Color Doppler and Strain Analysis Indications:     Acute myocardial infarction -unspecified I21.9  History:         Patient has prior history of Echocardiogram  examinations, most                  recent 11/28/2020. COPD.  Sonographer:     Cristela Blue RDCS (AE) Referring Phys:  Harold Hedge MD Diagnosing Phys: Marcina Millard MD  Sonographer Comments: Global longitudinal strain was attempted. IMPRESSIONS  1. Left ventricular ejection fraction, by estimation, is 35 to 40%. The left ventricle has moderately decreased function. The left ventricle demonstrates regional wall motion abnormalities (see scoring diagram/findings for description). Left ventricular  diastolic parameters are consistent with Grade I diastolic dysfunction (impaired relaxation).  2. Right ventricular systolic function is normal. The right ventricular size is normal.  3. The mitral valve is normal in structure. Trivial mitral valve regurgitation. No evidence of mitral stenosis.  4. The aortic valve is normal in structure. Aortic valve regurgitation is not visualized. No aortic stenosis is present.  5. The inferior vena cava is normal in size with greater than 50% respiratory variability, suggesting right atrial pressure of 3 mmHg. FINDINGS  Left Ventricle: Left ventricular ejection fraction, by estimation, is 35 to 40%. The left ventricle has moderately decreased function. The left ventricle demonstrates regional wall motion abnormalities. The left ventricular internal cavity size was normal in size. There is no left ventricular hypertrophy. Left ventricular diastolic parameters are consistent with Grade I diastolic dysfunction (impaired relaxation).  LV Wall Scoring: The mid and distal anterior wall, mid and distal anterior septum, apical lateral segment, mid inferoseptal segment, and apex are akinetic. The basal anteroseptal segment,  basal anterior segment, and basal inferoseptal segment are hypokinetic. Right Ventricle: The right ventricular size is normal. No increase in right ventricular wall thickness. Right ventricular systolic function is normal. Left Atrium: Left atrial size was normal in size. Right Atrium: Right atrial size was normal in size. Pericardium: There is no evidence of pericardial effusion. Mitral Valve: The mitral valve is normal in structure. Trivial mitral valve regurgitation. No evidence of mitral valve stenosis. Tricuspid Valve: The tricuspid valve is normal in structure. Tricuspid valve regurgitation is trivial. No evidence of tricuspid stenosis. Aortic Valve: The aortic valve is normal in structure. Aortic valve regurgitation is not visualized. No aortic stenosis is present. Aortic valve mean gradient measures 2.0 mmHg. Aortic valve peak gradient measures 3.2 mmHg. Aortic valve area, by VTI measures 2.43 cm. Pulmonic Valve: The pulmonic valve was normal in structure. Pulmonic valve regurgitation is not visualized. No evidence of pulmonic stenosis. Aorta: The aortic root is normal in size and structure. Venous: The inferior vena cava is normal in size with greater than 50% respiratory variability, suggesting right atrial pressure of 3 mmHg. IAS/Shunts: No atrial level shunt detected by color flow Doppler.  LEFT VENTRICLE PLAX 2D LVIDd:         4.23 cm     Diastology LVIDs:         3.51 cm     LV e' medial:    5.22 cm/s LV PW:         1.01 cm     LV E/e' medial:  10.7 LV IVS:        0.75 cm     LV e' lateral:   6.96 cm/s LVOT diam:     2.10 cm     LV E/e' lateral: 8.0 LV SV:         36 LV SV Index:   22 LVOT Area:     3.46 cm  LV Volumes (MOD) LV vol d, MOD A2C: 77.6 ml LV vol  d, MOD A4C: 74.4 ml LV vol s, MOD A2C: 50.8 ml LV vol s, MOD A4C: 58.8 ml LV SV MOD A2C:     26.8 ml LV SV MOD A4C:     74.4 ml LV SV MOD BP:      20.6 ml RIGHT VENTRICLE RV Basal diam:  2.25 cm RV S prime:     12.20 cm/s TAPSE (M-mode): 3.3 cm  LEFT ATRIUM           Index       RIGHT ATRIUM          Index LA diam:      3.00 cm 1.82 cm/m  RA Area:     8.55 cm LA Vol (A2C): 38.7 ml 23.47 ml/m RA Volume:   17.40 ml 10.55 ml/m LA Vol (A4C): 38.0 ml 23.04 ml/m  AORTIC VALVE                   PULMONIC VALVE AV Area (Vmax):    2.67 cm    PV Vmax:        1.02 m/s AV Area (Vmean):   2.36 cm    PV Peak grad:   4.2 mmHg AV Area (VTI):     2.43 cm    RVOT Peak grad: 6 mmHg AV Vmax:           89.65 cm/s AV Vmean:          64.400 cm/s AV VTI:            0.148 m AV Peak Grad:      3.2 mmHg AV Mean Grad:      2.0 mmHg LVOT Vmax:         69.20 cm/s LVOT Vmean:        43.900 cm/s LVOT VTI:          0.104 m LVOT/AV VTI ratio: 0.70  AORTA Ao Root diam: 3.73 cm MITRAL VALVE               TRICUSPID VALVE MV Area (PHT): 4.74 cm    TR Peak grad:   20.1 mmHg MV Decel Time: 160 msec    TR Vmax:        224.00 cm/s MV E velocity: 55.70 cm/s MV A velocity: 72.40 cm/s  SHUNTS MV E/A ratio:  0.77        Systemic VTI:  0.10 m                            Systemic Diam: 2.10 cm Marcina Millard MD Electronically signed by Marcina Millard MD Signature Date/Time: 12/05/2020/3:19:37 PM    Final    ECHOCARDIOGRAM COMPLETE  Result Date: 11/28/2020    ECHOCARDIOGRAM REPORT   Patient Name:   Peter Page Date of Exam: 11/28/2020 Medical Rec #:  [id number]       Height:       69.0 in Accession #:    [id number]      Weight:       122.4 lb Date of Birth:  [birth year]       BSA:          1.677 m Patient Age:    60 years        BP:           129/76 mmHg Patient Gender: M               HR:  61 bpm. Exam Location:  [facility] Procedure: 2D Echo and Strain Analysis Indications:     AMI I21.9  History:         Patient has prior history of Echocardiogram examinations, most                  recent 05/17/2020.  Sonographer:     Overton Mamikeshia Johnson RDCS Referring Phys:  [id number] Lyn HollingsheadALEXANDER PARASCHOS Diagnosing Phys: Marcina MillardAlexander Paraschos MD  Sonographer Comments: Global longitudinal strain was  attempted. IMPRESSIONS  1. Left ventricular ejection fraction, by estimation, is 40 to 45%. The left ventricle has mildly decreased function. The left ventricle demonstrates regional wall motion abnormalities (see scoring diagram/findings for description). Left ventricular diastolic parameters were normal.  2. Right ventricular systolic function is normal. The right ventricular size is normal.  3. The mitral valve is normal in structure. Mild mitral valve regurgitation. No evidence of mitral stenosis.  4. Tricuspid valve regurgitation is mild to moderate.  5. The aortic valve is normal in structure. Aortic valve regurgitation is not visualized. No aortic stenosis is present.  6. The inferior vena cava is normal in size with greater than 50% respiratory variability, suggesting right atrial pressure of 3 mmHg. FINDINGS  Left Ventricle: Left ventricular ejection fraction, by estimation, is 40 to 45%. The left ventricle has mildly decreased function. The left ventricle demonstrates regional wall motion abnormalities. The left ventricular internal cavity size was normal in size. There is no left ventricular hypertrophy. Left ventricular diastolic parameters were normal.  LV Wall Scoring: The entire apex is akinetic. Right Ventricle: The right ventricular size is normal. No increase in right ventricular wall thickness. Right ventricular systolic function is normal. Left Atrium: Left atrial size was normal in size. Right Atrium: Right atrial size was normal in size. Pericardium: There is no evidence of pericardial effusion. Mitral Valve: The mitral valve is normal in structure. Mild mitral valve regurgitation. No evidence of mitral valve stenosis. Tricuspid Valve: The tricuspid valve is normal in structure. Tricuspid valve regurgitation is mild to moderate. No evidence of tricuspid stenosis. Aortic Valve: The aortic valve is normal in structure. Aortic valve regurgitation is not visualized. No aortic stenosis is present.  Aortic valve peak gradient measures 3.6 mmHg. Pulmonic Valve: The pulmonic valve was normal in structure. Pulmonic valve regurgitation is not visualized. No evidence of pulmonic stenosis. Aorta: The aortic root is normal in size and structure. Venous: The inferior vena cava is normal in size with greater than 50% respiratory variability, suggesting right atrial pressure of 3 mmHg. IAS/Shunts: No atrial level shunt detected by color flow Doppler.  LEFT VENTRICLE PLAX 2D LVIDd:         4.34 cm     Diastology LVIDs:         3.37 cm     LV e' medial:    7.51 cm/s LV PW:         1.07 cm     LV E/e' medial:  11.0 LV IVS:        0.91 cm     LV e' lateral:   9.36 cm/s LVOT diam:     2.20 cm     LV E/e' lateral: 8.8 LV SV:         55 LV SV Index:   33 LVOT Area:     3.80 cm  LV Volumes (MOD) LV vol d, MOD A2C: 75.2 ml LV vol d, MOD A4C: 92.0 ml LV vol s, MOD A2C: 36.9 ml LV vol  s, MOD A4C: 45.1 ml LV SV MOD A2C:     38.3 ml LV SV MOD A4C:     92.0 ml LV SV MOD BP:      48.7 ml RIGHT VENTRICLE RV Basal diam:  2.99 cm RV S prime:     8.49 cm/s TAPSE (M-mode): 1.5 cm LEFT ATRIUM             Index       RIGHT ATRIUM          Index LA diam:        3.40 cm 2.03 cm/m  RA Area:     7.82 cm LA Vol (A2C):   30.0 ml 17.89 ml/m RA Volume:   14.10 ml 8.41 ml/m LA Vol (A4C):   25.6 ml 15.27 ml/m LA Biplane Vol: 30.1 ml 17.95 ml/m  AORTIC VALVE                PULMONIC VALVE AV Area (Vmax): 2.90 cm    PV Vmax:       0.76 m/s AV Vmax:        95.00 cm/s  PV Peak grad:  2.3 mmHg AV Peak Grad:   3.6 mmHg LVOT Vmax:      72.50 cm/s LVOT Vmean:     47.400 cm/s LVOT VTI:       0.145 m  AORTA Ao Root diam: 3.20 cm MITRAL VALVE               TRICUSPID VALVE MV Area (PHT): 4.19 cm    TV Peak grad:   23.3 mmHg MV Decel Time: 181 msec    TV Vmax:        2.42 m/s MV E velocity: 82.30 cm/s MV A velocity: 51.00 cm/s  SHUNTS MV E/A ratio:  1.61        Systemic VTI:  0.14 m                            Systemic Diam: 2.20 cm Marcina Millard MD  Electronically signed by Marcina Millard MD Signature Date/Time: 11/28/2020/1:19:07 PM    Final     Subjective: Patient was seen and examined today.  Denies any chest pain or shortness of breath.  Able to ambulate in the hallway without any difficulty.  Discharge Exam: Vitals:   12/06/20 0937 12/06/20 1053  BP: (!) 130/92 125/84  Pulse: 85 75  Resp:  (!) 21  Temp:  98.2 F (36.8 C)  SpO2:  96%   Vitals:   12/06/20 0500 12/06/20 0600 12/06/20 0937 12/06/20 1053  BP: (!) 142/80 (!) 130/95 (!) 130/92 125/84  Pulse: 81 74 85 75  Resp: (!) 25 19  (!) 21  Temp:    98.2 F (36.8 C)  TempSrc:    Oral  SpO2: 94% 95%  96%  Weight:      Height:        General: Pt is alert, awake, not in acute distress Cardiovascular: RRR, S1/S2 +, no rubs, no gallops Respiratory: CTA bilaterally, no wheezing, no rhonchi Abdominal: Soft, NT, ND, bowel sounds + Extremities: no edema, no cyanosis   The results of significant diagnostics from this hospitalization (including imaging, microbiology, ancillary and laboratory) are listed below for reference.    Microbiology: Recent Results (from the past 240 hour(s))  Resp Panel by RT-PCR (Flu A&B, Covid) Nasopharyngeal Swab     Status: None   Collection Time: 11/28/20  5:08  AM   Specimen: Nasopharyngeal Swab; Nasopharyngeal(NP) swabs in vial transport medium  Result Value Ref Range Status   SARS Coronavirus 2 by RT PCR NEGATIVE NEGATIVE Final    Comment: (NOTE) SARS-CoV-2 target nucleic acids are NOT DETECTED.  The SARS-CoV-2 RNA is generally detectable in upper respiratory specimens during the acute phase of infection. The lowest concentration of SARS-CoV-2 viral copies this assay can detect is 138 copies/mL. A negative result does not preclude SARS-Cov-2 infection and should not be used as the sole basis for treatment or other patient management decisions. A negative result may occur with  improper specimen collection/handling, submission of  specimen other than nasopharyngeal swab, presence of viral mutation(s) within the areas targeted by this assay, and inadequate number of viral copies(<138 copies/mL). A negative result must be combined with clinical observations, patient history, and epidemiological information. The expected result is Negative.  Fact Sheet for Patients:  BloggerCourse.com  Fact Sheet for Healthcare Providers:  SeriousBroker.it  This test is no t yet approved or cleared by the [place] FDA and  has been authorized for detection and/or diagnosis of SARS-CoV-2 by FDA under an Emergency Use Authorization (EUA). This EUA will remain  in effect (meaning this test can be used) for the duration of the COVID-19 declaration under Section 564(b)(1) of the Act, 21 U.S.C.section 360bbb-3(b)(1), unless the authorization is terminated  or revoked sooner.       Influenza A by PCR NEGATIVE NEGATIVE Final   Influenza B by PCR NEGATIVE NEGATIVE Final    Comment: (NOTE) The Xpert Xpress SARS-CoV-2/FLU/RSV plus assay is intended as an aid in the diagnosis of influenza from Nasopharyngeal swab specimens and should not be used as a sole basis for treatment. Nasal washings and aspirates are unacceptable for Xpert Xpress SARS-CoV-2/FLU/RSV testing.  Fact Sheet for Patients: BloggerCourse.com  Fact Sheet for Healthcare Providers: SeriousBroker.it  This test is not yet approved or cleared by the [place] FDA and has been authorized for detection and/or diagnosis of SARS-CoV-2 by FDA under an Emergency Use Authorization (EUA). This EUA will remain in effect (meaning this test can be used) for the duration of the COVID-19 declaration under Section 564(b)(1) of the Act, 21 U.S.C. section 360bbb-3(b)(1), unless the authorization is terminated or revoked.  Performed at [facility], [address] [place], [place] [address]   MRSA PCR Screening     Status: None   Collection Time: 11/28/20  6:55 AM   Specimen: Nasal Mucosa; Nasopharyngeal  Result Value Ref Range Status   MRSA by PCR NEGATIVE NEGATIVE Final    Comment:        The GeneXpert MRSA Assay (FDA approved for NASAL specimens only), is one component of a comprehensive MRSA colonization surveillance program. It is not intended to diagnose MRSA infection nor to guide or monitor treatment for MRSA infections. Performed at [facility], [address] [place], [place] [address]   Resp Panel by RT-PCR (Flu A&B, Covid) Nasopharyngeal Swab     Status: None   Collection Time: 12/03/20  9:01 PM   Specimen: Nasopharyngeal Swab; Nasopharyngeal(NP) swabs in vial transport medium  Result Value Ref Range Status   SARS Coronavirus 2 by RT PCR NEGATIVE NEGATIVE Final    Comment: (NOTE) SARS-CoV-2 target nucleic acids are NOT DETECTED.  The SARS-CoV-2 RNA is generally detectable in upper respiratory specimens during the acute phase of infection. The lowest concentration of SARS-CoV-2 viral copies this assay can detect is 138 copies/mL. A negative result  does not preclude SARS-Cov-2 infection and should not be used as the sole basis for treatment or other patient management decisions. A negative result may occur with  improper specimen collection/handling, submission of specimen other than nasopharyngeal swab, presence of viral mutation(s) within the areas targeted by this assay, and inadequate number of viral copies(<138 copies/mL). A negative result must be combined with clinical observations, patient history, and epidemiological information. The expected result is Negative.  Fact Sheet for Patients:  BloggerCourse.com  Fact Sheet for Healthcare Providers:  SeriousBroker.it  This test is no t yet approved or cleared by the [place] FDA and  has been  authorized for detection and/or diagnosis of SARS-CoV-2 by FDA under an Emergency Use Authorization (EUA). This EUA will remain  in effect (meaning this test can be used) for the duration of the COVID-19 declaration under Section 564(b)(1) of the Act, 21 U.S.C.section 360bbb-3(b)(1), unless the authorization is terminated  or revoked sooner.       Influenza A by PCR NEGATIVE NEGATIVE Final   Influenza B by PCR NEGATIVE NEGATIVE Final    Comment: (NOTE) The Xpert Xpress SARS-CoV-2/FLU/RSV plus assay is intended as an aid in the diagnosis of influenza from Nasopharyngeal swab specimens and should not be used as a sole basis for treatment. Nasal washings and aspirates are unacceptable for Xpert Xpress SARS-CoV-2/FLU/RSV testing.  Fact Sheet for Patients: BloggerCourse.com  Fact Sheet for Healthcare Providers: SeriousBroker.it  This test is not yet approved or cleared by the [place] FDA and has been authorized for detection and/or diagnosis of SARS-CoV-2 by FDA under an Emergency Use Authorization (EUA). This EUA will remain in effect (meaning this test can be used) for the duration of the COVID-19 declaration under Section 564(b)(1) of the Act, 21 U.S.C. section 360bbb-3(b)(1), unless the authorization is terminated or revoked.  Performed at [facility], [address] [place], [place] [address]      Labs: BNP (last 3 results) No results for input(s): BNP in the last 8760 hours. Basic Metabolic Panel: Recent Labs  Lab 11/30/20 0430 12/03/20 2049 12/04/20 0432 12/05/20 0239  NA 135 140 137 136  K 3.7 3.8 3.7 3.7  CL 106 108 103 105  CO2 22 19* 21* 21*  GLUCOSE 96 131* 123* 96  BUN CREATININE 0.89 1.09 1.02 0.93  CALCIUM 8.2* 9.1 8.6* 8.1*   Liver Function Tests: No results for input(s): AST, ALT, ALKPHOS, BILITOT, PROT, ALBUMIN in the last 168 hours. No results for input(s): LIPASE,  AMYLASE in the last 168 hours. No results for input(s): AMMONIA in the last 168 hours. CBC: Recent Labs  Lab 11/30/20 0430 12/03/20 2049 12/04/20 0432 12/05/20 0239 12/06/20 0337  WBC 8.8 9.6 11.2* 8.6 8.7  HGB 14.6 14.9 15.5 15.3 14.8  HCT 42.7 43.8 44.1 43.7 43.4  MCV 88.2 91.4 88.9 89.0 89.1  PLT 235 326 326 277 296   Cardiac Enzymes: No results for input(s): CKTOTAL, CKMB, CKMBINDEX, TROPONINI in the last 168 hours. BNP: Invalid input(s): POCBNP CBG: Recent Labs  Lab 12/03/20 2305  GLUCAP 146*   D-Dimer No results for input(s): DDIMER in the last 72 hours. Hgb A1c No results for input(s): HGBA1C in the last 72 hours. Lipid Profile No results for input(s): CHOL, HDL, LDLCALC, TRIG, CHOLHDL, LDLDIRECT in the last 72 hours. Thyroid function studies No results for input(s): TSH, T4TOTAL, T3FREE, THYROIDAB in the last 72 hours.  Invalid input(s): FREET3 Anemia work up No results for  input(s): VITAMINB12, FOLATE, FERRITIN, TIBC, IRON, RETICCTPCT in the last 72 hours. Urinalysis    Component Value Date/Time   COLORURINE YELLOW (A) 05/16/2020 0235   APPEARANCEUR HAZY (A) 05/16/2020 0235   LABSPEC 1.028 05/16/2020 0235   PHURINE 6.0 05/16/2020 0235   GLUCOSEU NEGATIVE 05/16/2020 0235   HGBUR NEGATIVE 05/16/2020 0235   BILIRUBINUR NEGATIVE 05/16/2020 0235   KETONESUR NEGATIVE 05/16/2020 0235   PROTEINUR NEGATIVE 05/16/2020 0235   NITRITE NEGATIVE 05/16/2020 0235   LEUKOCYTESUR NEGATIVE 05/16/2020 0235   Sepsis Labs Invalid input(s): PROCALCITONIN,  WBC,  LACTICIDVEN Microbiology Recent Results (from the past 240 hour(s))  Resp Panel by RT-PCR (Flu A&B, Covid) Nasopharyngeal Swab     Status: None   Collection Time: 11/28/20  5:08 AM   Specimen: Nasopharyngeal Swab; Nasopharyngeal(NP) swabs in vial transport medium  Result Value Ref Range Status   SARS Coronavirus 2 by RT PCR NEGATIVE NEGATIVE Final    Comment: (NOTE) SARS-CoV-2 target nucleic acids are NOT  DETECTED.  The SARS-CoV-2 RNA is generally detectable in upper respiratory specimens during the acute phase of infection. The lowest concentration of SARS-CoV-2 viral copies this assay can detect is 138 copies/mL. A negative result does not preclude SARS-Cov-2 infection and should not be used as the sole basis for treatment or other patient management decisions. A negative result may occur with  improper specimen collection/handling, submission of specimen other than nasopharyngeal swab, presence of viral mutation(s) within the areas targeted by this assay, and inadequate number of viral copies(<138 copies/mL). A negative result must be combined with clinical observations, patient history, and epidemiological information. The expected result is Negative.  Fact Sheet for Patients:  BloggerCourse.com  Fact Sheet for Healthcare Providers:  SeriousBroker.it  This test is no t yet approved or cleared by the [place] FDA and  has been authorized for detection and/or diagnosis of SARS-CoV-2 by FDA under an Emergency Use Authorization (EUA). This EUA will remain  in effect (meaning this test can be used) for the duration of the COVID-19 declaration under Section 564(b)(1) of the Act, 21 U.S.C.section 360bbb-3(b)(1), unless the authorization is terminated  or revoked sooner.       Influenza A by PCR NEGATIVE NEGATIVE Final   Influenza B by PCR NEGATIVE NEGATIVE Final    Comment: (NOTE) The Xpert Xpress SARS-CoV-2/FLU/RSV plus assay is intended as an aid in the diagnosis of influenza from Nasopharyngeal swab specimens and should not be used as a sole basis for treatment. Nasal washings and aspirates are unacceptable for Xpert Xpress SARS-CoV-2/FLU/RSV testing.  Fact Sheet for Patients: BloggerCourse.com  Fact Sheet for Healthcare Providers: SeriousBroker.it  This test is not yet  approved or cleared by the [place] FDA and has been authorized for detection and/or diagnosis of SARS-CoV-2 by FDA under an Emergency Use Authorization (EUA). This EUA will remain in effect (meaning this test can be used) for the duration of the COVID-19 declaration under Section 564(b)(1) of the Act, 21 U.S.C. section 360bbb-3(b)(1), unless the authorization is terminated or revoked.  Performed at [facility], [address] [place], [place] [address]   MRSA PCR Screening     Status: None   Collection Time: 11/28/20  6:55 AM   Specimen: Nasal Mucosa; Nasopharyngeal  Result Value Ref Range Status   MRSA by PCR NEGATIVE NEGATIVE Final    Comment:        The GeneXpert MRSA Assay (FDA approved for NASAL specimens only), is one component of a comprehensive MRSA colonization surveillance  program. It is not intended to diagnose MRSA infection nor to guide or monitor treatment for MRSA infections. Performed at [facility], [address] [place], [place] [address]   Resp Panel by RT-PCR (Flu A&B, Covid) Nasopharyngeal Swab     Status: None   Collection Time: 12/03/20  9:01 PM   Specimen: Nasopharyngeal Swab; Nasopharyngeal(NP) swabs in vial transport medium  Result Value Ref Range Status   SARS Coronavirus 2 by RT PCR NEGATIVE NEGATIVE Final    Comment: (NOTE) SARS-CoV-2 target nucleic acids are NOT DETECTED.  The SARS-CoV-2 RNA is generally detectable in upper respiratory specimens during the acute phase of infection. The lowest concentration of SARS-CoV-2 viral copies this assay can detect is 138 copies/mL. A negative result does not preclude SARS-Cov-2 infection and should not be used as the sole basis for treatment or other patient management decisions. A negative result may occur with  improper specimen collection/handling, submission of specimen other than nasopharyngeal swab, presence of viral mutation(s) within the areas targeted by this  assay, and inadequate number of viral copies(<138 copies/mL). A negative result must be combined with clinical observations, patient history, and epidemiological information. The expected result is Negative.  Fact Sheet for Patients:  BloggerCourse.com  Fact Sheet for Healthcare Providers:  SeriousBroker.it  This test is no t yet approved or cleared by the [place] FDA and  has been authorized for detection and/or diagnosis of SARS-CoV-2 by FDA under an Emergency Use Authorization (EUA). This EUA will remain  in effect (meaning this test can be used) for the duration of the COVID-19 declaration under Section 564(b)(1) of the Act, 21 U.S.C.section 360bbb-3(b)(1), unless the authorization is terminated  or revoked sooner.       Influenza A by PCR NEGATIVE NEGATIVE Final   Influenza B by PCR NEGATIVE NEGATIVE Final    Comment: (NOTE) The Xpert Xpress SARS-CoV-2/FLU/RSV plus assay is intended as an aid in the diagnosis of influenza from Nasopharyngeal swab specimens and should not be used as a sole basis for treatment. Nasal washings and aspirates are unacceptable for Xpert Xpress SARS-CoV-2/FLU/RSV testing.  Fact Sheet for Patients: BloggerCourse.com  Fact Sheet for Healthcare Providers: SeriousBroker.it  This test is not yet approved or cleared by the [place] FDA and has been authorized for detection and/or diagnosis of SARS-CoV-2 by FDA under an Emergency Use Authorization (EUA). This EUA will remain in effect (meaning this test can be used) for the duration of the COVID-19 declaration under Section 564(b)(1) of the Act, 21 U.S.C. section 360bbb-3(b)(1), unless the authorization is terminated or revoked.  Performed at [facility], [address] [place], [place] [address]     Time coordinating discharge: Over 30 minutes  SIGNED:  Arnetha Courser,  MD  [facility] 12/06/2020, 1:43 PM  If 7PM-7AM, please contact night-coverage [email or web address]  This record has been created using Conservation officer, historic buildings. Errors have been sought and corrected,but may not always be located. Such creation errors do not reflect on the standard of care.

## 2020-12-06 NOTE — Progress Notes (Signed)
Patient Name: Peter Page Date of Encounter: 12/06/2020  Hospital Problem List     Active Problems:   Acute anterior wall MI [facility])   STEMI (ST elevation myocardial infarction) [facility])   Chest pain    Patient Profile     60 year old male with history of coronary disease status post recent anterior myocardial infarction with placement of a drug-eluting stent in the LAD on 11/28/2020. Cardiac cath at that time showed two-vessel coronary disease with an ulcerated 99% stenosis in the proximal to mid LAD and a 75% stenosis in the OM 3 with an EF mildly reduced. He had moderate to severe MR at the time. He had a resolute Onyx 2.75 x 18 mm stent placed in the proximal to mid LAD. He was discharged on Brilinta. He returned to the ER yesterday with recurrent chest pain. EKG suggested anterior STEMI. Cardiac cath done urgently revealed 100% in-stent thrombosis with again a 70% stenosis in the third marginal and severe disease in the RCA. Patient underwent balloon angioplasty with a 3.75 x 12 mm balloon. Was taken up to 3.91 mm. He had elevated LVEDP. He was placed back on Brilinta. It is unclear whether he was compliant with his Brilinta which he had been discharged on several days earlier.  Subjective   No chest pain  Inpatient Medications    . aspirin  81 mg Oral Daily  . atorvastatin  80 mg Oral Daily  . Chlorhexidine Gluconate Cloth  6 each Topical Daily  . famotidine  20 mg Oral BID  . feeding supplement  237 mL Oral TID BM  . multivitamin with minerals  1 tablet Oral Daily  . sodium chloride flush  3 mL Intravenous Q12H  . ticagrelor  90 mg Oral BID    Vital Signs    Vitals:   12/06/20 0200 12/06/20 0300 12/06/20 0400 12/06/20 0500  BP: 114/81 (!) 136/93 130/76 (!) 142/80  Pulse: 71 78 75 81  Resp: (!) 22 17 (!) 22 (!) 25  Temp:   98 F (36.7 C)   TempSrc:   Oral   SpO2: 93% 94% 94% 94%  Weight:      Height:        Intake/Output Summary (Last 24 hours) at  12/06/2020 0750 Last data filed at 12/06/2020 0400 Gross per 24 hour  Intake 270.82 ml  Output 975 ml  Net -704.18 ml   Filed Weights   12/03/20 2043  Weight: 53.4 kg    Physical Exam    GEN: Well nourished, well developed, in no acute distress.  HEENT: normal.  Neck: Supple, no JVD, carotid bruits, or masses. Cardiac: RRR, no murmurs, rubs, or gallops. No clubbing, cyanosis, edema.  Radials/DP/PT 2+ and equal bilaterally.  Respiratory:  Respirations regular and unlabored, clear to auscultation bilaterally. GI: Soft, nontender, nondistended, BS + x 4. MS: no deformity or atrophy. Skin: warm and dry, no rash. Neuro:  Strength and sensation are intact. Psych: Normal affect.  Labs    CBC Recent Labs    12/05/20 0239 12/06/20 0337  WBC 8.6 8.7  HGB 15.3 14.8  HCT 43.7 43.4  MCV 89.0 89.1  PLT 277 296   Basic Metabolic Panel Recent Labs    59/93/57 0432 12/05/20 0239  NA 137 136  K 3.7 3.7  CL 103 105  CO2 21* 21*  GLUCOSE 123* 96  BUN 17 16  CREATININE 1.02 0.93  CALCIUM 8.6* 8.1*   Liver Function Tests No results for input(s):  AST, ALT, ALKPHOS, BILITOT, PROT, ALBUMIN in the last 72 hours. No results for input(s): LIPASE, AMYLASE in the last 72 hours. Cardiac Enzymes No results for input(s): CKTOTAL, CKMB, CKMBINDEX, TROPONINI in the last 72 hours. BNP No results for input(s): BNP in the last 72 hours. D-Dimer No results for input(s): DDIMER in the last 72 hours. Hemoglobin A1C No results for input(s): HGBA1C in the last 72 hours. Fasting Lipid Panel No results for input(s): CHOL, HDL, LDLCALC, TRIG, CHOLHDL, LDLDIRECT in the last 72 hours. Thyroid Function Tests No results for input(s): TSH, T4TOTAL, T3FREE, THYROIDAB in the last 72 hours.  Invalid input(s): FREET3  Telemetry    nsr  ECG    nsr with evolving anterior mi  Radiology    CARDIAC CATHETERIZATION  Addendum Date: 12/03/2020    3rd Mrg lesion is 75% stenosed.  Prox RCA-1 lesion is  30% stenosed.  Prox RCA-2 lesion is 20% stenosed.  Mid LAD lesion is 100% stenosed. Stent thrombosis of LAD stent placed earlier in the week.  Thombectomy restored TIMI 3 flow. Then IVUS showed the prior stent was undeployed. Balloon angioplasty was performed first with a 3.25 mm balloon. The thrombus decreased significantly.  Final Balloon angioplasty was performed using a BALLOON [place] TREK RX 3.75X12, up to 3.91 mm. Sizing was optimized with IVUS.  Post intervention, there is a 0% residual stenosis.  There is no aortic valve stenosis.  LV end diastolic pressure is severely elevated.  Patient was reloaded with Brilinta.  There was some question as to whether he was taking the Brilinta once a day or twice a day at home.  Apparently, it was also an issue at the end of his last hospitalization where he felt all of the medicines were making him sick.  We will have to try to get history from family members to see whether or not he truly was taking his medicines.  Regardless, stent size was optimized with larger postdilatation balloons.  I tried to avoid additional stent placement given the potential issues that he had with taking antiplatelet therapy. Elevated LVEDP.  IV Lasix was given.  He will need medical therapy for LV dysfunction most likely. I did leave a message for his daughter who is listed as emergency contact with his location.   Result Date: 12/03/2020  3rd Mrg lesion is 75% stenosed.  Prox RCA-1 lesion is 30% stenosed.  Prox RCA-2 lesion is 20% stenosed.  Mid LAD lesion is 100% stenosed. Stent thrombosis of LAD stent placed earlier in the week.  Thombectomy restored TIMI 3 flow. Then IVUS showed the prior stent was undeployed. Balloon angioplasty was performed first with a 3.25 mm balloon. The thrombus decreased significantly.  Final Balloon angioplasty was performed using a BALLOON [place] TREK RX 3.75X12, up to 3.91 mm. Sizing was optimized with IVUS.  Post intervention, there is a 0% residual  stenosis.  There is no aortic valve stenosis.  LV end diastolic pressure is severely elevated.  Patient was reloaded with Brilinta.  There was some question as to whether he was taking the Brilinta once a day or twice a day at home.  Apparently, it was also an issue at the end of his last hospitalization where he felt all of the medicines were making him sick.  We will have to try to get history from family members to see whether or not he truly was taking his medicines.  Regardless, stent size was optimized with larger postdilatation balloons. Elevated LVEDP.  IV Lasix  was given.  He will need medical therapy for LV dysfunction most likely.   CARDIAC CATHETERIZATION  Result Date: 11/28/2020  Prox RCA-1 lesion is 30% stenosed.  Prox RCA-2 lesion is 20% stenosed.  3rd Mrg lesion is 75% stenosed.  Prox LAD to Mid LAD lesion is 99% stenosed.  A drug-eluting stent was successfully placed using a STENT RESOLUTE ONYX [id number].  Post intervention, there is a 0% residual stenosis.  There is moderate (3+) mitral regurgitation.  1.  Anterior STEMI 2.  Two-vessel coronary artery disease with ulcerated 99% stenosis proximal/mid LAD and 75% stenosis OM 3 3.  Mildly reduced left ventricular function with anteroapical akinesis 4.  Moderate to severe mitral regurgitation 5.  Successful primary PCI with DES proximal/mid LAD Recommendations 1.  Dual antiplatelet therapy uninterrupted for 1 year 2.  High intensity atorvastatin 80 mg daily 3.  Metoprolol tartrate 25 mg twice daily 4.  2D echocardiogram   DG Chest Portable 1 View  Result Date: 12/03/2020 CLINICAL DATA:  Left-sided chest pain. EXAM: PORTABLE CHEST 1 VIEW COMPARISON:  Nov 28, 2020 FINDINGS: The lungs are hyperinflated. Diffuse, chronic appearing increased interstitial lung markings are seen. Stable areas of bilateral upper lobe emphysematous lung disease and biapical architectural distortion are noted. There is no evidence of a pleural effusion or  pneumothorax. The heart size and mediastinal contours are within normal limits. Both lungs are clear. The visualized skeletal structures are unremarkable. IMPRESSION: COPD without acute cardiopulmonary disease. Electronically Signed   By: Aram Candela M.D.   On: 12/03/2020 21:41   DG Chest Portable 1 View  Result Date: 11/28/2020 CLINICAL DATA:  ST elevation myocardial infarction EXAM: PORTABLE CHEST 1 VIEW COMPARISON:  05/15/2020 FINDINGS: Hyperinflation and interstitial coarsening with biapical architectural distortion. There is hazy density bilaterally, nodule not excluded. Artifact from EKG leads and defibrillator pads. Normal heart size and stable aortic contours. IMPRESSION: 1. Recommend follow-up chest CT to evaluate hazy density at the apices. 2. Emphysema. 3. No acute finding related to the history of myocardial infarction. Electronically Signed   By: Marnee Spring M.D.   On: 11/28/2020 05:19   ECHOCARDIOGRAM COMPLETE  Result Date: 12/05/2020    ECHOCARDIOGRAM REPORT   Patient Name:   Peter Page Date of Exam: 12/05/2020 Medical Rec #:  [id number]       Height:       69.0 in Accession #:    [id number]      Weight:       117.7 lb Date of Birth:  01-26-1961       BSA:          1.649 m Patient Age:    60 years        BP:           122/77 mmHg Patient Gender: M               HR:           77 bpm. Exam Location:  [facility] Procedure: 2D Echo, Cardiac Doppler, Color Doppler and Strain Analysis Indications:     Acute myocardial infarction -unspecified I21.9  History:         Patient has prior history of Echocardiogram examinations, most                  recent 11/28/2020. COPD.  Sonographer:     Cristela Blue RDCS (AE) Referring Phys:  Harold Hedge MD Diagnosing Phys: Marcina Millard MD  Sonographer Comments: Global longitudinal strain was attempted.  IMPRESSIONS  1. Left ventricular ejection fraction, by estimation, is 35 to 40%. The left ventricle has moderately decreased function. The left ventricle  demonstrates regional wall motion abnormalities (see scoring diagram/findings for description). Left ventricular  diastolic parameters are consistent with Grade I diastolic dysfunction (impaired relaxation).  2. Right ventricular systolic function is normal. The right ventricular size is normal.  3. The mitral valve is normal in structure. Trivial mitral valve regurgitation. No evidence of mitral stenosis.  4. The aortic valve is normal in structure. Aortic valve regurgitation is not visualized. No aortic stenosis is present.  5. The inferior vena cava is normal in size with greater than 50% respiratory variability, suggesting right atrial pressure of 3 mmHg. FINDINGS  Left Ventricle: Left ventricular ejection fraction, by estimation, is 35 to 40%. The left ventricle has moderately decreased function. The left ventricle demonstrates regional wall motion abnormalities. The left ventricular internal cavity size was normal in size. There is no left ventricular hypertrophy. Left ventricular diastolic parameters are consistent with Grade I diastolic dysfunction (impaired relaxation).  LV Wall Scoring: The mid and distal anterior wall, mid and distal anterior septum, apical lateral segment, mid inferoseptal segment, and apex are akinetic. The basal anteroseptal segment, basal anterior segment, and basal inferoseptal segment are hypokinetic. Right Ventricle: The right ventricular size is normal. No increase in right ventricular wall thickness. Right ventricular systolic function is normal. Left Atrium: Left atrial size was normal in size. Right Atrium: Right atrial size was normal in size. Pericardium: There is no evidence of pericardial effusion. Mitral Valve: The mitral valve is normal in structure. Trivial mitral valve regurgitation. No evidence of mitral valve stenosis. Tricuspid Valve: The tricuspid valve is normal in structure. Tricuspid valve regurgitation is trivial. No evidence of tricuspid stenosis. Aortic Valve:  The aortic valve is normal in structure. Aortic valve regurgitation is not visualized. No aortic stenosis is present. Aortic valve mean gradient measures 2.0 mmHg. Aortic valve peak gradient measures 3.2 mmHg. Aortic valve area, by VTI measures 2.43 cm. Pulmonic Valve: The pulmonic valve was normal in structure. Pulmonic valve regurgitation is not visualized. No evidence of pulmonic stenosis. Aorta: The aortic root is normal in size and structure. Venous: The inferior vena cava is normal in size with greater than 50% respiratory variability, suggesting right atrial pressure of 3 mmHg. IAS/Shunts: No atrial level shunt detected by color flow Doppler.  LEFT VENTRICLE PLAX 2D LVIDd:         4.23 cm     Diastology LVIDs:         3.51 cm     LV e' medial:    5.22 cm/s LV PW:         1.01 cm     LV E/e' medial:  10.7 LV IVS:        0.75 cm     LV e' lateral:   6.96 cm/s LVOT diam:     2.10 cm     LV E/e' lateral: 8.0 LV SV:         36 LV SV Index:   22 LVOT Area:     3.46 cm  LV Volumes (MOD) LV vol d, MOD A2C: 77.6 ml LV vol d, MOD A4C: 74.4 ml LV vol s, MOD A2C: 50.8 ml LV vol s, MOD A4C: 58.8 ml LV SV MOD A2C:     26.8 ml LV SV MOD A4C:     74.4 ml LV SV MOD BP:      20.6 ml  RIGHT VENTRICLE RV Basal diam:  2.25 cm RV S prime:     12.20 cm/s TAPSE (M-mode): 3.3 cm LEFT ATRIUM           Index       RIGHT ATRIUM          Index LA diam:      3.00 cm 1.82 cm/m  RA Area:     8.55 cm LA Vol (A2C): 38.7 ml 23.47 ml/m RA Volume:   17.40 ml 10.55 ml/m LA Vol (A4C): 38.0 ml 23.04 ml/m  AORTIC VALVE                   PULMONIC VALVE AV Area (Vmax):    2.67 cm    PV Vmax:        1.02 m/s AV Area (Vmean):   2.36 cm    PV Peak grad:   4.2 mmHg AV Area (VTI):     2.43 cm    RVOT Peak grad: 6 mmHg AV Vmax:           89.65 cm/s AV Vmean:          64.400 cm/s AV VTI:            0.148 m AV Peak Grad:      3.2 mmHg AV Mean Grad:      2.0 mmHg LVOT Vmax:         69.20 cm/s LVOT Vmean:        43.900 cm/s LVOT VTI:          0.104 m  LVOT/AV VTI ratio: 0.70  AORTA Ao Root diam: 3.73 cm MITRAL VALVE               TRICUSPID VALVE MV Area (PHT): 4.74 cm    TR Peak grad:   20.1 mmHg MV Decel Time: 160 msec    TR Vmax:        224.00 cm/s MV E velocity: 55.70 cm/s MV A velocity: 72.40 cm/s  SHUNTS MV E/A ratio:  0.77        Systemic VTI:  0.10 m                            Systemic Diam: 2.10 cm Marcina Millard MD Electronically signed by Marcina Millard MD Signature Date/Time: 12/05/2020/3:19:37 PM    Final    ECHOCARDIOGRAM COMPLETE  Result Date: 11/28/2020    ECHOCARDIOGRAM REPORT   Patient Name:   Peter Page Date of Exam: 11/28/2020 Medical Rec #:  [id number]       Height:       69.0 in Accession #:    [id number]      Weight:       122.4 lb Date of Birth:  1960/10/26       BSA:          1.677 m Patient Age:    60 years        BP:           129/76 mmHg Patient Gender: M               HR:           61 bpm. Exam Location:  [facility] Procedure: 2D Echo and Strain Analysis Indications:     AMI I21.9  History:         Patient has prior history of Echocardiogram examinations, most  recent 05/17/2020.  Sonographer:     Overton Mam RDCS Referring Phys:  [id number] Lyn Hollingshead PARASCHOS Diagnosing Phys: Marcina Millard MD  Sonographer Comments: Global longitudinal strain was attempted. IMPRESSIONS  1. Left ventricular ejection fraction, by estimation, is 40 to 45%. The left ventricle has mildly decreased function. The left ventricle demonstrates regional wall motion abnormalities (see scoring diagram/findings for description). Left ventricular diastolic parameters were normal.  2. Right ventricular systolic function is normal. The right ventricular size is normal.  3. The mitral valve is normal in structure. Mild mitral valve regurgitation. No evidence of mitral stenosis.  4. Tricuspid valve regurgitation is mild to moderate.  5. The aortic valve is normal in structure. Aortic valve regurgitation is not visualized. No aortic  stenosis is present.  6. The inferior vena cava is normal in size with greater than 50% respiratory variability, suggesting right atrial pressure of 3 mmHg. FINDINGS  Left Ventricle: Left ventricular ejection fraction, by estimation, is 40 to 45%. The left ventricle has mildly decreased function. The left ventricle demonstrates regional wall motion abnormalities. The left ventricular internal cavity size was normal in size. There is no left ventricular hypertrophy. Left ventricular diastolic parameters were normal.  LV Wall Scoring: The entire apex is akinetic. Right Ventricle: The right ventricular size is normal. No increase in right ventricular wall thickness. Right ventricular systolic function is normal. Left Atrium: Left atrial size was normal in size. Right Atrium: Right atrial size was normal in size. Pericardium: There is no evidence of pericardial effusion. Mitral Valve: The mitral valve is normal in structure. Mild mitral valve regurgitation. No evidence of mitral valve stenosis. Tricuspid Valve: The tricuspid valve is normal in structure. Tricuspid valve regurgitation is mild to moderate. No evidence of tricuspid stenosis. Aortic Valve: The aortic valve is normal in structure. Aortic valve regurgitation is not visualized. No aortic stenosis is present. Aortic valve peak gradient measures 3.6 mmHg. Pulmonic Valve: The pulmonic valve was normal in structure. Pulmonic valve regurgitation is not visualized. No evidence of pulmonic stenosis. Aorta: The aortic root is normal in size and structure. Venous: The inferior vena cava is normal in size with greater than 50% respiratory variability, suggesting right atrial pressure of 3 mmHg. IAS/Shunts: No atrial level shunt detected by color flow Doppler.  LEFT VENTRICLE PLAX 2D LVIDd:         4.34 cm     Diastology LVIDs:         3.37 cm     LV e' medial:    7.51 cm/s LV PW:         1.07 cm     LV E/e' medial:  11.0 LV IVS:        0.91 cm     LV e' lateral:   9.36  cm/s LVOT diam:     2.20 cm     LV E/e' lateral: 8.8 LV SV:         55 LV SV Index:   33 LVOT Area:     3.80 cm  LV Volumes (MOD) LV vol d, MOD A2C: 75.2 ml LV vol d, MOD A4C: 92.0 ml LV vol s, MOD A2C: 36.9 ml LV vol s, MOD A4C: 45.1 ml LV SV MOD A2C:     38.3 ml LV SV MOD A4C:     92.0 ml LV SV MOD BP:      48.7 ml RIGHT VENTRICLE RV Basal diam:  2.99 cm RV S prime:     8.49 cm/s TAPSE (  M-mode): 1.5 cm LEFT ATRIUM             Index       RIGHT ATRIUM          Index LA diam:        3.40 cm 2.03 cm/m  RA Area:     7.82 cm LA Vol (A2C):   30.0 ml 17.89 ml/m RA Volume:   14.10 ml 8.41 ml/m LA Vol (A4C):   25.6 ml 15.27 ml/m LA Biplane Vol: 30.1 ml 17.95 ml/m  AORTIC VALVE                PULMONIC VALVE AV Area (Vmax): 2.90 cm    PV Vmax:       0.76 m/s AV Vmax:        95.00 cm/s  PV Peak grad:  2.3 mmHg AV Peak Grad:   3.6 mmHg LVOT Vmax:      72.50 cm/s LVOT Vmean:     47.400 cm/s LVOT VTI:       0.145 m  AORTA Ao Root diam: 3.20 cm MITRAL VALVE               TRICUSPID VALVE MV Area (PHT): 4.19 cm    TV Peak grad:   23.3 mmHg MV Decel Time: 181 msec    TV Vmax:        2.42 m/s MV E velocity: 82.30 cm/s MV A velocity: 51.00 cm/s  SHUNTS MV E/A ratio:  1.61        Systemic VTI:  0.14 m                            Systemic Diam: 2.20 cm Marcina Millard MD Electronically signed by Marcina Millard MD Signature Date/Time: 11/28/2020/1:19:07 PM    Final     Assessment & Plan     60 year old male with history of coronary disease status post LAD STEMI earlier this week with placement of a drug-eluting stent. Now had recurrence with acute in-stent restenosis. Unclear whether the patient was compliant with his Brilinta. Stent was upsized by balloon .  1. Coronary disease-status post recurrent anterior myocardial infarction due to in-stent restenosis. May have been secondary to noncompliance with Brilinta. Has been placed back on this. Will have again stressed  compliance with this. We will hold  beta-blocker due to soft blood pressure and follow. Echo revealed ef of 35-40% with anteroapical and anterolateral akinesis.  Will place on toprol 25 mg daily as pressure has improved. Will also need an ace I added as out patient if pressure tolerates the beta blocker. Will ambulate in the hall and if stable, consider discharge to home on asa 81 mg daily, brilinta 90 bid, atorvastatin 80 mg daily and toprol 25 daily.   Signed, Darlin Priestly Raevin Wierenga MD 12/06/2020, 7:50 AM  Pager: [phone]

## 2022-09-17 IMAGING — DX DG CHEST 1V PORT
2 series · 2 of 2 positions shown · non-contrast
Comparison: None.

CLINICAL DATA: Syncope, dizziness

EXAM:
PORTABLE CHEST 1 VIEW

[chest ap (1 of 2)]
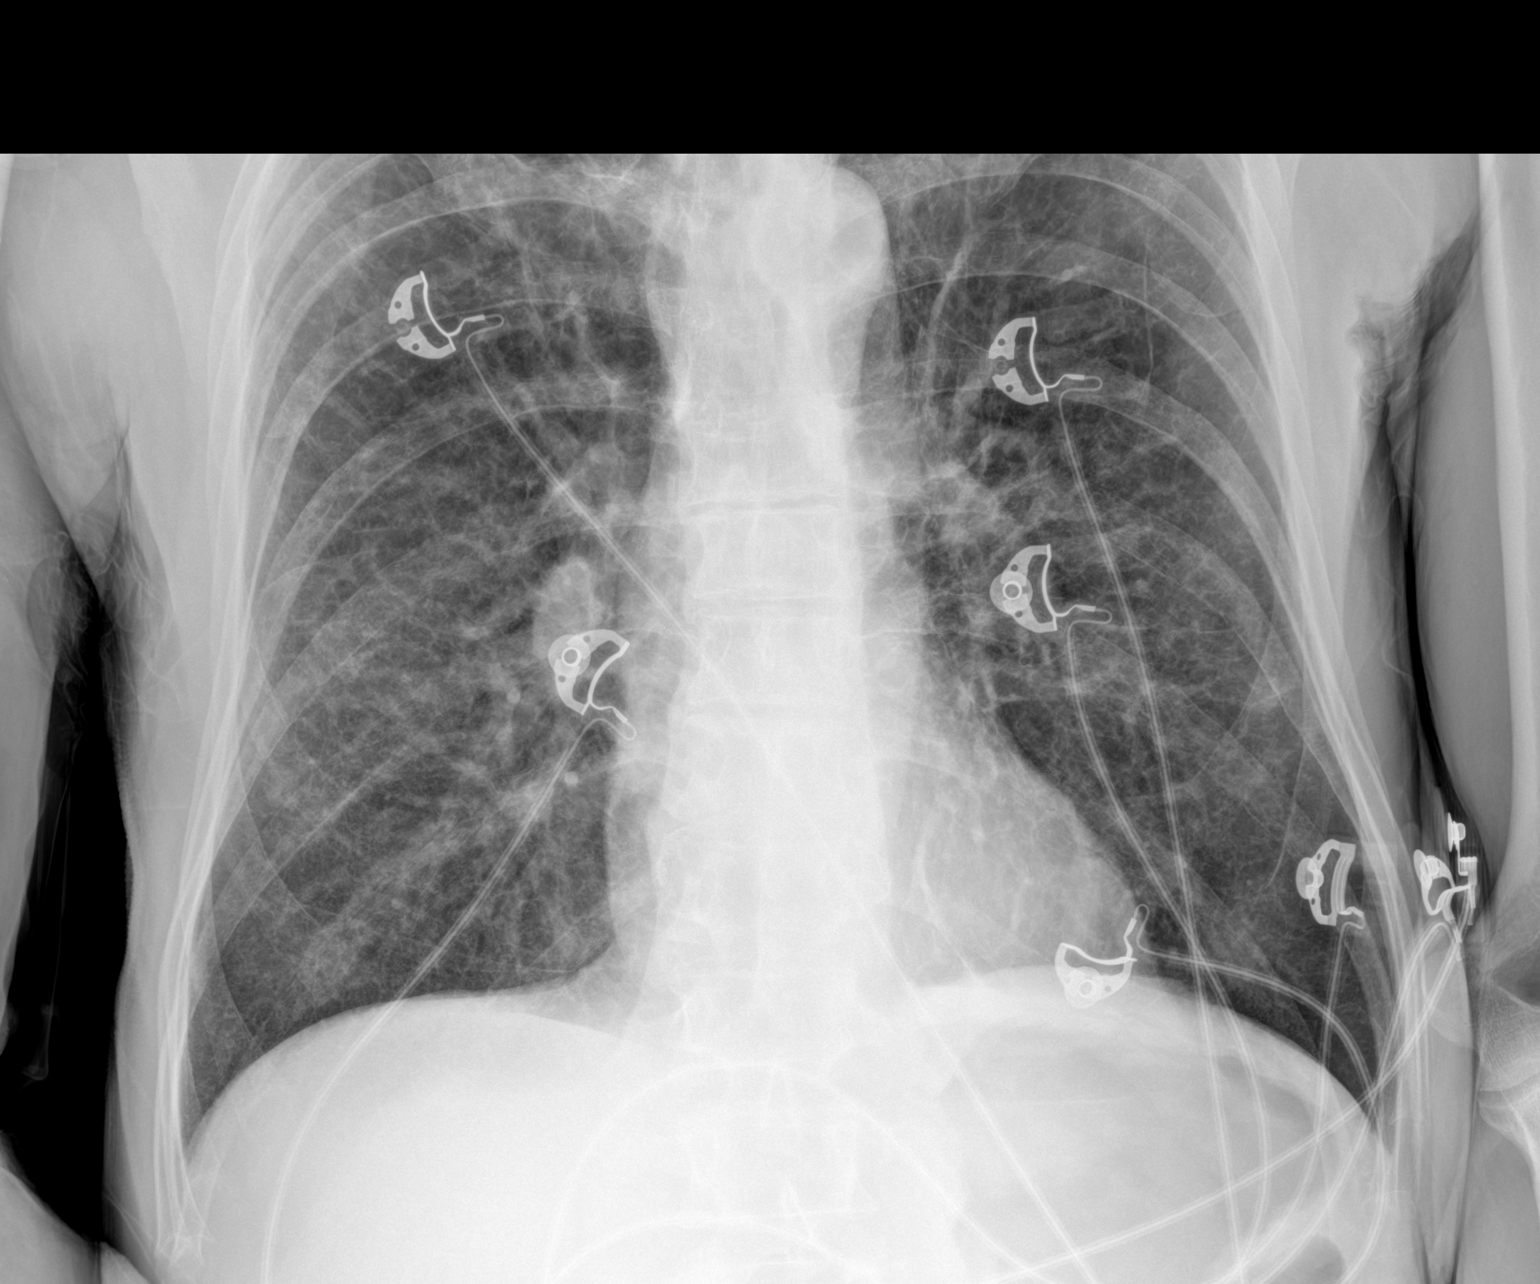

[chest ap (2 of 2)]
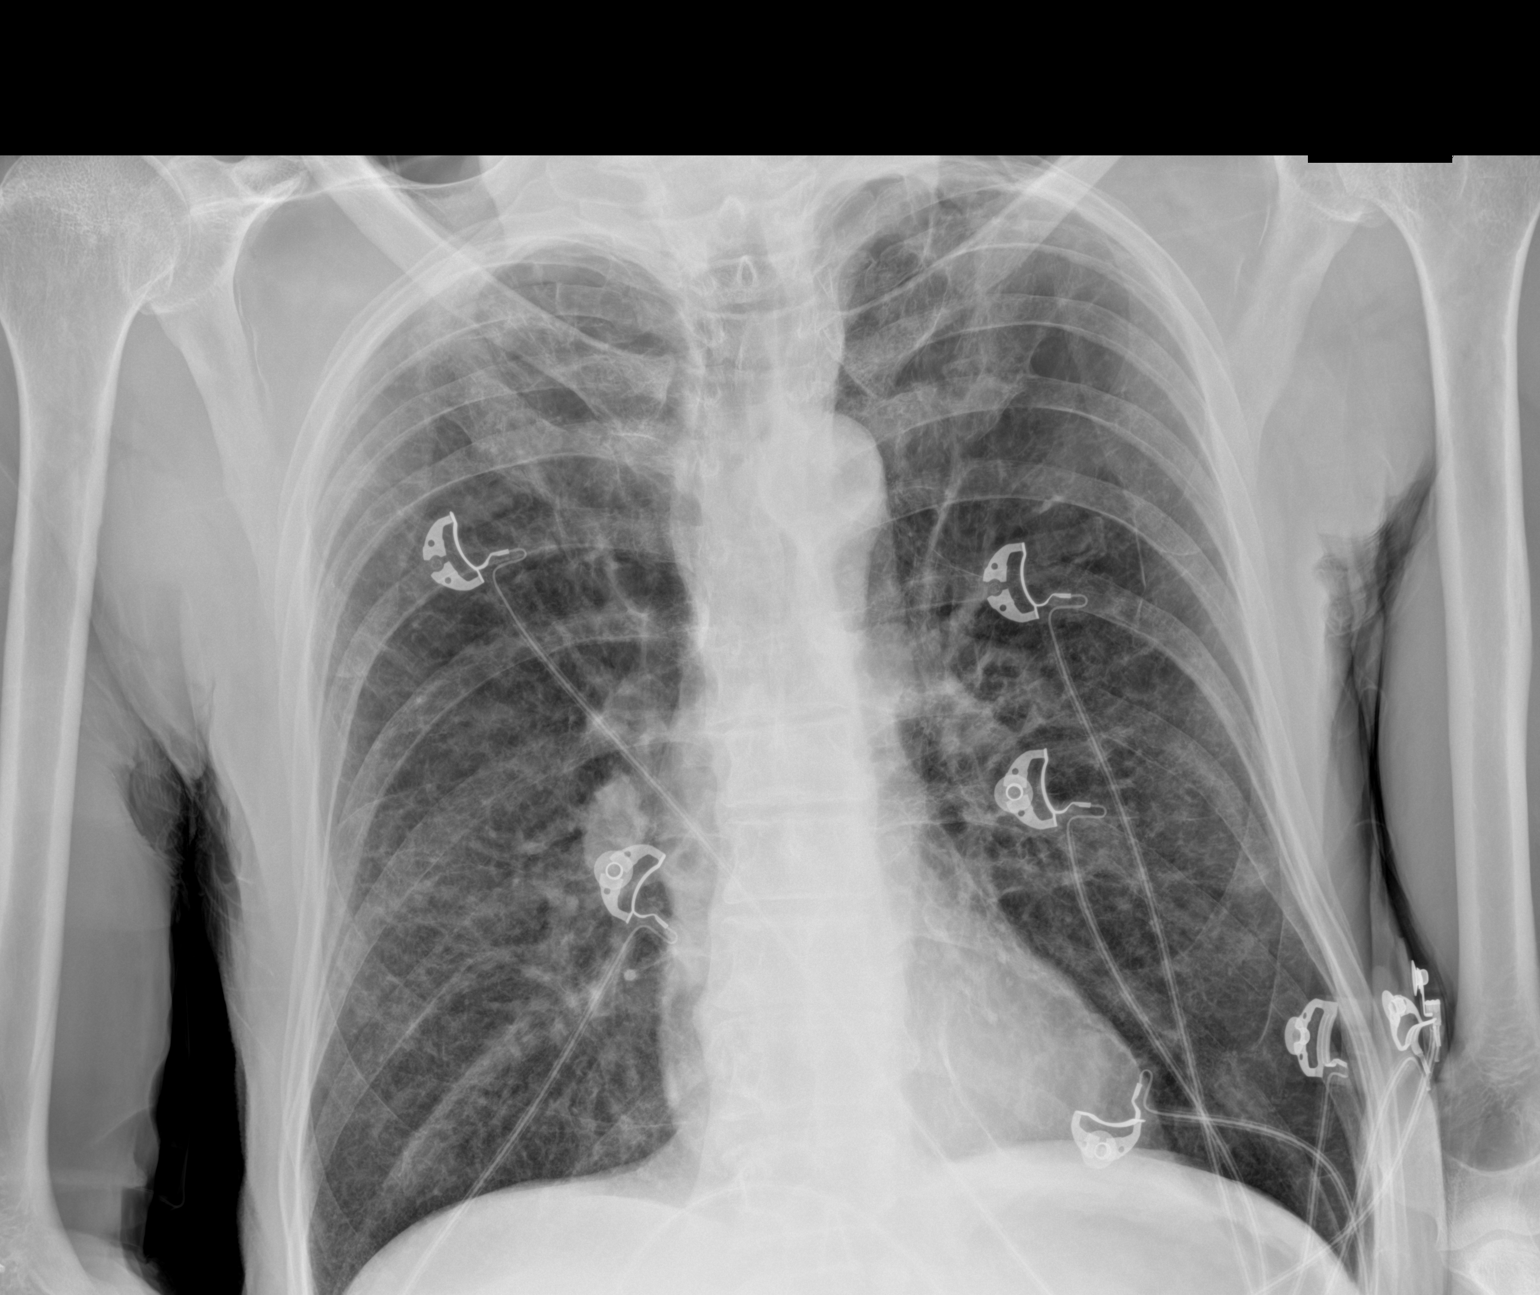

[2 of 2 positions shown; findings below may reference images not displayed]

FINDINGS: 2 frontal views of the chest demonstrate an unremarkable cardiac
silhouette. Diffuse interstitial prominence and scarring throughout
the lungs compatible with emphysema. No airspace disease, effusion,
or pneumothorax. No acute bony abnormalities.
IMPRESSION: 1. Emphysema.  No acute airspace disease.

## 2022-09-18 IMAGING — US US CAROTID DUPLEX BILAT
1 series · 13 of 24 positions shown · non-contrast
Comparison: CTA neck 05/16/2020

CLINICAL DATA: Stroke.

EXAM:
BILATERAL CAROTID DUPLEX ULTRASOUND
TECHNIQUE: Gray scale imaging, color Doppler and duplex ultrasound were
performed of bilateral carotid and vertebral arteries in the neck.

[Series 1: us carotid bilateral · 13 of 64 slices shown]
[im 1/64]
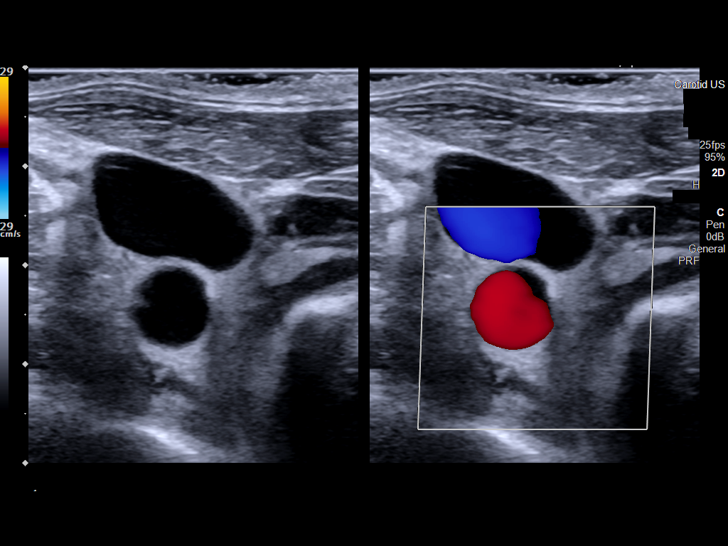
[im 6/64]
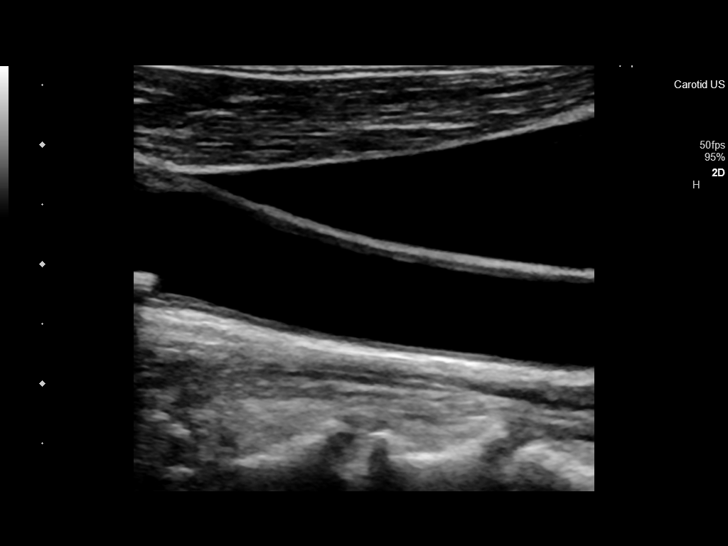
[im 11/64]
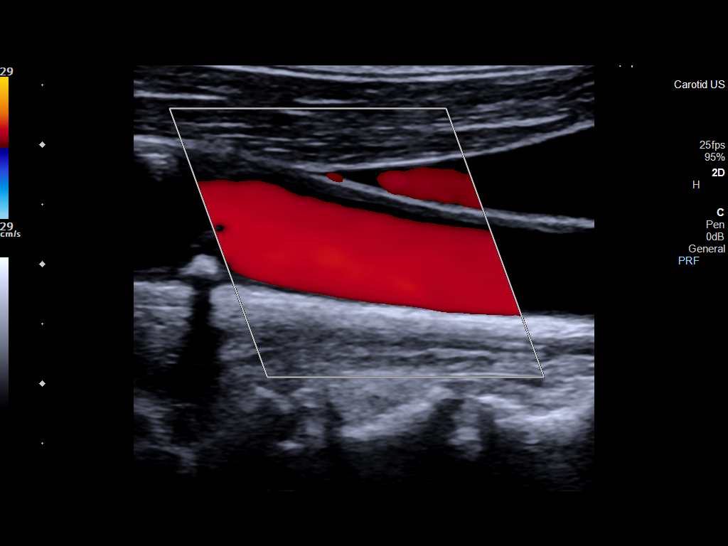
[im 17/64]
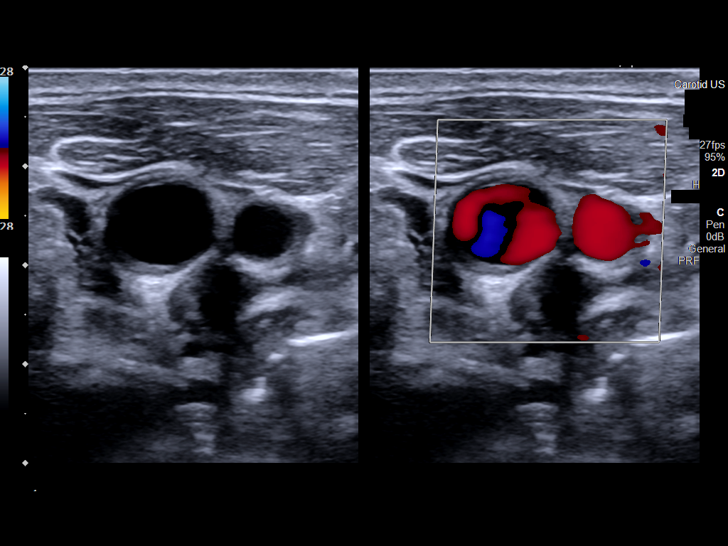
[im 22/64]
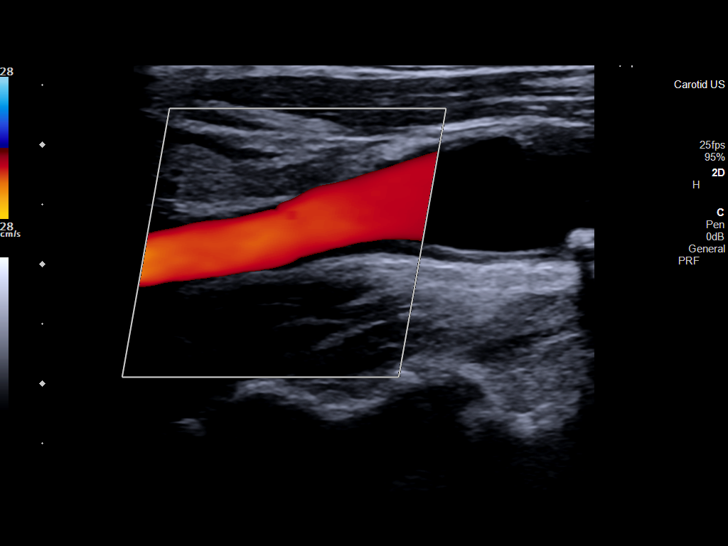
[im 28/64]
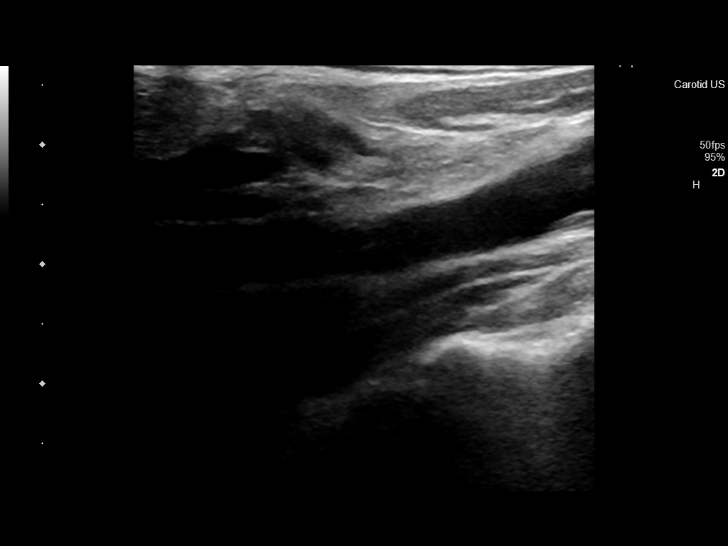
[im 33/64]
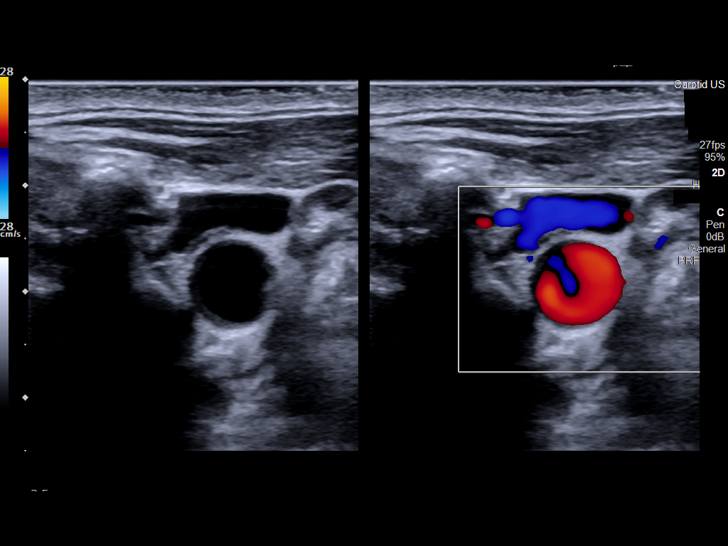
[im 36/64]
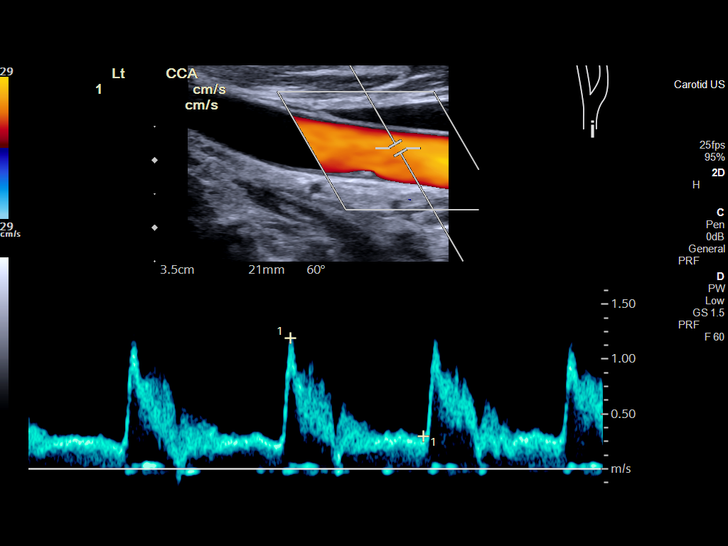
[im 42/64]
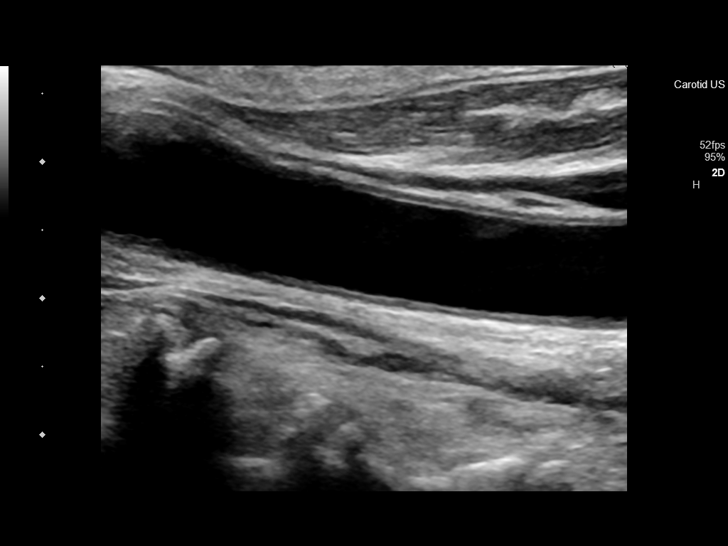
[im 47/64]
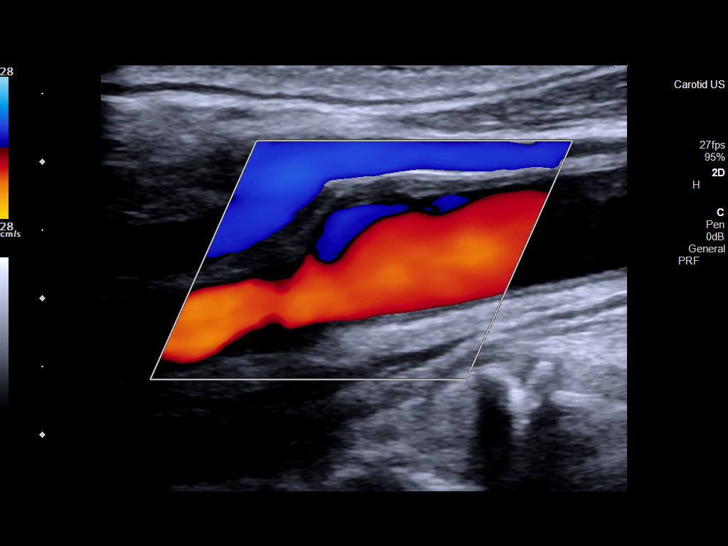
[im 53/64]
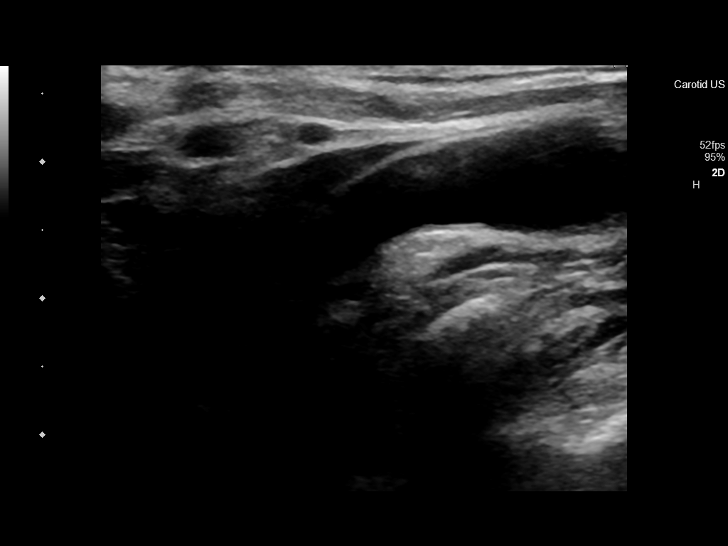
[im 58/64]
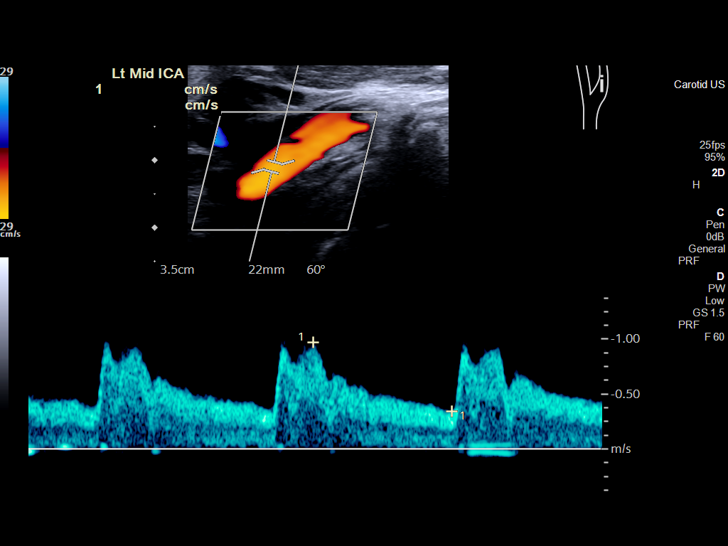
[im 64/64]
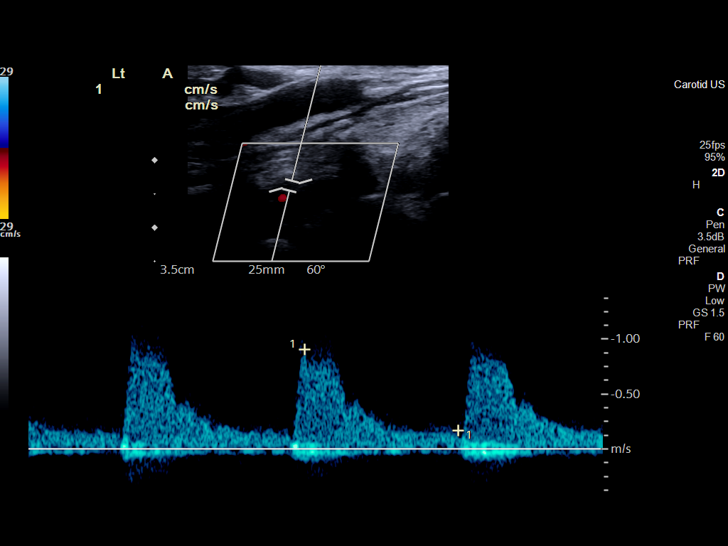

[13 of 24 positions shown; findings below may reference images not displayed]

FINDINGS: Criteria: Quantification of carotid stenosis is based on velocity
parameters that correlate the residual internal carotid diameter
with NASCET-based stenosis levels, using the diameter of the distal
internal carotid lumen as the denominator for stenosis measurement.

The following velocity measurements were obtained:

RIGHT

ICA: 87/29 cm/sec

CCA: 69/18 cm/sec

SYSTOLIC ICA/CCA RATIO:

ECA: 89 cm/sec

LEFT

ICA: 111/38 cm/sec

CCA: 84/21 cm/sec

SYSTOLIC ICA/CCA RATIO:

ECA: 106 cm/sec

RIGHT CAROTID ARTERY: Small amount of plaque in the distal common
carotid artery and carotid bulb. External carotid artery is patent
with normal waveform. Small amount of plaque in the proximal
internal carotid artery. Normal waveforms and velocities in the
internal carotid artery.

RIGHT VERTEBRAL ARTERY: Antegrade flow and normal waveform in the
right vertebral artery.

LEFT CAROTID ARTERY: Intimal thickening at the left carotid bulb.
External carotid artery is patent with normal waveform. Normal
waveforms and velocities in the internal carotid artery.

LEFT VERTEBRAL ARTERY: Antegrade flow and normal waveform in the
left vertebral artery.
IMPRESSION: 1. Small amount of atherosclerotic plaque involving the carotid
arteries, most prominent at the right carotid bulb region. Estimated
degree of stenosis in the internal carotid arteries is less than 50%
bilaterally.
2. Patent vertebral arteries with antegrade flow.
